# Patient Record
Sex: Female | Born: 1965
Health system: Southern US, Community
[De-identification: ages and names within clinical notes are randomized; demographics above are authoritative.]

## PROBLEM LIST (undated history)

## (undated) DIAGNOSIS — I2510 Atherosclerotic heart disease of native coronary artery without angina pectoris: Secondary | ICD-10-CM

## (undated) DIAGNOSIS — N611 Abscess of the breast and nipple: Secondary | ICD-10-CM

## (undated) DIAGNOSIS — K219 Gastro-esophageal reflux disease without esophagitis: Secondary | ICD-10-CM

## (undated) DIAGNOSIS — G809 Cerebral palsy, unspecified: Secondary | ICD-10-CM

## (undated) DIAGNOSIS — Z87898 Personal history of other specified conditions: Secondary | ICD-10-CM

## (undated) DIAGNOSIS — R001 Bradycardia, unspecified: Secondary | ICD-10-CM

## (undated) DIAGNOSIS — R7303 Prediabetes: Secondary | ICD-10-CM

## (undated) DIAGNOSIS — E785 Hyperlipidemia, unspecified: Secondary | ICD-10-CM

## (undated) DIAGNOSIS — I10 Essential (primary) hypertension: Secondary | ICD-10-CM

## (undated) DIAGNOSIS — R079 Chest pain, unspecified: Secondary | ICD-10-CM

## (undated) DIAGNOSIS — F319 Bipolar disorder, unspecified: Secondary | ICD-10-CM

## (undated) HISTORY — DX: Essential (primary) hypertension: I10

## (undated) HISTORY — PX: OTHER SURGICAL HISTORY: SHX169

## (undated) HISTORY — PX: CORONARY ANGIOPLASTY WITH STENT PLACEMENT: SHX49

## (undated) HISTORY — DX: Cerebral palsy, unspecified: G80.9

## (undated) HISTORY — DX: Hyperlipidemia, unspecified: E78.5

## (undated) HISTORY — PX: TUBAL LIGATION: SHX77

## (undated) HISTORY — DX: Bipolar disorder, unspecified: F31.9

## (undated) HISTORY — DX: Personal history of other specified conditions: Z87.898

## (undated) HISTORY — DX: Gastro-esophageal reflux disease without esophagitis: K21.9

## (undated) HISTORY — DX: Atherosclerotic heart disease of native coronary artery without angina pectoris: I25.10

---
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
## 1998-08-18 ENCOUNTER — Encounter: Payer: Self-pay | Admitting: Emergency Medicine

## 1998-08-18 ENCOUNTER — Emergency Department (HOSPITAL_COMMUNITY): Admission: EM | Admit: 1998-08-18 | Discharge: 1998-08-18 | Payer: Self-pay | Admitting: Emergency Medicine

## 1999-05-17 ENCOUNTER — Encounter: Payer: Self-pay | Admitting: *Deleted

## 1999-05-17 ENCOUNTER — Emergency Department (HOSPITAL_COMMUNITY): Admission: EM | Admit: 1999-05-17 | Discharge: 1999-05-17 | Payer: Self-pay | Admitting: Emergency Medicine

## 2000-07-26 ENCOUNTER — Emergency Department (HOSPITAL_COMMUNITY): Admission: EM | Admit: 2000-07-26 | Discharge: 2000-07-26 | Payer: Self-pay | Admitting: Emergency Medicine

## 2000-07-26 ENCOUNTER — Encounter: Payer: Self-pay | Admitting: Emergency Medicine

## 2001-08-12 ENCOUNTER — Encounter: Payer: Self-pay | Admitting: Internal Medicine

## 2001-08-12 ENCOUNTER — Encounter: Admission: RE | Admit: 2001-08-12 | Discharge: 2001-08-12 | Payer: Self-pay | Admitting: Hematology and Oncology

## 2001-11-11 ENCOUNTER — Encounter: Payer: Self-pay | Admitting: Obstetrics and Gynecology

## 2001-11-11 ENCOUNTER — Ambulatory Visit (HOSPITAL_COMMUNITY): Admission: RE | Admit: 2001-11-11 | Discharge: 2001-11-11 | Payer: Self-pay | Admitting: Obstetrics and Gynecology

## 2001-12-30 ENCOUNTER — Encounter (INDEPENDENT_AMBULATORY_CARE_PROVIDER_SITE_OTHER): Payer: Self-pay | Admitting: Specialist

## 2001-12-30 ENCOUNTER — Ambulatory Visit (HOSPITAL_COMMUNITY): Admission: RE | Admit: 2001-12-30 | Discharge: 2001-12-30 | Payer: Self-pay | Admitting: Obstetrics and Gynecology

## 2003-03-10 HISTORY — PX: TOTAL ABDOMINAL HYSTERECTOMY: SHX209

## 2003-03-31 ENCOUNTER — Other Ambulatory Visit: Admission: RE | Admit: 2003-03-31 | Discharge: 2003-03-31 | Payer: Self-pay | Admitting: Obstetrics and Gynecology

## 2003-04-07 ENCOUNTER — Inpatient Hospital Stay (HOSPITAL_COMMUNITY): Admission: RE | Admit: 2003-04-07 | Discharge: 2003-04-09 | Payer: Self-pay | Admitting: Obstetrics and Gynecology

## 2003-04-07 ENCOUNTER — Encounter (INDEPENDENT_AMBULATORY_CARE_PROVIDER_SITE_OTHER): Payer: Self-pay | Admitting: *Deleted

## 2003-06-13 ENCOUNTER — Emergency Department (HOSPITAL_COMMUNITY): Admission: EM | Admit: 2003-06-13 | Discharge: 2003-06-13 | Payer: Self-pay | Admitting: Emergency Medicine

## 2004-09-30 ENCOUNTER — Emergency Department (HOSPITAL_COMMUNITY): Admission: EM | Admit: 2004-09-30 | Discharge: 2004-09-30 | Payer: Self-pay | Admitting: Emergency Medicine

## 2008-02-07 DIAGNOSIS — I2510 Atherosclerotic heart disease of native coronary artery without angina pectoris: Secondary | ICD-10-CM

## 2008-02-07 HISTORY — DX: Atherosclerotic heart disease of native coronary artery without angina pectoris: I25.10

## 2008-02-17 ENCOUNTER — Ambulatory Visit: Payer: Self-pay | Admitting: Internal Medicine

## 2008-02-17 ENCOUNTER — Inpatient Hospital Stay (HOSPITAL_COMMUNITY): Admission: EM | Admit: 2008-02-17 | Discharge: 2008-02-19 | Payer: Self-pay | Admitting: Emergency Medicine

## 2008-03-12 ENCOUNTER — Ambulatory Visit: Payer: Self-pay | Admitting: Internal Medicine

## 2008-04-02 ENCOUNTER — Inpatient Hospital Stay (HOSPITAL_COMMUNITY): Admission: EM | Admit: 2008-04-02 | Discharge: 2008-04-02 | Payer: Self-pay | Admitting: Emergency Medicine

## 2008-04-02 ENCOUNTER — Ambulatory Visit: Payer: Self-pay | Admitting: Cardiology

## 2008-09-02 ENCOUNTER — Emergency Department (HOSPITAL_COMMUNITY): Admission: EM | Admit: 2008-09-02 | Discharge: 2008-09-02 | Payer: Self-pay | Admitting: Emergency Medicine

## 2008-10-26 ENCOUNTER — Emergency Department (HOSPITAL_COMMUNITY): Admission: EM | Admit: 2008-10-26 | Discharge: 2008-10-26 | Payer: Self-pay | Admitting: Emergency Medicine

## 2009-07-02 ENCOUNTER — Ambulatory Visit: Payer: Self-pay | Admitting: Cardiology

## 2009-07-02 ENCOUNTER — Observation Stay (HOSPITAL_COMMUNITY): Admission: EM | Admit: 2009-07-02 | Discharge: 2009-07-03 | Payer: Self-pay | Admitting: Emergency Medicine

## 2009-08-19 ENCOUNTER — Telehealth (INDEPENDENT_AMBULATORY_CARE_PROVIDER_SITE_OTHER): Payer: Self-pay | Admitting: *Deleted

## 2009-09-09 ENCOUNTER — Telehealth (INDEPENDENT_AMBULATORY_CARE_PROVIDER_SITE_OTHER): Payer: Self-pay

## 2009-09-13 ENCOUNTER — Ambulatory Visit: Payer: Self-pay | Admitting: Cardiology

## 2009-09-13 ENCOUNTER — Ambulatory Visit: Payer: Self-pay

## 2009-09-13 ENCOUNTER — Encounter (HOSPITAL_COMMUNITY): Admission: RE | Admit: 2009-09-13 | Discharge: 2009-10-08 | Payer: Self-pay | Admitting: Internal Medicine

## 2009-09-21 DIAGNOSIS — I10 Essential (primary) hypertension: Secondary | ICD-10-CM

## 2009-10-21 ENCOUNTER — Encounter (INDEPENDENT_AMBULATORY_CARE_PROVIDER_SITE_OTHER): Payer: Self-pay | Admitting: *Deleted

## 2009-12-01 ENCOUNTER — Ambulatory Visit: Payer: Self-pay | Admitting: Internal Medicine

## 2009-12-01 ENCOUNTER — Encounter: Payer: Self-pay | Admitting: Internal Medicine

## 2009-12-01 DIAGNOSIS — G809 Cerebral palsy, unspecified: Secondary | ICD-10-CM

## 2009-12-01 DIAGNOSIS — I2510 Atherosclerotic heart disease of native coronary artery without angina pectoris: Secondary | ICD-10-CM | POA: Insufficient documentation

## 2009-12-01 DIAGNOSIS — K219 Gastro-esophageal reflux disease without esophagitis: Secondary | ICD-10-CM | POA: Insufficient documentation

## 2009-12-01 DIAGNOSIS — F319 Bipolar disorder, unspecified: Secondary | ICD-10-CM | POA: Insufficient documentation

## 2009-12-01 DIAGNOSIS — E785 Hyperlipidemia, unspecified: Secondary | ICD-10-CM | POA: Insufficient documentation

## 2009-12-02 DIAGNOSIS — E559 Vitamin D deficiency, unspecified: Secondary | ICD-10-CM | POA: Insufficient documentation

## 2009-12-02 LAB — CONVERTED CEMR LAB
ALT: 9 U/L
AST: 13 U/L
Albumin: 5 g/dL
Alkaline Phosphatase: 81 U/L
BUN: 7 mg/dL
Basophils Absolute: 0 K/uL
Basophils Relative: 1 %
CO2: 25 meq/L
Calcium: 9.9 mg/dL
Chloride: 104 meq/L
Cholesterol: 200 mg/dL
Creatinine, Ser: 0.79 mg/dL
Eosinophils Absolute: 0.1 K/uL
Eosinophils Relative: 1 %
Glucose, Bld: 81 mg/dL
HCT: 43.4 %
HDL: 51 mg/dL
Hemoglobin: 14.5 g/dL
LDL Cholesterol: 130 mg/dL — ABNORMAL HIGH
Lymphocytes Relative: 55 % — ABNORMAL HIGH
Lymphs Abs: 3.6 K/uL
MCHC: 33.4 g/dL
MCV: 91.8 fL
Monocytes Absolute: 0.6 K/uL
Monocytes Relative: 8 %
Neutro Abs: 2.3 K/uL
Neutrophils Relative %: 35 % — ABNORMAL LOW
Platelets: 280 K/uL
Potassium: 4 meq/L
RBC: 4.73 M/uL
RDW: 14.6 %
Sodium: 140 meq/L
TSH: 0.692 u[IU]/mL
Total Bilirubin: 0.4 mg/dL
Total CHOL/HDL Ratio: 3.9
Total Protein: 8 g/dL
Triglycerides: 96 mg/dL
VLDL: 19 mg/dL
Vit D, 25-Hydroxy: <10 ng/mL — ABNORMAL LOW
WBC: 6.5 10*3/microliter

## 2009-12-22 ENCOUNTER — Ambulatory Visit (HOSPITAL_COMMUNITY): Admission: RE | Admit: 2009-12-22 | Discharge: 2009-12-22 | Payer: Self-pay | Admitting: Internal Medicine

## 2009-12-22 LAB — HM MAMMOGRAPHY

## 2010-03-14 ENCOUNTER — Ambulatory Visit: Payer: Self-pay | Admitting: Internal Medicine

## 2010-03-14 LAB — CONVERTED CEMR LAB
ALT: 13 units/L (ref 0–35)
AST: 13 units/L (ref 0–37)
Albumin: 4.9 g/dL (ref 3.5–5.2)
Alkaline Phosphatase: 84 units/L (ref 39–117)
BUN: 8 mg/dL (ref 6–23)
CO2: 24 meq/L (ref 19–32)
Calcium: 10.3 mg/dL (ref 8.4–10.5)
Chloride: 102 meq/L (ref 96–112)
Cholesterol: 217 mg/dL — ABNORMAL HIGH (ref 0–200)
Creatinine, Ser: 0.66 mg/dL (ref 0.40–1.20)
Glucose, Bld: 87 mg/dL (ref 70–99)
HDL: 47 mg/dL (ref >39)
LDL Cholesterol: 152 mg/dL — ABNORMAL HIGH (ref 0–99)
Potassium: 4.1 meq/L (ref 3.5–5.3)
Sodium: 137 meq/L (ref 135–145)
Total Bilirubin: 0.4 mg/dL (ref 0.3–1.2)
Total CHOL/HDL Ratio: 4.6
Total Protein: 7.9 g/dL (ref 6.0–8.3)
Triglycerides: 92 mg/dL (ref <150)
VLDL: 18 mg/dL (ref 0–40)
Vit D, 25-Hydroxy: 25 ng/mL — ABNORMAL LOW (ref 30–89)

## 2010-03-22 ENCOUNTER — Encounter: Payer: Self-pay | Admitting: Internal Medicine

## 2010-03-22 ENCOUNTER — Telehealth: Payer: Self-pay | Admitting: Licensed Clinical Social Worker

## 2010-04-10 IMAGING — CR DG CHEST 2V
2 series · 2 of 2 positions shown · non-contrast
Comparison: Report of a prior exam dated 05/17/1999

CLINICAL DATA: Left chest pain

CHEST - 2 VIEW

[w chest pa]
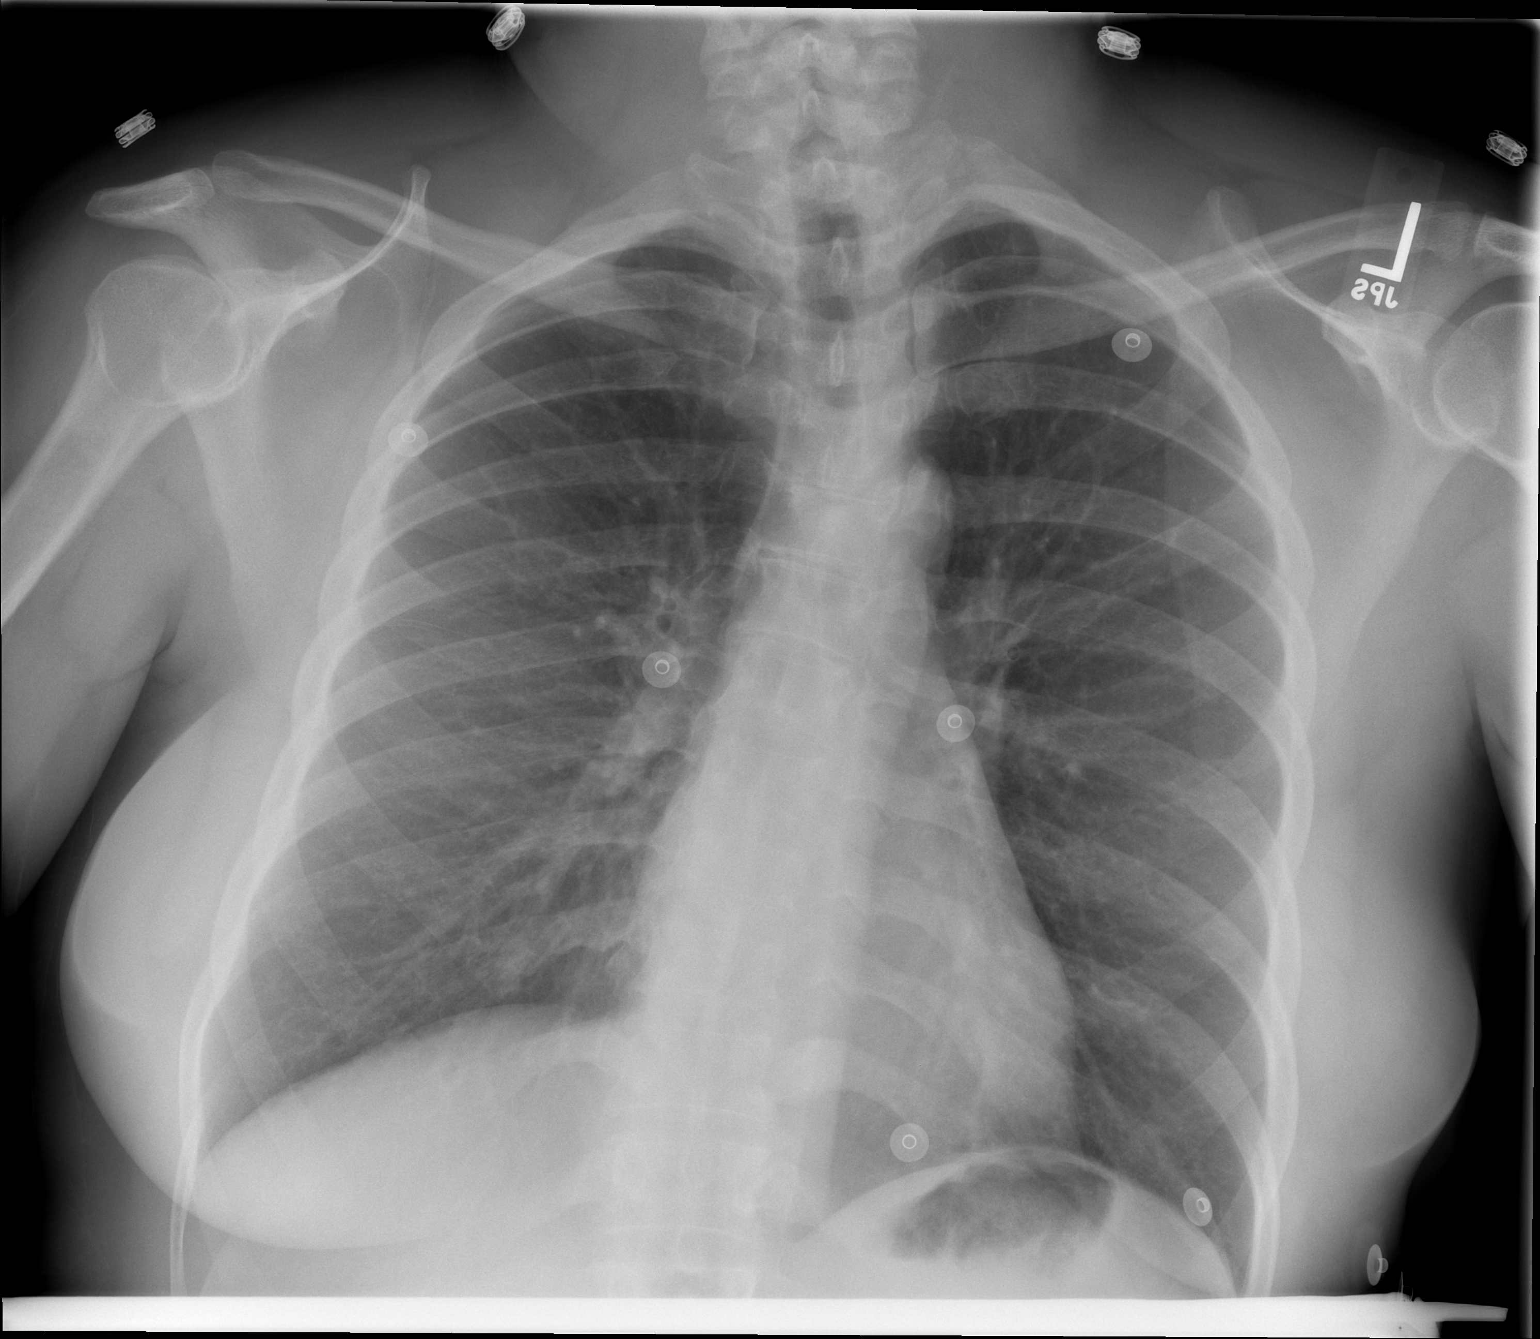

[w chest lat]
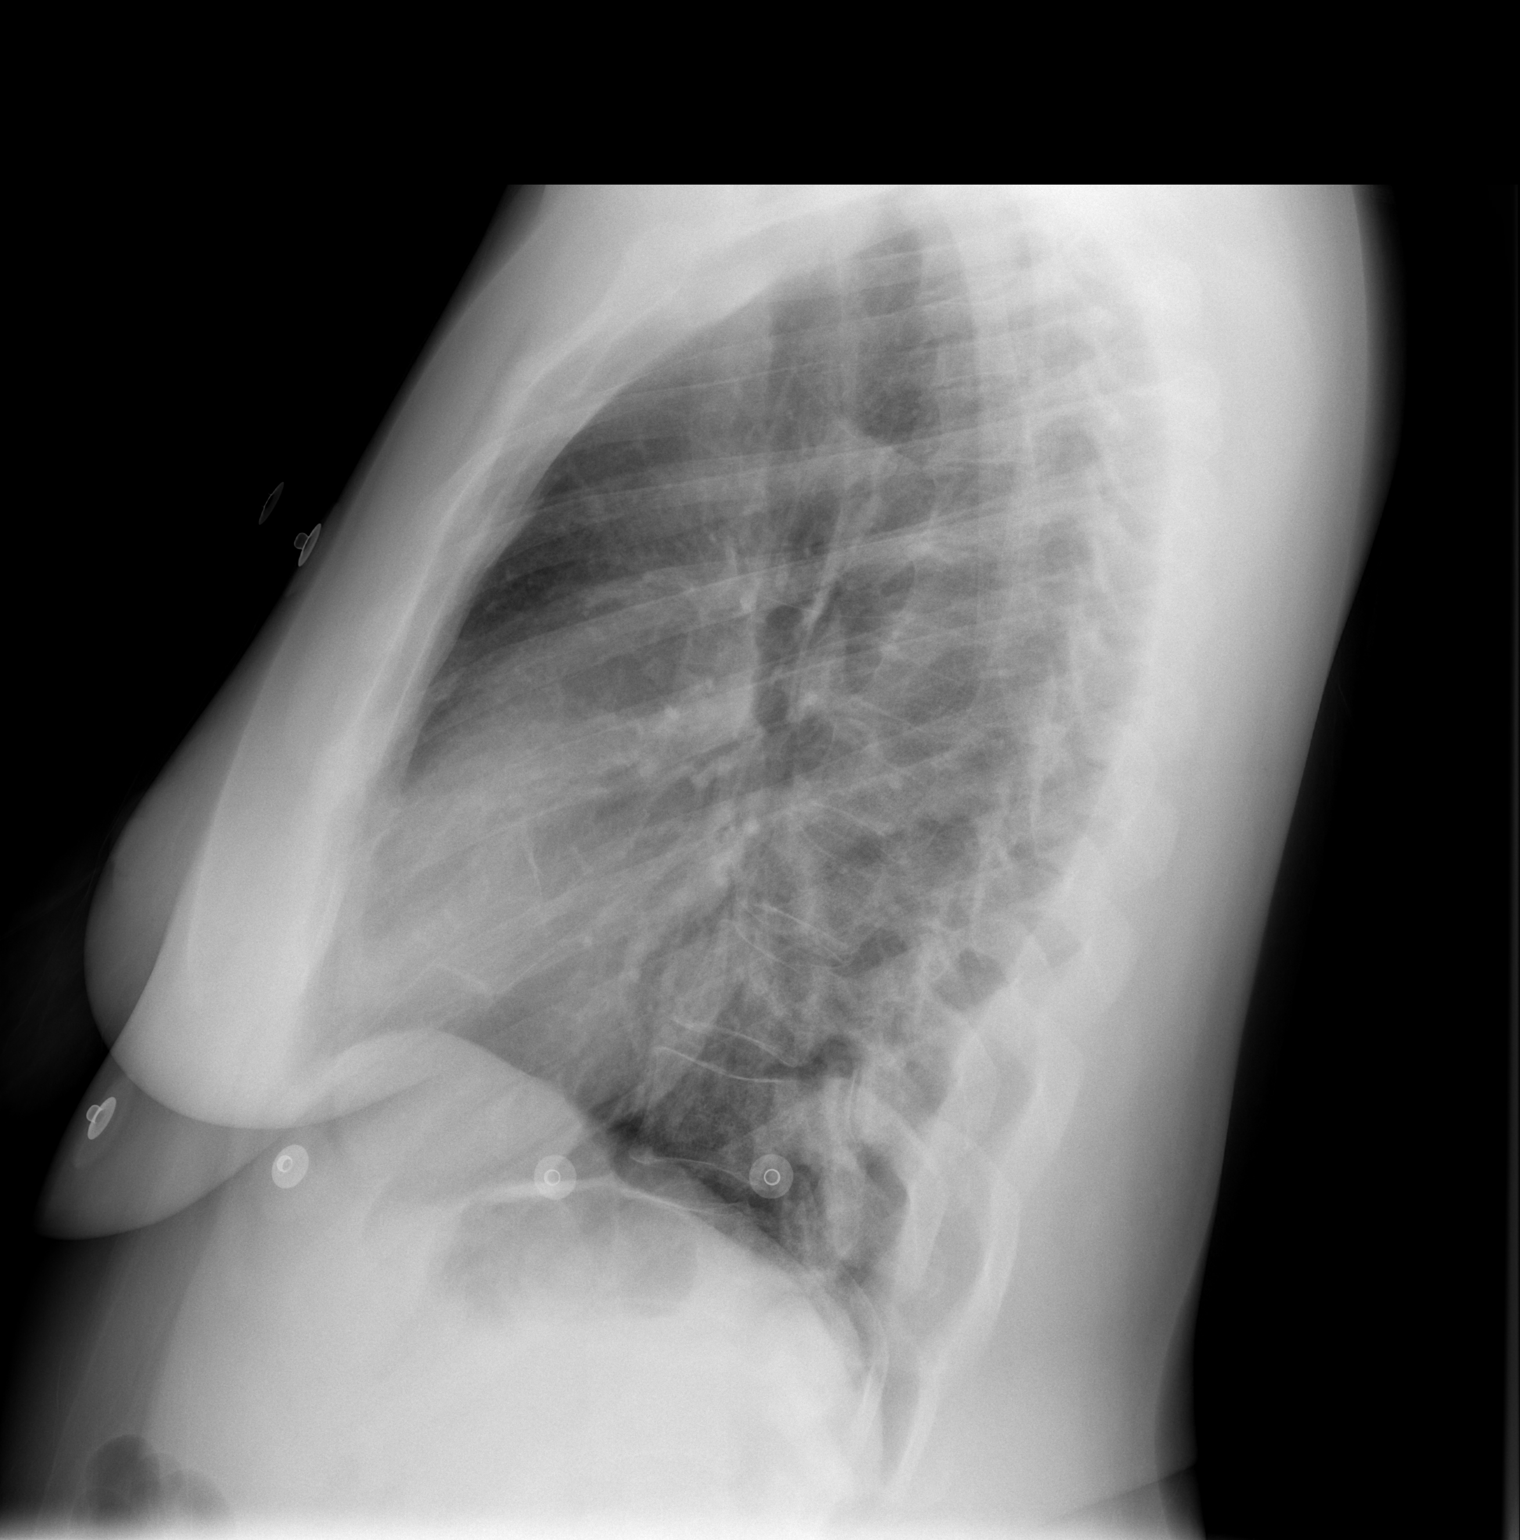

[2 of 2 positions shown; findings below may reference images not displayed]

FINDINGS: Heart and lungs are normal.  There is biconcave scoliosis
of the thoracolumbar spine.  No pleural fluid.
IMPRESSION: Scoliosis - no active disease.

## 2010-05-10 ENCOUNTER — Emergency Department (HOSPITAL_COMMUNITY): Admission: EM | Admit: 2010-05-10 | Discharge: 2010-05-10 | Payer: Self-pay | Admitting: Emergency Medicine

## 2010-06-15 ENCOUNTER — Encounter: Payer: Self-pay | Admitting: Internal Medicine

## 2010-11-08 NOTE — Letter (Signed)
Summary: [employer]   Imported By: Florinda Marker 12/02/2009 13:56:53  _____________________________________________________________________  External Attachment:    Type:   Image     Comment:   External Document

## 2010-11-08 NOTE — Assessment & Plan Note (Signed)
Summary: EST-CK/FU/MEDS/CFB   Vital Signs:  Patient profile:   45 year old female Height:      64 inches (162.56 cm) Weight:      171.2 pounds (77.82 kg) BMI:     29.49 Temp:     97.5 degrees F (36.39 degrees C) oral Pulse rate:   63 / minute BP sitting:   139 / 91  (left arm) Cuff size:   regular  Vitals Entered By: Theotis Barrio NT II (March 14, 2010 2:35 PM) CC: ROUTINE OFFICE VISIT /  MEDICATION REFILL ( 3 [facility]) Is Patient Diabetic? No Pain Assessment Patient in pain? yes     Location: L/SHOULDER - LEGS Intensity:      7 Type:     THROBS Onset of pain  Chronic Nutritional Status BMI of 25 - 29 = overweight  Have you ever been in a relationship where you felt threatened, hurt or afraid?No   Does patient need assistance? Functional Status Self care Ambulation Normal Comments ROUTINE OFFICIE VISIT   /   MEDICATION    Primary Care Provider:  Vassie Loll MD  CC:  ROUTINE OFFICE VISIT /  MEDICATION REFILL ( 3 [facility]).  History of Present Illness: 45 y/o female with pmh as described on the EMR; who comes the clinic forfollowup of her chronic problems. She is currently complaining of pain on her left shoulder and legs (which is chronic and secondary to her cerebral palsy).  She denies chest pain, SOB, palpitations, nausea, vomiting, abdominal pain, HA's, blurred vision or any other complaints.   She reports to be compliant with her medications, but admitted to missed some days to low incomes to get her medications on time and is also following a healthier diet.  Her BP today is 139/91 and will also need medications refill. Patient is still somking.  Preventive Screening-Counseling & Management  Alcohol-Tobacco     Smoking Status: current     Smoking Cessation Counseling: yes     Packs/Day: 1.0  Caffeine-Diet-Exercise     Does Patient Exercise: no  Problems Prior to Update: 1)  Vitamin D Deficiency  (ICD-268.9) 2)  Cerebral Palsy  (ICD-343.9) 3)  Gerd   (ICD-530.81) 4)  Bipolar Disorder Unspecified  (ICD-296.80) 5)  Hyperlipidemia  (ICD-272.4) 6)  C A D  (ICD-414.00) 7)  Hypertension, Unspecified  (ICD-401.9) 8)  Cad, Native Vessel  (ICD-414.01)  Current Problems (verified): 1)  Vitamin D Deficiency  (ICD-268.9) 2)  Cerebral Palsy  (ICD-343.9) 3)  Gerd  (ICD-530.81) 4)  Bipolar Disorder Unspecified  (ICD-296.80) 5)  Hyperlipidemia  (ICD-272.4) 6)  C A D  (ICD-414.00) 7)  Hypertension, Unspecified  (ICD-401.9) 8)  Cad, Native Vessel  (ICD-414.01)  Medications Prior to Update: 1)  Aspirin Ec 325 Mg Tbec (Aspirin) .... Take One Tablet By Mouth Daily 2)  Plavix 75 Mg Tabs (Clopidogrel Bisulfate) .... Take One Tablet By Mouth Daily 3)  Metoprolol Succinate 25 Mg Xr24h-Tab (Metoprolol Succinate) .... Take One Tablet By Mouth Daily 4)  Pravachol 40 Mg Tabs (Pravastatin Sodium) .... Take 1 Tablet By Mouth Once A Day 5)  Nitrostat 0.4 Mg Subl (Nitroglycerin) .Marland Kitchen.. 1 Tablet Under Tongue At Onset of Chest Pain; You May Repeat Every 5 Minutes For Up To 3 Doses. 6)  Citalopram Hydrobromide 20 Mg Tabs (Citalopram Hydrobromide) .... Once Daily 7)  Ambien 10 Mg Tabs (Zolpidem Tartrate) .... As Needed 8)  Pepcid 40 Mg Tabs (Famotidine) .... Take 1 Tablet By Mouth Two Times  A Day 9)  Trazodone Hcl 100 Mg Tabs (Trazodone Hcl) .... Take 1 Tab By Mouth At Bedtime 10)  Tramadol Hcl 50 Mg Tabs (Tramadol Hcl) .... Take 1 Tablet By Mouth Every 8 Hours As Needed For Pain. 11)  Gabapentin 300 Mg Caps (Gabapentin) .... Take 1 Capsule By Mouth Two Times A Day 12)  Drisdol 04540 Unit Caps (Ergocalciferol) .... Take 1 Capsule By Mouth Once A Week.  Current Medications (verified): 1)  Aspirin Ec 325 Mg Tbec (Aspirin) .... Take One Tablet By Mouth Daily 2)  Plavix 75 Mg Tabs (Clopidogrel Bisulfate) .... Take One Tablet By Mouth Daily 3)  Metoprolol Succinate 25 Mg Xr24h-Tab (Metoprolol Succinate) .... Take One Tablet By Mouth Daily 4)  Pravachol 40 Mg Tabs  (Pravastatin Sodium) .... Take 1 Tablet By Mouth Once A Day 5)  Nitrostat 0.4 Mg Subl (Nitroglycerin) .Marland Kitchen.. 1 Tablet Under Tongue At Onset of Chest Pain; You May Repeat Every 5 Minutes For Up To 3 Doses. 6)  Citalopram Hydrobromide 20 Mg Tabs (Citalopram Hydrobromide) .... Once Daily 7)  Ambien 10 Mg Tabs (Zolpidem Tartrate) .... As Needed 8)  Pepcid 40 Mg Tabs (Famotidine) .... Take 1 Tablet By Mouth Two Times A Day 9)  Trazodone Hcl 100 Mg Tabs (Trazodone Hcl) .... Take 1 Tab By Mouth At Bedtime 10)  Tramadol Hcl 50 Mg Tabs (Tramadol Hcl) .... Take 1 Tablet By Mouth Every 8 Hours As Needed For Pain. 11)  Gabapentin 300 Mg Caps (Gabapentin) .... Take 1 Capsule By Mouth Two Times A Day 12)  Drisdol 98119 Unit Caps (Ergocalciferol) .... Take 1 Capsule By Mouth Once A Week.  Allergies (verified): 1)  ! Penicillin  Review of Systems       As per HPI.  Physical Exam  General:  alert, well-developed, and well-hydrated.  Patient sitting on a wheelchair. Lungs:  normal respiratory effort, no intercostal retractions, no accessory muscle use, no crackles, and no wheezes.   Heart:  normal rate, regular rhythm, no gallop, and no rub.   Abdomen:  soft, non-tender, normal bowel sounds, no guarding, and no rigidity.   Msk:  mild athrophy on her leg muscle, normal range of motion, no joint swelling, no redness and no warmth. Extremities:  No edema, no cyanosis. Neurologic:  alert & oriented X3 and cranial nerves II-XII intact.  MS 4/5 bilaterally (upper and lower extremeties), normal sensation. umbalance gait (chronically). Today using a wheelchair.   Impression & Recommendations:  Problem # 1:  VITAMIN D DEFICIENCY (ICD-268.9)  She finished acute repletion period w/o problemsor side effects. will check vit D level and determine if she will need a longer period of high dose vit D or if would be ok for daily vit D and calcium daily supplementation.  Orders: T-Vitamin D (25-Hydroxy)  [phone])  Problem # 2:  CEREBRAL PALSY (ICD-343.9) Will start baclofen for pain and spasticy on her muscles. She will also need a scooter when her place is ready for it in order to help with mobility. Will continue with as needed tramadol and will arrange visit with Normagene T.in order to provide further needs.  Orders: Social Work Referral (Social )  Problem # 3:  C A D (ICD-414.00) No further CP, palpitations or dyspnea on exertion. Will continue plavix, metoprolol, aspirin and as needed nitrostat. Patient advised to follow a low fat diet and to be compliant with cholesterol medication.  Her updated medication list for this problem includes:    Aspirin Ec 325 Mg  Tbec (Aspirin) .Marland Kitchen... Take one tablet by mouth daily    Plavix 75 Mg Tabs (Clopidogrel bisulfate) .Marland Kitchen... Take one tablet by mouth daily    Metoprolol Succinate 25 Mg Xr24h-tab (Metoprolol succinate) .Marland Kitchen... Take one tablet by mouth daily    Nitrostat 0.4 Mg Subl (Nitroglycerin) .Marland Kitchen... 1 tablet under tongue at onset of chest pain; you may repeat every 5 minutes for up to 3 doses.  Problem # 4:  HYPERTENSION, UNSPECIFIED (ICD-401.9)  Excellent control with current regimen. Will continue same medication; will check renal function and electrolytes and will recommend to her to follow a low sodium diet.  Her updated medication list for this problem includes:    Metoprolol Succinate 25 Mg Xr24h-tab (Metoprolol succinate) .Marland Kitchen... Take one tablet by mouth daily  Orders: T-Comprehensive Metabolic Panel ([id number])  Problem # 5:  GERD (ICD-530.81) Well controlled and stable. Will continue using pepcid 40mg  daily and will advised to follow lifestyle modification instructions.  Her updated medication list for this problem includes:    Pepcid 40 Mg Tabs (Famotidine) .Marland Kitchen... Take 1 tablet by mouth two times a day  Problem # 6:  HYPERLIPIDEMIA (ICD-272.4)  Will check lipid profile today and will adjust her statins as needed. Patient reports  not to be 100% compliant with her statins due to low income; but she has been following a healthier diet. Will check results and determine best therapeutic approach.  Her updated medication list for this problem includes:    Pravachol 40 Mg Tabs (Pravastatin sodium) .Marland Kitchen... Take 1 tablet by mouth once a day  Orders: T-Lipid Profile ([id number])  Complete Medication List: 1)  Aspirin Ec 325 Mg Tbec (Aspirin) .... Take one tablet by mouth daily 2)  Plavix 75 Mg Tabs (Clopidogrel bisulfate) .... Take one tablet by mouth daily 3)  Metoprolol Succinate 25 Mg Xr24h-tab (Metoprolol succinate) .... Take one tablet by mouth daily 4)  Pravachol 40 Mg Tabs (Pravastatin sodium) .... Take 1 tablet by mouth once a day 5)  Nitrostat 0.4 Mg Subl (Nitroglycerin) .Marland Kitchen.. 1 tablet under tongue at onset of chest pain; you may repeat every 5 minutes for up to 3 doses. 6)  Citalopram Hydrobromide 20 Mg Tabs (Citalopram hydrobromide) .... Once daily 7)  Ambien 10 Mg Tabs (Zolpidem tartrate) .... As needed 8)  Pepcid 40 Mg Tabs (Famotidine) .... Take 1 tablet by mouth two times a day 9)  Trazodone Hcl 100 Mg Tabs (Trazodone hcl) .... Take 1 tab by mouth at bedtime 10)  Tramadol Hcl 50 Mg Tabs (Tramadol hcl) .... Take 1 tablet by mouth every 8 hours as needed for pain. 11)  Gabapentin 300 Mg Caps (Gabapentin) .... Take 1 capsule by mouth two times a day 12)  Drisdol 24401 Unit Caps (Ergocalciferol) .... Take 1 capsule by mouth once a week. 13)  Baclofen 10 Mg Tabs (Baclofen) .... Take 1/2 tablet by mouth every 8 hours for spasticity.  Patient Instructions: 1)  Please schedule a follow-up appointment in 3-4 months, sooner if abnormalities seen on your results today.Marland Kitchen 2)  Take your medications as prescribed. 3)  You will be called with any abnormalities in the tests scheduled or performed today.  If you don't hear from [place] within a week from when the test was performed, you can assume that your test was normal. 4)  Avoid  foods high in acid (tomatoes, citrus juices, spicy foods). Avoid eating within two hours of lying down or before exercising. Do not over eat; try smaller more frequent meals. Elevate  head of bed twelve inches when sleeping. 5)  Follow a low sodium diet (less than 2500 mg daily) 6)  Tobacco is very bad for your health and your loved ones! You Should stop smoking!. 7)  Stop Smoking Tips: Choose a Quit date. Cut down before the Quit date. decide what you will do as a substitute when you feel the urge to smoke(gum, toothpick, exercise). 8)  Follow a low fat diet. 9)  Start taking a multivitamin daily. (aprat from your weekly dose of vit D). Prescriptions: GABAPENTIN 300 MG CAPS (GABAPENTIN) Take 1 capsule by mouth two times a day  #62 x 3   Entered and Authorized by:   Vassie Loll MD   Signed by:   Vassie Loll MD on 03/16/2010   Method used:   Print then Give to Patient   RxID:   [id number] DRISDOL 14782 UNIT CAPS (ERGOCALCIFEROL) Take 1 capsule by mouth once a week.  #15 x 0   Entered and Authorized by:   Vassie Loll MD   Signed by:   Vassie Loll MD on 03/16/2010   Method used:   Print then Give to Patient   RxID:   [id number] TRAZODONE HCL 100 MG TABS (TRAZODONE HCL) Take 1 tab by mouth at bedtime  #31 x 3   Entered and Authorized by:   Vassie Loll MD   Signed by:   Vassie Loll MD on 03/16/2010   Method used:   Print then Give to Patient   RxID:   [id number] TRAMADOL HCL 50 MG TABS (TRAMADOL HCL) Take 1 tablet by mouth every 8 hours as needed for pain.  #45 x 1   Entered and Authorized by:   Vassie Loll MD   Signed by:   Vassie Loll MD on 03/16/2010   Method used:   Print then Give to Patient   RxID:   [id number] PEPCID 40 MG TABS (FAMOTIDINE) Take 1 tablet by mouth two times a day  #62 x 5   Entered and Authorized by:   Vassie Loll MD   Signed by:   Vassie Loll MD on 03/16/2010   Method used:   Print then Give to Patient   RxID:    [id number] CITALOPRAM HYDROBROMIDE 20 MG TABS (CITALOPRAM HYDROBROMIDE) once daily  #31 x 3   Entered and Authorized by:   Vassie Loll MD   Signed by:   Vassie Loll MD on 03/16/2010   Method used:   Print then Give to Patient   RxID:   [id number] PRAVACHOL 40 MG TABS (PRAVASTATIN SODIUM) Take 1 tablet by mouth once a day  #31 x 3   Entered and Authorized by:   Vassie Loll MD   Signed by:   Vassie Loll MD on 03/16/2010   Method used:   Print then Give to Patient   RxID:   [id number] METOPROLOL SUCCINATE 25 MG XR24H-TAB (METOPROLOL SUCCINATE) Take one tablet by mouth daily  #31 x 5   Entered and Authorized by:   Vassie Loll MD   Signed by:   Vassie Loll MD on 03/16/2010   Method used:   Print then Give to Patient   RxID:   [id number] PLAVIX 75 MG TABS (CLOPIDOGREL BISULFATE) Take one tablet by mouth daily  #31 x 5   Entered and Authorized by:   Vassie Loll MD   Signed by:   Vassie Loll MD on 03/16/2010   Method used:   Print then Give to Patient   RxID:  [id number] BACLOFEN 10 MG TABS (BACLOFEN) Take 1/2 tablet by mouth every 8 hours for spasticity.  #60 x 1   Entered and Authorized by:   Vassie Loll MD   Signed by:   Vassie Loll MD on 03/14/2010   Method used:   Print then Give to Patient   RxID:   [id number]  Process Orders Check Orders Results:     [facility]: Check successful Tests Sent for requisitioning (March 16, 2010 2:05 PM):     03/14/2010: [facility] -- T-Vitamin D (25-Hydroxy) [phone] (signed)     03/14/2010: [facility] -- T-Lipid Profile [phone] (signed)     03/14/2010: [facility] -- T-Comprehensive Metabolic Panel [phone] (signed)    Prevention & Chronic Care Immunizations   Influenza vaccine: Not documented   Influenza vaccine deferral: Refused  (12/01/2009)    Tetanus booster: Not documented   Td booster deferral:  Deferred  (12/01/2009)    Pneumococcal vaccine: Not documented  Other Screening   Pap smear: Not documented    Mammogram: No specific mammographic evidence of malignancy.         Negative - BI-RADS 1       (12/22/2009)   Mammogram action/deferral: Next screening mammogram is recommended in one year.  (12/22/2009)   Mammogram due: 01/2011   Smoking status: current  (03/14/2010)   Smoking cessation counseling: yes  (03/14/2010)  Lipids   Total Cholesterol: 200  (12/01/2009)   Lipid panel action/deferral: Lipid Panel ordered   LDL: 130  (12/01/2009)   LDL Direct: Not documented   HDL: 51  (12/01/2009)   Triglycerides: 96  (12/01/2009)    SGOT (AST): 13  (12/01/2009)   BMP action: Ordered   SGPT (ALT): 9  (12/01/2009) CMP ordered    Alkaline phosphatase: 81  (12/01/2009)   Total bilirubin: 0.4  (12/01/2009)  Hypertension   Last Blood Pressure: 139 / 91  (03/14/2010)   Serum creatinine: 0.79  (12/01/2009)   BMP action: Ordered   Serum potassium 4.0  (12/01/2009) CMP ordered   Self-Management Support :   Personal Goals (by the next clinic visit) :      Personal blood pressure goal: 140/90  (12/01/2009)     Personal LDL goal: 100  (12/01/2009)    Patient will work on the following items until the next clinic visit to reach self-care goals:     Medications and monitoring: take my medicines every day, bring all of my medications to every visit  (03/14/2010)     Eating: eat more vegetables, use fresh or frozen vegetables, eat foods that are low in salt, eat baked foods instead of fried foods, limit or avoid alcohol  (03/14/2010)    Hypertension self-management support: Resources for patients handout  (03/14/2010)    Lipid self-management support: Resources for patients handout  (03/14/2010)     Self-management comments: UNABLE TO WALK DISTANCE DUE TO MEDICAL REASON      Resource handout printed.

## 2010-11-08 NOTE — Progress Notes (Signed)
Summary: Soc. 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 Work Emergency planning/management officer of Call: I have contacted the patient who tells me that she needs to first get a ramp for front 8 steps and also 1 step in back.  She has a Engineer, agricultural.  The landlord will not make accommodations and so she is working with [employer] who says that the landlord by law must make some accommodations.  I also gave her the phone number for [facility] who has access to ramps.  I advised the patient that she likely would not qualify for PWC or scooter if she can get around via walker.  She will call me once she has step situation resolved and we can speak further about equipment needs.  Patient tells me she is not in any rush but concerned with mobility as she gets older and living with cerebral palsy.

## 2010-11-08 NOTE — Consult Note (Signed)
Summary: [facility]  [facility]   Imported By: Louretta Parma 07/14/2010 10:31:38  _____________________________________________________________________  External Attachment:    Type:   Image     Comment:   External Document

## 2010-11-08 NOTE — Miscellaneous (Signed)
[facility]   Imported By: Margie Billet 04/08/2010 10:15:16  _____________________________________________________________________  External Attachment:    Type:   Image     Comment:   External Document

## 2010-11-08 NOTE — Assessment & Plan Note (Signed)
Summary: NEW TO [facility]/BRINGING REC...   Vital Signs:  Patient profile:   45 year old female Height:      64 inches (162.56 cm) Weight:      167.5 pounds (76.14 kg) BMI:     28.86 Temp:     96.9 degrees F (36.06 degrees C) oral Pulse rate:   74 / minute BP sitting:   138 / 90  (right arm)  Vitals Entered By: Stanton Kidney Ditzler RN (December 01, 2009 11:07 AM) Is Patient Diabetic? No Pain Assessment Patient in pain? yes     Location: all over Intensity: 8 Type: severe Onset of pain  Hx CP Nutritional Status BMI of 25 - 29 = overweight Nutritional Status Detail appetite good  Have you ever been in a relationship where you felt threatened, hurt or afraid?denies   Does patient need assistance? Functional Status Self care Ambulation Wheelchair Comments Old - new pt. Refills on meds. Needs handicapped sticker and note for scat transportation.   History of Present Illness: 45 y/o female who is new to the clinic and is here today in order to established relation with PCP and to get refill on her medications. Patient is doing well in general and is just complaining of chronic generalized pain 2/2 CP.  Patient denies any recent CP, SOB, Abdominal pain, dirrhea or constipation, fever, chills, cough, or any other complaints.  Patient 's pmh is significant for: 1. HYPERTENSION. 2. CAD, NATIVE VESSEL.  3. Bipolar disorder. 4. History of small-bowel obstruction. 5. Cerebral palsy.  6. Ongoing tobacco abuse.  7. History of hypokalemia. 8. GERD. 9. HLD.    Depression History:      The patient denies a depressed mood most of the day and a diminished interest in her usual daily activities.        The patient denies that she feels like life is not worth living, denies that she wishes that she were dead, and denies that she has thought about ending her life.         Preventive Screening-Counseling & Management  Alcohol-Tobacco     Smoking Status:  current     Smoking Cessation Counseling: yes     Packs/Day: 1.0  Caffeine-Diet-Exercise     Does Patient Exercise: no  Problems Prior to Update: 1)  Gerd  (ICD-530.81) 2)  Bipolar Disorder Unspecified  (ICD-296.80) 3)  Hyperlipidemia  (ICD-272.4) 4)  C A D  (ICD-414.00) 5)  Hypertension, Unspecified  (ICD-401.9) 6)  Cad, Native Vessel  (ICD-414.01)  Medications Prior to Update: 1)  Aspirin Ec 325 Mg Tbec (Aspirin) .... Take One Tablet By Mouth Daily 2)  Plavix 75 Mg Tabs (Clopidogrel Bisulfate) .... Take One Tablet By Mouth Daily 3)  Metoprolol Succinate 25 Mg Xr24h-Tab (Metoprolol Succinate) .... Take One Tablet By Mouth Daily 4)  Lipitor 80 Mg Tabs (Atorvastatin Calcium) .... Take One Tablet By Mouth Daily. 5)  Nitrostat 0.4 Mg Subl (Nitroglycerin) .Marland Kitchen.. 1 Tablet Under Tongue At Onset of Chest Pain; You May Repeat Every 5 Minutes For Up To 3 Doses. 6)  Citalopram Hydrobromide 20 Mg Tabs (Citalopram Hydrobromide) .... Once Daily 7)  Lithium Carbonate 300 Mg Caps (Lithium Carbonate) .... 2 Caps Two Times A Day 8)  Ambien 10 Mg Tabs (Zolpidem Tartrate) .... As Needed  Current Medications (verified): 1)  Aspirin Ec 325 Mg Tbec (Aspirin) .... Take One Tablet By Mouth Daily 2)  Plavix 75 Mg Tabs (Clopidogrel Bisulfate) .... Take One Tablet By Mouth Daily  3)  Metoprolol Succinate 25 Mg Xr24h-Tab (Metoprolol Succinate) .... Take One Tablet By Mouth Daily 4)  Crestor 40 Mg Tabs (Rosuvastatin Calcium) .... Take 1 Tablet By Mouth Once A Day 5)  Nitrostat 0.4 Mg Subl (Nitroglycerin) .Marland Kitchen.. 1 Tablet Under Tongue At Onset of Chest Pain; You May Repeat Every 5 Minutes For Up To 3 Doses. 6)  Citalopram Hydrobromide 20 Mg Tabs (Citalopram Hydrobromide) .... Once Daily 7)  Ambien 10 Mg Tabs (Zolpidem Tartrate) .... As Needed 8)  Protonix 40 Mg Tbec (Pantoprazole Sodium) .... Take 1 Tablet By Mouth Once A Day 9)  Trazodone Hcl 100 Mg Tabs (Trazodone Hcl) .... Take 1 Tab By Mouth At  Bedtime  Allergies: 1)  ! Penicillin  Past History:  Past Medical History: Last updated: 09/21/2009 1. HYPERTENSION, UNSPECIFIED  2. CAD, NATIVE VESSEL  3. Bipolar disorder 4. History of small-bowel obstruction 5. Cerebral palsy.  6. Ongoing tobacco abuse.  7. History of hypokalemia 8. GERD  Past Surgical History: Last updated: 09/21/2009 orthopedic  Family History: Last updated: 09/21/2009 Family History of Coronary Artery Disease:   Social History: Last updated: 09/21/2009 Disabled  Divorced  Tobacco Use - Yes.  Alcohol Use - yes Regular Exercise - no Drug Use - no  Risk Factors: Exercise: no (12/01/2009)  Risk Factors: Smoking Status: current (12/01/2009) Packs/Day: 1.0 (12/01/2009)  Social History: Packs/Day:  1.0  Review of Systems  The patient denies anorexia, chest pain, peripheral edema, prolonged cough, headaches, hemoptysis, abdominal pain, melena, hematochezia, and severe indigestion/heartburn.    Physical Exam  General:  alert, well-developed, and well-hydrated.  Patient sitting on a wheelchair. Lungs:  normal respiratory effort, no intercostal retractions, no accessory muscle use, no crackles, and no wheezes.   Heart:  normal rate, regular rhythm, no gallop, and no rub.   Abdomen:  soft, non-tender, normal bowel sounds, no guarding, and no rigidity.   Msk:  mild athrophy on her leg muscle, normal range of motion, no joint swelling, no redness and no warmth. Extremities:  No edema Neurologic:  alert & oriented X3 and cranial nerves II-XII intact.  MS 4/5 bilaterally (upper and lower extremeties), normal sensation. umbalance gait (chronically)   Impression & Recommendations:  Problem # 1:  HYPERLIPIDEMIA (ICD-272.4) Patient supposed to be using crestor; but she ran out almost 3 months ago and will like to see ifwe can use something generic instead (since copay is to high). Will check lipid profile and will also check LFT's depending her labs  results will decide which one would be the best medication for her. She understand importance of medication, due to her CAD and is willing to pay copay if she needed. Patient advised to follow a low fat diet.  Her updated medication list for this problem includes:    Crestor 40 Mg Tabs (Rosuvastatin calcium) .Marland Kitchen... Take 1 tablet by mouth once a day  Orders: T-Lipid Profile ([id number])  Problem # 2:  HYPERTENSION, UNSPECIFIED (ICD-401.9) Patient using metoprolol 25mg  daily. BP is essentially at goal. Will refill her metoprolol and will advised her to follow a low sodium diet. Patient also instructed to stop smoking. Will check renal function, electrolytes status, CBC and TSH.  Her updated medication list for this problem includes:    Metoprolol Succinate 25 Mg Xr24h-tab (Metoprolol succinate) .Marland Kitchen... Take one tablet by mouth daily  Orders: T-Vitamin D (25-Hydroxy) [phone]) T-TSH [phone]) T-CBC w/Diff [phone])  Problem # 3:  BIPOLAR DISORDER UNSPECIFIED (ICD-296.80) Patient is following at the mental health and havingher  medications prescribed and adjusted over there. Will encourage her to continue good medication compliance and to keep her appointments. Patient mood is stable and she is deniying any outburst of depression or maniac symptoms.  Problem # 4:  C A D (ICD-414.00) stable. No CP or SOB reported. will continue modifying her risk factors and will also continue plavix, metoprolol and Aspirin. Patient will continue following with cardiology as well ([facility]). Patient advised tostop smoking.  Her updated medication list for this problem includes:    Aspirin Ec 325 Mg Tbec (Aspirin) .Marland Kitchen... Take one tablet by mouth daily    Plavix 75 Mg Tabs (Clopidogrel bisulfate) .Marland Kitchen... Take one tablet by mouth daily    Metoprolol Succinate 25 Mg Xr24h-tab (Metoprolol succinate) .Marland Kitchen... Take one tablet by mouth daily    Nitrostat 0.4 Mg Subl (Nitroglycerin) .Marland Kitchen... 1 tablet under tongue  at onset of chest pain; you may repeat every 5 minutes for up to 3 doses.  Problem # 5:  GERD (ICD-530.81) Due to cost and also interaction of PPI's and plavix; will change her refluxmedication to pepcid 40mg  two times a day and will provide instructions for lifestyle modification changes.   Her updated medication list for this problem includes:    Pepcid 40 Mg Tabs (Famotidine) .Marland Kitchen... Take 1 tablet by mouth two times a day  Problem # 6:  CEREBRAL PALSY (ICD-343.9) Patient with cerebral palsy and hx of chronic neuropathic pain. Will start treatment with neurontin and also tramadol as needed for pain control.  Complete Medication List: 1)  Aspirin Ec 325 Mg Tbec (Aspirin) .... Take one tablet by mouth daily 2)  Plavix 75 Mg Tabs (Clopidogrel bisulfate) .... Take one tablet by mouth daily 3)  Metoprolol Succinate 25 Mg Xr24h-tab (Metoprolol succinate) .... Take one tablet by mouth daily 4)  Crestor 40 Mg Tabs (Rosuvastatin calcium) .... Take 1 tablet by mouth once a day 5)  Nitrostat 0.4 Mg Subl (Nitroglycerin) .Marland Kitchen.. 1 tablet under tongue at onset of chest pain; you may repeat every 5 minutes for up to 3 doses. 6)  Citalopram Hydrobromide 20 Mg Tabs (Citalopram hydrobromide) .... Once daily 7)  Ambien 10 Mg Tabs (Zolpidem tartrate) .... As needed 8)  Pepcid 40 Mg Tabs (Famotidine) .... Take 1 tablet by mouth two times a day 9)  Trazodone Hcl 100 Mg Tabs (Trazodone hcl) .... Take 1 tab by mouth at bedtime 10)  Tramadol Hcl 50 Mg Tabs (Tramadol hcl) .... Take 1 tablet by mouth every 8 hours as needed for pain. 11)  Gabapentin 300 Mg Caps (Gabapentin) .... Take 1 capsule by mouth two times a day  Other Orders: T-Comprehensive Metabolic Panel ([id number]) Mammogram (Screening) (Mammo)  Patient Instructions: 1)  Please schedule a follow-up appointment in 3 months. 2)  Takeyour medications as prescribed. 3)  You will be called with any abnormalities in the tests scheduled or performed today.   If you don't hear from [place] within a week from when the test was performed, you can assume that your test was normal. 4)  Avoid foods high in acid (tomatoes, citrus juices, spicy foods). Avoid eating within two hours of lying down or before exercising. Do not over eat; try smaller more frequent meals. Elevate head of bed twelve inches when sleeping. 5)  Follow a low sodium diet (less than 2 Grams daily) 6)  Tobacco is very bad for your health and your loved ones! You Should stop smoking!. 7)  Stop Smoking Tips: Choose a Quit date. Cut  down before the Quit date. decide what you will do as a substitute when you feel the urge to smoke(gum,toothpick,exercise). 8)  Follow a low fat diet. Prescriptions: METOPROLOL SUCCINATE 25 MG XR24H-TAB (METOPROLOL SUCCINATE) Take one tablet by mouth daily  #31 x 3   Entered and Authorized by:   Vassie Loll MD   Signed by:   Vassie Loll MD on 12/01/2009   Method used:   Print then Give to Patient   RxID:   [id number] PLAVIX 75 MG TABS (CLOPIDOGREL BISULFATE) Take one tablet by mouth daily  #31 x 3   Entered and Authorized by:   Vassie Loll MD   Signed by:   Vassie Loll MD on 12/01/2009   Method used:   Print then Give to Patient   RxID:   [id number] GABAPENTIN 300 MG CAPS (GABAPENTIN) Take 1 capsule by mouth two times a day  #62 x 2   Entered and Authorized by:   Vassie Loll MD   Signed by:   Vassie Loll MD on 12/01/2009   Method used:   Print then Give to Patient   RxID:   [id number] TRAMADOL HCL 50 MG TABS (TRAMADOL HCL) Take 1 tablet by mouth every 8 hours as needed for pain.  #45 x 1   Entered and Authorized by:   Vassie Loll MD   Signed by:   Vassie Loll MD on 12/01/2009   Method used:   Print then Give to Patient   RxID:   [id number] PEPCID 40 MG TABS (FAMOTIDINE) Take 1 tablet by mouth two times a day  #62 x 3   Entered and Authorized by:   Vassie Loll MD   Signed by:   Vassie Loll MD on 12/01/2009   Method  used:   Print then Give to Patient   RxID:   [id number]  Process Orders Check Orders Results:     [facility]: Check successful Tests Sent for requisitioning (December 01, 2009 2:10 PM):     12/01/2009: [facility] -- T-Lipid Profile [phone] (signed)     12/01/2009: [facility] -- T-Comprehensive Metabolic Panel [[id number]] (signed)     12/01/2009: [facility] -- T-Vitamin D (25-Hydroxy) [phone] (signed)     12/01/2009: [facility] -- T-TSH [phone] (signed)     12/01/2009: [facility] -- T-CBC w/Diff [[id number]] (signed)    Prevention & Chronic Care Immunizations   Influenza vaccine: Not documented   Influenza vaccine deferral: Refused  (12/01/2009)    Tetanus booster: Not documented   Td booster deferral: Deferred  (12/01/2009)    Pneumococcal vaccine: Not documented  Other Screening   Pap smear: Not documented    Mammogram: Not documented   Mammogram action/deferral: Ordered  (12/01/2009)   Smoking status: current  (12/01/2009)   Smoking cessation counseling: yes  (12/01/2009)  Lipids   Total Cholesterol: Not documented   Lipid panel action/deferral: Lipid Panel ordered   LDL: Not documented   LDL Direct: Not documented   HDL: Not documented   Triglycerides: Not documented    SGOT (AST): Not documented   BMP action: Ordered   SGPT (ALT): Not documented CMP ordered    Alkaline phosphatase: Not documented   Total bilirubin: Not documented  Hypertension   Last Blood Pressure: 138 / 90  (12/01/2009)   Serum creatinine: Not documented   BMP action: Ordered   Serum potassium Not documented CMP ordered     Hypertension flowsheet reviewed?: Yes   Progress toward  BP goal: Improved  Self-Management Support :   Personal Goals (by the next clinic visit) :      Personal blood pressure goal: 140/90  (12/01/2009)     Personal LDL goal: 100   (12/01/2009)    Patient will work on the following items until the next clinic visit to reach self-care goals:     Medications and monitoring: take my medicines every day, bring all of my medications to every visit, weigh myself weekly  (12/01/2009)     Eating: eat more vegetables, use fresh or frozen vegetables, eat foods that are low in salt, eat baked foods instead of fried foods  (12/01/2009)    Hypertension self-management support: Written self-care plan, Education handout, Resources for patients handout  (12/01/2009)   Hypertension self-care plan printed.   Hypertension education handout printed    Lipid self-management support: Written self-care plan, Resources for patients handout  (12/01/2009)   Lipid self-care plan printed.      Resource handout printed.   Nursing Instructions: Schedule screening mammogram (see order)

## 2010-11-08 NOTE — Letter (Signed)
Summary: Appointment - Missed  [facility]     [place], [place]    Phone:   Fax:      October 21, 2009 MRN: [id number]   [facility] [address] [place], [place]  [address]   Dear Ms. Riebe,  Our records indicate you missed your appointment on  09-22-2009 with  Dr. Jens Som  It is very important that we reach you to reschedule this appointment. We look forward to participating in your health care needs. Please contact us at the number listed above at your earliest convenience to reschedule this appointment.     Sincerely,   Lorne Skeens  [facility] Scheduling Team

## 2010-11-11 NOTE — Miscellaneous (Signed)
Summary: HIPAA Restrictions  HIPAA Restrictions   Imported By: Florinda Marker 12/01/2009 14:24:55  _____________________________________________________________________  External Attachment:    Type:   Image     Comment:   External Document

## 2010-12-11 ENCOUNTER — Encounter: Payer: Self-pay | Admitting: Internal Medicine

## 2010-12-11 DIAGNOSIS — R1900 Intra-abdominal and pelvic swelling, mass and lump, unspecified site: Secondary | ICD-10-CM

## 2010-12-11 DIAGNOSIS — K219 Gastro-esophageal reflux disease without esophagitis: Secondary | ICD-10-CM

## 2010-12-11 DIAGNOSIS — E785 Hyperlipidemia, unspecified: Secondary | ICD-10-CM

## 2010-12-11 DIAGNOSIS — F319 Bipolar disorder, unspecified: Secondary | ICD-10-CM

## 2010-12-11 DIAGNOSIS — Z87898 Personal history of other specified conditions: Secondary | ICD-10-CM | POA: Insufficient documentation

## 2010-12-11 DIAGNOSIS — G809 Cerebral palsy, unspecified: Secondary | ICD-10-CM

## 2010-12-11 DIAGNOSIS — I2510 Atherosclerotic heart disease of native coronary artery without angina pectoris: Secondary | ICD-10-CM

## 2010-12-11 DIAGNOSIS — Z0000 Encounter for general adult medical examination without abnormal findings: Secondary | ICD-10-CM | POA: Insufficient documentation

## 2010-12-11 DIAGNOSIS — I10 Essential (primary) hypertension: Secondary | ICD-10-CM

## 2010-12-11 DIAGNOSIS — E559 Vitamin D deficiency, unspecified: Secondary | ICD-10-CM

## 2010-12-12 ENCOUNTER — Ambulatory Visit: Payer: Self-pay | Admitting: Internal Medicine

## 2010-12-22 ENCOUNTER — Encounter: Payer: Self-pay | Admitting: Internal Medicine

## 2010-12-22 ENCOUNTER — Ambulatory Visit (INDEPENDENT_AMBULATORY_CARE_PROVIDER_SITE_OTHER): Payer: MEDICARE | Admitting: Internal Medicine

## 2010-12-22 DIAGNOSIS — E785 Hyperlipidemia, unspecified: Secondary | ICD-10-CM

## 2010-12-22 DIAGNOSIS — E559 Vitamin D deficiency, unspecified: Secondary | ICD-10-CM

## 2010-12-22 DIAGNOSIS — K219 Gastro-esophageal reflux disease without esophagitis: Secondary | ICD-10-CM

## 2010-12-22 DIAGNOSIS — Z0000 Encounter for general adult medical examination without abnormal findings: Secondary | ICD-10-CM

## 2010-12-22 DIAGNOSIS — I10 Essential (primary) hypertension: Secondary | ICD-10-CM

## 2010-12-22 DIAGNOSIS — I2510 Atherosclerotic heart disease of native coronary artery without angina pectoris: Secondary | ICD-10-CM

## 2010-12-22 MED ORDER — METOPROLOL SUCCINATE ER 25 MG PO TB24: 25.0000 mg | ORAL_TABLET | Freq: Every day | ORAL | Status: DC

## 2010-12-22 MED ORDER — TRAMADOL HCL 50 MG PO TABS: 50.0000 mg | ORAL_TABLET | Freq: Three times a day (TID) | ORAL | Status: DC | PRN

## 2010-12-22 MED ORDER — FAMOTIDINE 40 MG PO TABS: 40.0000 mg | ORAL_TABLET | Freq: Two times a day (BID) | ORAL | Status: DC

## 2010-12-22 MED ORDER — PRAVASTATIN SODIUM 40 MG PO TABS: 40.0000 mg | ORAL_TABLET | Freq: Every day | ORAL | Status: DC

## 2010-12-22 MED ORDER — BACLOFEN 10 MG PO TABS: 5.0000 mg | ORAL_TABLET | Freq: Three times a day (TID) | ORAL | Status: DC | PRN

## 2010-12-22 NOTE — Assessment & Plan Note (Signed)
Uncontrolled 2/2 nonadherence. Will have patient restart medication and follow up.

## 2010-12-22 NOTE — Patient Instructions (Addendum)
Follow up with PCP. Take all medication as directed.

## 2010-12-22 NOTE — Assessment & Plan Note (Signed)
Restart statin, check FLP and cmet on follow up.

## 2010-12-22 NOTE — Assessment & Plan Note (Signed)
Stable  Continue current regimen  

## 2010-12-22 NOTE — Assessment & Plan Note (Signed)
Stable. Patient refuses Plavix due to financially insufficient funds. Will have her continue taking aspirin as directed.

## 2010-12-22 NOTE — Progress Notes (Signed)
  Subjective:    Patient ID: [name], female    DOB: April 08, 1966, 45 y.o.   MRN: [id number]  HPI  45 yr old woman with  Past Medical History  Diagnosis Date  . Coronary artery disease 02/2008    Followed by Dr. Milas Kocher. H/O inferior STEMI (02/2008) - s/p PICA and stenting of mid-circumflex with BMS // Heart cath showed  acute diaphragmatic wall infarction with total occlusion of mid circumflex artery, no major obstruction in LAD or small nondominant right coronary. Inferior basal wall hypokinesis.  // Eugenie Birks stress  test (09/2009) -  low risk , and no evidence of ischemia.  . Bipolar disorder      followed by mental health, who prescribe her bipolar meds  . Tobacco abuse   . Cerebral palsy      with history of neuropathic pain  and spasticity,  requiring long-term pain medications  . Hypertension   . GERD (gastroesophageal reflux disease)   . Vitamin D deficiency 11/2009     patient treated with high-dose vitamin D weekly x3 months,  levels were rechecked and it was determined that the patient continued to require high dose repletion of vitamin D.  . History of pelvic mass     Found on CT abd/ pelvis (04/2008) - 13.3 cm cystic pelvis mass, possibly left ovarian origin. To be followed at [facility].    comes to the clinic for refill of meds. Patient reports that she has ran out of all medications 2 months ago. He is not taking neurontin or plavix. Reports that plavix is not covered by insurance so she is not going to take it. Denies chest pain, palpitations, diaphoresis, shortness of breath, abdominal pain, hematochezia, melena, or fever/chills.  Review of Systems  All other systems reviewed and are negative.       Objective:   Physical Exam  Constitutional: She is oriented to person, place, and time. She appears well-developed and well-nourished. No distress.       On wheelchair.  HENT:  Mouth/Throat: Oropharynx is clear and moist.  Eyes: Conjunctivae and EOM are normal. Pupils  are equal, round, and reactive to light.  Neck: Normal range of motion. Neck supple.  Cardiovascular: Normal rate, regular rhythm and normal heart sounds.   Pulmonary/Chest: Effort normal and breath sounds normal.  Abdominal: Soft. Bowel sounds are normal.  Neurological: She is alert and oriented to person, place, and time.       MS 4/5 bilaterally (upper and lower extremeties), normal sensation. umbalance gait (chronically)  Skin: She is not diaphoretic.  Psychiatric: She has a normal mood and affect.          Assessment & Plan:

## 2010-12-22 NOTE — Assessment & Plan Note (Signed)
Instructed to schedule Mammogram. Would prefer to have Pap smear done by PCP.

## 2010-12-22 NOTE — Assessment & Plan Note (Signed)
Check 25 hydroxyvitamin D on follow up.

## 2011-01-13 LAB — TSH: TSH: 2.189 u[IU]/mL (ref 0.350–4.500)

## 2011-01-13 LAB — CBC
HCT: 43 % (ref 36.0–46.0)
Hemoglobin: 14.3 g/dL (ref 12.0–15.0)
MCHC: 33.2 g/dL (ref 30.0–36.0)
MCV: 94.1 fL (ref 78.0–100.0)
Platelets: 244 10*3/uL (ref 150–400)
RBC: 4.57 MIL/uL (ref 3.87–5.11)
RDW: 14.9 % (ref 11.5–15.5)
WBC: 9.9 10*3/uL (ref 4.0–10.5)

## 2011-01-13 LAB — HEPATIC FUNCTION PANEL
ALT: 15 U/L (ref 0–35)
AST: 22 U/L (ref 0–37)
Albumin: 4.3 g/dL (ref 3.5–5.2)
Alkaline Phosphatase: 90 U/L (ref 39–117)
Bilirubin, Direct: <0.1 mg/dL (ref 0.0–0.3)
Total Bilirubin: 0.6 mg/dL (ref 0.3–1.2)
Total Protein: 7.7 g/dL (ref 6.0–8.3)

## 2011-01-13 LAB — POCT I-STAT, CHEM 8
BUN: 4 mg/dL — ABNORMAL LOW (ref 6–23)
Calcium, Ion: 1.11 mmol/L — ABNORMAL LOW (ref 1.12–1.32)
Chloride: 108 mEq/L (ref 96–112)
Creatinine, Ser: 0.4 mg/dL (ref 0.4–1.2)
Glucose, Bld: 139 mg/dL — ABNORMAL HIGH (ref 70–99)
HCT: 46 % (ref 36.0–46.0)
Hemoglobin: 15.6 g/dL — ABNORMAL HIGH (ref 12.0–15.0)
Potassium: 3.7 mEq/L (ref 3.5–5.1)
Sodium: 140 mEq/L (ref 135–145)
TCO2: 21 mmol/L (ref 0–100)

## 2011-01-13 LAB — MAGNESIUM: Magnesium: 2.2 mg/dL (ref 1.5–2.5)

## 2011-01-13 LAB — POCT CARDIAC MARKERS
CKMB, poc: 2.6 ng/mL (ref 1.0–8.0)
Myoglobin, poc: 106 ng/mL (ref 12–200)
Troponin i, poc: <0.05 ng/mL (ref 0.00–0.09)

## 2011-01-13 LAB — DIFFERENTIAL
Basophils Absolute: 0.1 10*3/uL (ref 0.0–0.1)
Basophils Relative: 1 % (ref 0–1)
Eosinophils Absolute: 0.1 10*3/uL (ref 0.0–0.7)
Eosinophils Relative: 1 % (ref 0–5)
Lymphocytes Relative: 47 % — ABNORMAL HIGH (ref 12–46)
Lymphs Abs: 4.6 10*3/uL — ABNORMAL HIGH (ref 0.7–4.0)
Monocytes Absolute: 0.3 10*3/uL (ref 0.1–1.0)
Monocytes Relative: 3 % (ref 3–12)
Neutro Abs: 4.8 10*3/uL (ref 1.7–7.7)
Neutrophils Relative %: 48 % (ref 43–77)

## 2011-01-13 LAB — CK TOTAL AND CKMB (NOT AT ARMC)
Relative Index: 1.1 (ref 0.0–2.5)
Total CK: 190 U/L — ABNORMAL HIGH (ref 7–177)

## 2011-01-13 LAB — LIPASE, BLOOD: Lipase: 18 U/L (ref 11–59)

## 2011-01-13 LAB — BASIC METABOLIC PANEL: GFR calc Af Amer: >60 mL/min (ref >60)

## 2011-01-13 LAB — LIPID PANEL
HDL: 39 mg/dL — ABNORMAL LOW (ref >39)
Total CHOL/HDL Ratio: 5.3 RATIO

## 2011-01-18 ENCOUNTER — Other Ambulatory Visit: Payer: Self-pay | Admitting: Family Medicine

## 2011-01-18 DIAGNOSIS — Z1231 Encounter for screening mammogram for malignant neoplasm of breast: Secondary | ICD-10-CM

## 2011-01-23 LAB — BASIC METABOLIC PANEL
Calcium: 10.2 mg/dL (ref 8.4–10.5)
Creatinine, Ser: 0.65 mg/dL (ref 0.4–1.2)
GFR calc Af Amer: >60 mL/min (ref >60)
GFR calc non Af Amer: >60 mL/min (ref >60)
Glucose, Bld: 102 mg/dL — ABNORMAL HIGH (ref 70–99)
Sodium: 140 mEq/L (ref 135–145)

## 2011-01-23 LAB — CBC
Hemoglobin: 13.8 g/dL (ref 12.0–15.0)
RBC: 4.42 MIL/uL (ref 3.87–5.11)
RDW: 14.2 % (ref 11.5–15.5)

## 2011-01-23 LAB — POCT CARDIAC MARKERS
CKMB, poc: 1.3 ng/mL (ref 1.0–8.0)
Myoglobin, poc: 85.7 ng/mL (ref 12–200)
Troponin i, poc: <0.05 ng/mL (ref 0.00–0.09)

## 2011-01-23 LAB — DIFFERENTIAL
Basophils Absolute: 0 10*3/uL (ref 0.0–0.1)
Lymphocytes Relative: 53 % — ABNORMAL HIGH (ref 12–46)
Monocytes Absolute: 0.7 10*3/uL (ref 0.1–1.0)
Neutro Abs: 2.4 10*3/uL (ref 1.7–7.7)
Neutrophils Relative %: 35 % — ABNORMAL LOW (ref 43–77)

## 2011-01-26 ENCOUNTER — Ambulatory Visit (INDEPENDENT_AMBULATORY_CARE_PROVIDER_SITE_OTHER): Payer: MEDICARE | Admitting: Internal Medicine

## 2011-01-26 ENCOUNTER — Encounter: Payer: Self-pay | Admitting: Internal Medicine

## 2011-01-26 VITALS — BP 132/84 | HR 65 | Temp 97.1°F | Ht 65.0 in | Wt 174.5 lb

## 2011-01-26 DIAGNOSIS — J309 Allergic rhinitis, unspecified: Secondary | ICD-10-CM

## 2011-01-26 DIAGNOSIS — Z0000 Encounter for general adult medical examination without abnormal findings: Secondary | ICD-10-CM

## 2011-01-26 DIAGNOSIS — Z720 Tobacco use: Secondary | ICD-10-CM

## 2011-01-26 DIAGNOSIS — I10 Essential (primary) hypertension: Secondary | ICD-10-CM

## 2011-01-26 DIAGNOSIS — F17200 Nicotine dependence, unspecified, uncomplicated: Secondary | ICD-10-CM

## 2011-01-26 DIAGNOSIS — G809 Cerebral palsy, unspecified: Secondary | ICD-10-CM

## 2011-01-26 DIAGNOSIS — J302 Other seasonal allergic rhinitis: Secondary | ICD-10-CM

## 2011-01-26 MED ORDER — FLUTICASONE PROPIONATE 50 MCG/ACT NA SUSP: 1.0000 | Freq: Every day | NASAL | Status: DC

## 2011-01-26 MED ORDER — CETIRIZINE HCL 10 MG PO CAPS: 10.0000 mg | ORAL_CAPSULE | Freq: Every day | ORAL | Status: DC

## 2011-01-26 NOTE — Patient Instructions (Signed)
   Please follow-up at the clinic in 2 months, at which time we will reevaluate your headaches, blood pressure, cholesterol - we will also check labs at that visit, and plan to do tetanus shot.  If you have been started on new medication(s), and you develop throat closing, tongue swelling, rash, please stop the medication and call the clinic at [phone] and go to the ER.  If you are diabetic, please bring your meter to your next visit.  If symptoms worsen, or new symptoms arise, please call the clinic or go to the ER.  Please bring all of your medications in a bag to your next visit.

## 2011-01-26 NOTE — Progress Notes (Signed)
Subjective:    Patient ID: [name], female    DOB: January 03, 1966, 45 y.o.   MRN: [id number]  HPI Pt is a 45yo female with PMHX of Cerebral palsy, HLD, HTN, CAD who presents to clinic today for the  following:  1) Cerebral palsy - patient has had cerebral palsy since birth, and has had lower extremity weakness, which has been getting progressively worse over the past several years, and she feels worsening of her muscle strength, causing balance issue. Cerebral palsy has mostly affected her legs, and she does not have significant upper extremity strength, therefore, making it difficult to use a cane or walker. Pt has been trying to get a motorized wheelchair, however, states that per her insurance, her she must pay for 1/5 of cost of wheelchair - priced out, the motorized wheelchair is $5000, and the pt is unable to afford the $1000 deductible. She asks at this time for a prescription for standard wheelchair. No recent falls, trauma.   2) Headache - Pt indicates pressure-like frontal headache and behind her eyes, which has been intermittent x 2 days. No specific aggravating or alleviating factors, not improved with quiet dark room. No recent change in diet, exercise, or stress levels. Confirms nasal congestion and itchy watery eyes, but no associated ear pain, ear discharge, sore throat, vision changes, dizziness, lightheadedness, nausea, vomiting. No recent sick contacts. Has not checked her blood pressure during acute episodes.   3) HTN - Patient does not check blood pressure regularly at home. Currently taking Toprol XL 25mg  daily. Denies dizziness, lightheadedness, chest pain, shortness of breath.    Review of Systems Per HPI.  Current Outpatient Medications Medication Sig  . aspirin 325 MG EC tablet Take 325 mg by mouth daily.    . baclofen (LIORESAL) 10 MG tablet Take 1 tablet (10 mg total) by mouth every 8 (eight) hours as needed. For spasticity  . citalopram (CELEXA) 20 MG tablet  Take 20 mg by mouth daily.    . clopidogrel (PLAVIX) 75 MG tablet Take 75 mg by mouth daily.    . ergocalciferol (DRISDOL) 50000 UNITS capsule Take 50,000 Units by mouth once a week.    . famotidine (PEPCID) 40 MG tablet Take 1 tablet (40 mg total) by mouth 2 (two) times daily.  Marland Kitchen gabapentin (NEURONTIN) 300 MG capsule Take 300 mg by mouth 2 (two) times daily.    . metoprolol succinate (TOPROL-XL) 25 MG 24 hr tablet Take 1 tablet (25 mg total) by mouth daily.  . nitroGLYCERIN (NITROSTAT) 0.4 MG SL tablet Place 0.4 mg under the tongue every 5 (five) minutes x 3 doses as needed.    . pravastatin (PRAVACHOL) 40 MG tablet Take 1 tablet (40 mg total) by mouth daily.  . traMADol (ULTRAM) 50 MG tablet Take 1 tablet (50 mg total) by mouth every 8 (eight) hours as needed. For pain  . traZODone (DESYREL) 100 MG tablet Take 100 mg by mouth at bedtime.    Marland Kitchen zolpidem (AMBIEN) 10 MG tablet Take 10 mg by mouth at bedtime as needed.      Allergies Penicillins  Past Medical History  Diagnosis Date  . Coronary artery disease 02/2008    Followed by Dr. Milas Kocher. H/O inferior STEMI (02/2008) - s/p PICA and stenting of mid-circumflex with BMS // Heart cath showed  acute diaphragmatic wall infarction with total occlusion of mid circumflex artery, no major obstruction in LAD or small nondominant right coronary. Inferior basal wall hypokinesis.  // Eugenie Birks stress  test (09/2009) -  low risk , and no evidence of ischemia.  . Bipolar disorder      Followed by mental health, who prescribe her bipolar meds  . Tobacco abuse   . Cerebral palsy      with history of neuropathic pain  and spasticity,  requiring long-term pain medications  . Hypertension   . GERD (gastroesophageal reflux disease)   . Vitamin D deficiency 11/2009     patient treated with high-dose vitamin D weekly x3 months,  levels were rechecked and it was determined that the patient continued to require high dose repletion of vitamin D.  . History of  pelvic mass     Found on CT abd/ pelvis (04/2008) - 13.3 cm cystic pelvis mass, possibly left ovarian origin. To be followed at [facility].     Past Surgical History  Procedure Date  . Total abdominal hysterectomy 03/2003     secondary to symptomatic fibroids.  . Cesarean section     x2  . Tubal ligation   .  elective abortion     x2  . Other surgical history      heel surgery, bilateral       Objective:   Physical Exam General: Vital signs reviewed and noted. Well-developed, well-nourished, in no acute distress; alert, appropriate and cooperative throughout examination.  Head: Normocephalic, atraumatic.  Ears: TM nonerythematous, not bulging, good light reflex bilaterally.  Nose: Mucous membranes moist, inflammed, clear discharge, nonerythematous.  Throat: Oropharynx nonerythematous, no exudate appreciated.   Neck: No deformities, masses, or tenderness noted.  Lungs:  Normal respiratory effort. Clear to auscultation BL without crackles or wheezes.  Heart: RRR. S1 and S2 normal without gallop, murmur, or rubs.  Abdomen:  BS normoactive. Soft, Nondistended, non-tender.  No masses or organomegaly.  Extremities: No pretibial edema.      Assessment & Plan:  Case and plan of care discussed with Dr. Anderson Malta.

## 2011-01-29 ENCOUNTER — Encounter: Payer: Self-pay | Admitting: Internal Medicine

## 2011-02-09 DIAGNOSIS — J302 Other seasonal allergic rhinitis: Secondary | ICD-10-CM | POA: Insufficient documentation

## 2011-02-09 DIAGNOSIS — Z720 Tobacco use: Secondary | ICD-10-CM | POA: Insufficient documentation

## 2011-02-09 NOTE — Assessment & Plan Note (Signed)
Pt's presenting symptoms and physical exam findings highly suggestive of seasonal allergies. Headache description is not characteristic of migraine, tension, or cluster headaches.  - Start Zyrtec, Flonase, OTC med for headaches.

## 2011-02-09 NOTE — Assessment & Plan Note (Signed)
Per patient's description, has significant difficulty with ambulation and has applied for power wheelchair, which will be be too expensive.  - RX given for manual wheelchair, for [facility] to arrange.

## 2011-02-09 NOTE — Assessment & Plan Note (Signed)
-   Scheduled for mammogram - Refuses most preventative care matters today, including PAP, blood work (CMP, FLP), and tetanus. - Asked to come fasting to next visit so we are able to obtain blood work, and complete tetanus shot, and possibly PAP.

## 2011-02-09 NOTE — Assessment & Plan Note (Signed)
Well-controlled.  Continue current regimen. 

## 2011-02-09 NOTE — Assessment & Plan Note (Signed)
Encouraged smoking cessation. Pt not ready at this time, will consider and get back with me when ready to quit.

## 2011-02-14 ENCOUNTER — Ambulatory Visit (HOSPITAL_COMMUNITY)
Admission: RE | Admit: 2011-02-14 | Discharge: 2011-02-14 | Disposition: A | Discharge status: Home / Self Care | Payer: MEDICARE | Source: Ambulatory Visit | Attending: Family Medicine | Admitting: Family Medicine

## 2011-02-14 DIAGNOSIS — Z1231 Encounter for screening mammogram for malignant neoplasm of breast: Secondary | ICD-10-CM | POA: Insufficient documentation

## 2011-02-16 ENCOUNTER — Encounter: Payer: Self-pay | Admitting: Internal Medicine

## 2011-02-21 NOTE — H&P (Signed)
NAMECECILLE, [name]             ACCOUNT NO.:  [id number]   MEDICAL RECORD NO.:  [id number]          PATIENT TYPE:  INP   LOCATION:  1425                         FACILITY[facility]   PHYSICIAN:  Eduard Clos, MDDATE OF BIRTH:  [birth year]   DATE OF ADMISSION:  04/01/2008  DATE OF DISCHARGE:                              HISTORY & PHYSICAL   CHIEF COMPLAINT:  Nausea, vomiting and abdominal pain.   HISTORY OF PRESENT ILLNESS:  A 45 year old female who had recent ST  elevation MI, inferior wall, status post stenting with bare-metal,  hypertension, ongoing tobacco abuse, history of cerebral palsy and  bipolar disorder presented to the ER complaining of increasing abdominal  pain, nausea and vomiting.  The patient stated the symptoms started 4  days ago when she started developing some periumbilical pain, which  became more diffuse and colicky, and anything she took orally she  started throwing up.  She had thrown up 15-16 times over the last 3-4  days.  She also has been having loose stools, has also had a loose bowel  movement today.  Denies any blood in the vomitus or in the stools.  The  patient was not able to take her medications for that 4 days due to  constant vomiting in an attempt to take anything in orally.  The patient  denies any chest pain, shortness of breath, weakness of limbs,  dizziness, or any fever or chills.  Denies any dysuria.   PAST MEDICAL HISTORY:  1. Recent stenting for inferior wall MI.  2. Hypertension.  3. Hyperlipidemia.  4. Ongoing tobacco abuse.  5. Bipolar disorder.  6. Cerebral palsy.   PAST SURGICAL HISTORY:  1. Hysterectomy.  2. Cesarean section.  3. Bilateral heel surgery.  4. Stenting for inferior wall MI last month with bare-metal stent.   MEDICATIONS PRIOR TO ADMISSION:  1. Toprol XL 25 mg p.o. daily.  2. Lithium carbonate 600 mg p.o. daily.  3. Lipitor 80 mg at bedtime.  4. Plavix 75 mg daily.  5. Citalopram 20 mg daily.  6.  Ambien 5 mg p.o. at bedtime.   ALLERGIES:  PENICILLIN.   FAMILY HISTORY:  The patient's father had MI at age 65.   SOCIAL HISTORY:  The patient smokes cigarettes and has been advised to  quit smoking.  Denies any alcohol or drug abuse.   REVIEW OF SYSTEMS:  As in history of present illness, nothing else  significant.   PHYSICAL EXAMINATION:  GENERAL:  Patient examined at bedside, not in  acute distress.  VITAL SIGNS:  Blood pressure 157/90, pulse 69 per minute, temperature  97.5, respirations 18 per minute.  O2 sat 95%.  HEENT:  Anicteric.  No pallor.  CHEST:  Bilateral air entry present.  No rhonchi, no crepitation.  HEART:  S1-S2 heard.  ABDOMEN:  Soft.  Distention with tinkling sounds all over.  No guarding,  no rigidity, no discoloration.  A previous scar is seen at the  infraumbilical region.  CNS:  The patient is alert, awake and oriented to time, place and  person.  MOTOR:  Upper and lower extremities  5/5.  EXTREMITIES:  Peripheral pulses felt.  No edema.   LABORATORY DATA:  Acute abdominal series shows no acute cardiopulmonary  findings, small bowel obstruction, possible ascites.  CBC - WBC is 10.1,  hemoglobin 17.3, hematocrit 51, platelets 272.  Complete metabolic  panel; sodium 136, potassium 3.7, chloride 105, glucose 99, BUN 10,  creatinine 0.8, total bilirubin 1, alkaline phosphatase 65, AST 18, ALT  15, total protein 7.6, albumin 4.3, lipase 13.  Pregnancy negative.  Lithium level less than 0.25.  Urine negative for blood, negative for  leukocytes, nitrites.  WBC 0-2, bacteria rare, a small amount of ketones  and bilirubin 1400.   ASSESSMENT:  1. Small bowel obstruction.  2. Dehydration.  3. Recent myocardial infarction, inferior wall, status post bare-metal      stenting.  4. Hypertension.  5. Hyperlipidemia.  6. Bipolar disorder.  7. History of cerebral palsy.   PLAN:  Admit patient to telemetry.  Will place the patient on IV fluids,  n.p.o.  Will be  started on NG tube with low intermittent suction.  Get a  CAT scan of the abdomen and pelvis with contrast.  Will place the  patient on aspirin and Plavix per rectal, metoprolol IV p.r.n.  Once CAT  scan of the abdomen and pelvis is done, further recommendations  according to the results.  I have advised the patient to quit smoking.      Eduard Clos, MD  Electronically Signed     ANK/MEDQ  D:  04/02/2008  T:  04/02/2008  Job:  [id number]

## 2011-02-21 NOTE — Discharge Summary (Signed)
[name], [name]             ACCOUNT NO.:  [id number]   MEDICAL RECORD NO.:  [id number]          PATIENT TYPE:  INP   LOCATION:  2020                         FACILITY:  [facility]   PHYSICIAN:  Everardo Beals. Juanda Chance, MD, FACCDATE OF BIRTH:  01-01-1966   DATE OF ADMISSION:  02/17/2008  DATE OF DISCHARGE:                         DISCHARGE SUMMARY - REFERRING   ADDENDUM:  Discharge time 35 minutes.      Joellyn Rued, PA-C      Bruce R. Juanda Chance, MD, [facility]  Electronically Signed    EW/MEDQ  D:  02/19/2008  T:  02/19/2008  Job:  [id number]

## 2011-02-21 NOTE — Discharge Summary (Signed)
NAMEAAMNA, MALLOZZI             ACCOUNT NO.:  [id number]   MEDICAL RECORD NO.:  [id number]          PATIENT TYPE:  INP   LOCATION:  2020                         FACILITY:  [facility]   PHYSICIAN:  Bevelyn Buckles. Bensimhon, MDDATE OF BIRTH:  May 28, 1966   DATE OF ADMISSION:  02/17/2008  DATE OF DISCHARGE:  02/19/2008                         DISCHARGE SUMMARY - REFERRING   DISCHARGE DIAGNOSES:  1. Inferior ST elevated myocardial infarction.  2. Coronary artery disease.  3. Status post bare metal stenting to the circumflex.  4. Tobacco use.  5. Hypokalemia.  6. History of bipolar disorder.  7. Hypertension.  8. Hyperlipidemia.  9. History as previously.   SUMMARY OF HISTORY:  Ms. Boulos is a 45 year old, African-American  female who has had prior episodes of chest discomfort.  However, on the  day of admission, her symptoms reached a 10 on a scale of 0 to 10  associated with shortness of breath and nausea.  Because it did not  resolve, she presented to the Emergency Room, and her EKGs were  consistent with myocardial infarction.   PAST MEDICAL HISTORY:  Notable for untreated hypertension, history of  cerebral palsy, depression, bipolar disorder, tobacco use.   LABORATORY DATA:  Admission hemoglobin and hematocrit was 14.8 and 44.7,  normal indices, platelets 282,000, WBCs 11.3.  At the time of discharge,  hemoglobin and hematocrit was 13.3 and 38.8, normal indices, platelets  237,000, WBCs 7.9.  PTT greater than 200.  PT 48.5 with an INR 5.  At  the time of discharge, sodium was 140, potassium 36, BUN 2, creatinine  0.66, glucose 97.  Hemoglobin A1c was 5.8.  Initial CK-MB was 438 and  58.4 with a relative index 13.3 and a troponin I of 3.54.  Second CK-MB  was 1774 and 270.4, relative index of 15.4.  Cholesterol showed a total  cholesterol 157, triglycerides 41, HDL 29, LDL 120, TSH 0.501, lithium  less than 0.25.  Chest x-ray on the 11th showed scoliosis, no active  disease.   HOSPITAL COURSE:  The patient was admitted to [facility] and  taken directly to the catheterization laboratory.  Dr. Juanda Chance performed  bare metal stenting to the mid circumflex.  EF was essentially normal  with some basilar inferior hypokinesis.  She has residual 80% OM  disease.  Telemetry revealed some wide complex tachycardia that was felt  to be nonsustained ventricular tachycardia.  Cardiac rehab assisted with  ambulation and education as well as progression nursing.  She received  counseling by staff in regards to tobacco cessation.  By Feb 19, 2008,  she was ambulating the hall without difficulty.  Dr. Gala Romney felt that  she could be discharged home.   PROCEDURES PERFORMED:  Cardiac catheterization with bare metal stenting  to the circumflex by Dr. Juanda Chance on Feb 17, 2008.   DISPOSITION:  The patient is discharged home.  Asked to maintain low-  sodium, heart-healthy diet.  Wound care and activities are per  supplemental sheet.   NEW PRESCRIPTIONS:  1. Aspirin 325 mg daily.  2. Plavix 75 daily.  3. Nitroglycerin 0.4 as needed.  4. Toprol  XL 25 mg daily.  5. Lipitor 80 mg at bedtime.  6. She was asked to continue her Celexa, lithium, and Ambien as      previously.   She will need blood work in 6-8 weeks in regards to FLP and LFTs.  Obtain a primary care physician.  Counseled on no smoking or tobacco  products.  She was asked to bring all medications to all appointments,  and she will follow up with Dr. Gala Romney in the [facility] office on  March 12, 2008, at 2 p.m.      Joellyn Rued, PA-C      Bevelyn Buckles. Bensimhon, MD  Electronically Signed    EW/MEDQ  D:  02/19/2008  T:  02/19/2008  Job:  [id number]

## 2011-02-21 NOTE — Assessment & Plan Note (Signed)
[facility]                            CARDIOLOGY OFFICE NOTE   NAME:Graver, TARIN JOHNDROW                    MRN:          [id number]  DATE:03/12/2008                            DOB:          Oct 17, 1965    PRIMARY CARE PHYSICIAN:  None.   INTERVAL HISTORY:  Ms. Holian is a very pleasant 45 year old woman  with a history of hypertension, hyperlipidemia and bipolar disease,  cerebral palsy and ongoing tobacco use.  She was admitted in mid-May  with an inferior ST elevation myocardial infarction and was taken to the  catheterization lab by Dr. Charlies Constable, underwent PTCA and bare metal  stenting for her circumflex.  She had some residual disease in the OM,  but this was treated medically.  Ejection fraction was essentially  normal with some basilar inferior hypokinesis.   She returns today for routine follow-up.  She is doing well.  She gets  occasional chest twinges but no angina, no shortness of breath.  She has  not had any heart failure.  She has been compliant with all her  medications.  There is no problems with her groin site.  She has  continued to smoke a half pack of cigarettes a day.   CURRENT MEDICATIONS:  1. Aspirin 325.  2. Plavix 75.  3. Toprol XL 25.  4. Lipitor 80.  5. Citalopram 20 a day.  6. Lithium 600 b.i.d.   PHYSICAL EXAM:  She is well-appearing, no acute distress, ambulates  around the clinic without respiratory difficulty.  Blood pressure  120/80, heart rate is 58, weight is 184.  HEENT:  Normal.  NECK:  Supple, No JVD, carotids are 2+ bilateral without any bruits.  There is  no lymphadenopathy or thyromegaly.  CARDIAC:  PMI is nondisplaced, she  has distant heart sounds, regular rate and rhythm; no murmurs, rubs or  gallops.  LUNGS:  Clear.  ABDOMEN:  Obese, nontender, nondistended, no  hepatosplenomegaly, no bruits, no masses, good bowel sounds.  EXTREMITIES:  Warm with no cyanosis, clubbing or edema; groin site  looks  good, no rash.  NEURO:  Alert and oriented x3, cranial nerves II-XII are  intact, moves all four extremities without difficulty, affect is  pleasant.   ASSESSMENT/PLAN:  1. Coronary artery disease, status post recent inferior myocardial      infarction.  She is doing well, continue current therapy.  We will      have our research nurses see her about TRA2P trial.  2. Hypertension, well-controlled.  3. Hyperlipidemia, continue Lipitor.  We will check lipids again in 3      months.  4. Tobacco use, I counseled her on the need to stop smoking.  We will      try a Chantix starter kit.   DISPOSITION:  She will return to clinic in 6 months.  We will arrange to  have her set up with a primary care physician here at [facility].     Bevelyn Buckles. Bensimhon, MD  Electronically Signed    DRB/MedQ  DD: 03/12/2008  DT: 03/12/2008  Job #: [id number]

## 2011-02-21 NOTE — Consult Note (Signed)
[name], [name]             ACCOUNT NO.:  [id number]   MEDICAL RECORD NO.:  [id number]          PATIENT TYPE:  INP   LOCATION:  1425                         FACILITY:  [facility]   PHYSICIAN:  Madolyn Frieze. Jens Som, MD, FACCDATE OF BIRTH:  Jun 25, 1966   DATE OF CONSULTATION:  04/02/2008  DATE OF DISCHARGE:                                 CONSULTATION   REFERRING PHYSICIAN:  [facility].   [name] is a 45 -year-old female with past medical history of  cerebral palsy, bipolar disorder, coronary disease, hypertension,  hyperlipidemia whom I am asked to evaluate preoperatively prior to  abdominal surgery for an ovarian mass/small-bowel obstruction.  Her  cardiac history dates back to Feb 17, 2008 when she developed chest  pain.  She was admitted to [facility] and underwent urgent  catheterization secondary to ST- elevation.  She did rule in for  myocardial infarction with a peak CK of 1543 and an MB of 245.  Her  catheterization revealed inferobasal akinesis and overall preserved LV  function.  She had PCI of her left circumflex with a bare metal stent at  that time.  There is no other significant disease noted.  Note, I do not  have a full catheterization report available.  She has not had recurrent  chest pain or dyspnea.  She was admitted to this hospital on April 01, 2008 with complaints of nausea, vomiting, diarrhea and abdominal pain.  A CAT scan revealed a 13.3-cm abdominal mass most likely originating  from the left ovary.  There is also evidence of a small-bowel  obstruction.  She is scheduled to be transferred [facility] for surgery and  Cardiology is asked to evaluate preoperatively.   PAST MEDICAL HISTORY:  Significant for:  1. Hypertension.  2. Hyperlipidemia.  3. There is no diabetes mellitus.  4. She has a history of coronary artery disease as outlined in the      HPI.  5. She also has a history of cerebral palsy and bipolar disorder.  6. She has had a prior  hysterectomy as well as a cesarean section.  7. She had bilateral foot surgery.   SOCIAL HISTORY:  She does smoke.  She occasionally consumes alcohol.   FAMILY HISTORY:  Positive for coronary disease as her father had a  myocardial infarction in his 47s.   She has an allergy to PENICILLIN.   PRESENT MEDICATIONS:  1. Aspirin 300 mg by rectum daily.  2. Plavix has been discontinued.  3. She is also on IV ciprofloxacin 400 mg IV q. 12.  4. She is on Lovenox 40 mg subcu daily.  5. She is also on Flagyl 500 mg IV q. 8 hours.  6. Protonix 40 mg IV q.h.s.  7. Metoprolol 5 mg IV q.4 hours p.r.n.  8. She is also on pain medications.   REVIEW OF SYSTEMS:  She denies any headaches or fevers or chills.  There  is no productive cough or hemoptysis.  There is no dysphagia,  odynophagia, melena or hematochezia.  She has had nausea, vomiting and  diarrhea secondary to her SBO.  There has also been abdominal pain.  There is no dysuria or hematuria.  There is no rash or seizure activity.  There is orthopnea, PND or pedal edema.  The remainder of systems are  negative.   PHYSICAL EXAMINATION:  Today shows a blood pressure of 120/77 and her  pulse is 63.  She is afebrile with a temperature of 98.  She is 98% on  room air.  She is well-developed and well-nourished, in no distress.  SKIN:  Warm and dry.  She does not appear depressed.  There is no  peripheral clubbing.  BACK:  Normal.  HEENT:  Normal with normal eyelids.  NECK:  Supple with normal upstrokes bilaterally, no lymphadenopathy or  bruits.  There is no jugular distention and I cannot feel thyromegaly.  CHEST:  Clear to auscultation on expansion.  CARDIOVASCULAR:  Regular rate and rhythm.  Normal S1 and S2.  There are  no murmurs, rubs or gallops noted.  ABDOMINAL EXAM:  Shows diffuse tenderness to palpation.  However, there  is no rebound.  Her bowel sounds are hypoactive.  I cannot palpate  masses.  There is no abdominal bruit noted.   There is distention.  She  has 2+ femoral pulses bilaterally.  No bruits.  EXTREMITIES:  Show no edema and I palpate no cords.  She has 2+  posterior tibial pulses bilaterally.  NEUROLOGICAL EXAM:  Consistent with a prior diagnosis of cerebral palsy.  Note there has been some atrophy of her lower extremities.  However, she  is alert and oriented x3.  Her cranial nerves II-XII are grossly intact.   LABORATORIES:  Show a sodium of 137, potassium 3.2.  BUN and creatinine  are 7 and 0.60 respectively.  Her glucose is 114.  She did have a  lithium level drawn on June 24 that was less than 0.25.  Her liver  functions are normal.  Her lipase is also normal.  Her hemoglobin and  hematocrit from June 24 were 16 and 48 respectfully.  Her white blood  cell count was 10.1 with a platelet count of 272.  Her pregnancy test  was negative.  The CT scan as described above with the cystic mass in  her abdomen as well as the SBO.  Her chest x-ray from Feb 17, 2008  showed scoliosis and no active disease.  Her electrocardiogram is  pending at time of this dictation.   DIAGNOSES:  1. Preoperative evaluation prior to resection of ovarian mass and      small bowel obstruction:  [name] had her bare metal stent      placed on Feb 17, 2008 in the setting of an acute myocardial      infarction.  She has not had chest pain or increased dyspnea since      that time.  Given that she had a bare metal stent, her Plavix can      be safely discontinued.  However, we would recommend continuing her      aspirin.  We would also recommend continuing her beta-blockade.  If      she cannot take p.o., then we can begin Lopressor 5 mg IV q. 6 and      transition back to p.o. postoperatively.  We recommend resuming her      statin postoperatively.  Given that she has had no symptoms, she      will not require further evaluation prior to surgery.  2. Coronary disease:  As outlined in #  1, she will continue on aspirin,       beta-blocker and statin.  3. Hypertension:  We will plan to treat with IV Lopressor until she      can take p.o..  4. Hyperlipidemia:  We will resume her statin postoperatively.  5. Ovarian cyst/small-bowel obstruction:  This will be managed per the      surgeons at [facility].  6. Bipolar disorder.  7. History of cerebral palsy.      Madolyn Frieze Jens Som, MD, [facility]  Electronically Signed     BSC/MEDQ  D:  04/02/2008  T:  04/02/2008  Job:  [id number]

## 2011-02-21 NOTE — H&P (Signed)
NAMEILEANNA, GEMMILL             ACCOUNT NO.:  [id number]   MEDICAL RECORD NO.:  [id number]          PATIENT TYPE:  INP   LOCATION:  0104                         FACILITY:  [facility]   PHYSICIAN:  Eduard Clos, MDDATE OF BIRTH:  1965-12-27   DATE OF ADMISSION:  04/01/2008  DATE OF DISCHARGE:                              HISTORY & PHYSICAL   ADDENDUM:  I did discuss with the on-call cardiologist from [facility]  Cardiology about the patient's nausea, vomiting and small bowel  obstruction, and the need for Plavix as the patient is four weeks out  from her bare metal stent placement.  The cardiologist advised that it  is okay for her to be off Plavix, but continue aspirin, at least,  correctly for now.      Eduard Clos, MD  Electronically Signed     ANK/MEDQ  D:  04/02/2008  T:  04/02/2008  Job:  [id number]

## 2011-02-21 NOTE — Discharge Summary (Signed)
[name], [name]             ACCOUNT NO.:  [id number]   MEDICAL RECORD NO.:  [id number]          PATIENT TYPE:  INP   LOCATION:  1425                         FACILITY:  [facility]   PHYSICIAN:  Mobolaji B. Bakare, M.D.DATE OF BIRTH:  October 24, 1965   DATE OF ADMISSION:  04/02/2008  DATE OF DISCHARGE:  04/02/2008                               DISCHARGE SUMMARY   PRIMARY CARE PHYSICIAN:  Unassigned.   CARDIOLOGIST:  Dr. Gala Romney with [facility].   FINAL DIAGNOSIS:  1. Small bowel obstruction, likely secondary to a 13.3 cm pelvic mass.  2. A 13.3 cm pelvic mass, likely arising from left ovary.  3. Nausea, vomiting and diarrhea, resolved.  4. Dehydration.  5. Coronary artery disease, status post stent placement on Feb 17, 2008.  6. Hypokalemia.   SECONDARY DIAGNOSES:  1. Bipolar disorder.  2. Cerebral palsy.  3. Hypertension   PROCEDURES:  1. CT abdomen and pelvis done on April 02, 2008, showed distal small      bowel obstruction secondary to cystic pelvic mass, small amount of      perihepatic ascites, a 13.3 cm cystic pelvic mass, possibly left      ovarian origin, could not exclude neoplasm.  This appears to be the      etiology of small-bowel obstruction, small amount of pelvic      ascites.  2. Abdominal x-ray done on April 01, 2008, showed no acute      cardiopulmonary findings.  Small-bowel obstruction, possible      ascites.   HOSPITAL COURSE:  [name] is a 44 year old African American  female with history of cerebral palsy and bipolar disorder.  She was  admitted earlier this morning with abdominal distention, nausea,  vomiting and diarrhea.  Please see the H&P for full details.  The nausea  and vomiting have resolved since admission, last episode was yesterday.  The patient has not moved her bowels since yesterday as well.  Abdominal  x-ray was suggestive of small-bowel obstruction.  This was followed by a  CT scan of the abdomen and pelvis, results  as noted above.  It noted a  pelvic mass of 13.3 cm, likely arising from the left ovary and likely  causing distal obstruction of the small bowel.   The patient has been kept n.p.o.  She is currently on IV fluid normal  saline and she is on p.r.n. Phenergan, Zofran and Dilaudid for  symptomatic relief.  Given the pelvic origin of the mass, she will need  a gyne/oncologist evaluation.  I have spoken to Dr. Duard Brady at [facility], who will be accepting [name] onto her service.  The  patient is currently being rehydrated with IV fluids.  She is noted to  have hypokalemia.  This is being repleted in IV fluids and she is also  completing 3-year rounds on the of potassium chloride 10 mEq per run.   1. The patient has been evaluated by cardiology for preop evaluation.      Recommendation is to discontinue Plavix, but to continue aspirin.  Aspirin is being given per rectum.  2. Continue beta-blocker, and the patient can be started on Lopressor      5 mg IV q.6 h., and then transitioned to p.o. postoperatively.  3. Resume starting postoperatively.  4. The patient can proceed to surgery.  Please see full consult note.   Several attempts have been made to passing an NG tube.  The patient does  not tolerate it, and she has consistently refused to have NG tube  placed, except under sedation.  I have explained to her the risk of  aspiration and death.  The patient is willing to take these risks.  I  will defer NG tube placement to perioperative period by receiving  physician at [facility].  The patient was empirically started on  antibiotics in view of the nausea, vomiting and diarrhea.  She does not  have any leukocytosis and no fever.  Antibiotics have been discontinued.   DISCHARGE MEDICATIONS:  1. Dilaudid 5 mg IV q.4 h., p.r.n. for pain.  2. Lopressor 5 mg IV q.6 h., hold for systolic blood pressure less      than 100 and diastolic less than 55.  3. Aspirin 300 mg rectal (PR)  daily.  4. Lovenox 40 mg subcutaneous daily.  5. IV fluid D5 normal saline at 125 mL/hour.  6. Protonix 40 mg IV daily.  7. Zofran 4 mg IV q.8 h., p.r.n.  8. Phenergan 12.5 mg IV q.4 h., p.r.n. nausea.   DISCHARGE LABORATORY DATA:  Sodium 137, potassium 3.2 (this is being  repleted).  Chloride 104, CO2 of 23, glucose 114, BUN 7, creatinine  0.60, calcium 8.9.  Lithium level less than 0.25.  Lipase 13, total  bilirubin 1.0, direct 0.2 and direct 0.8.  Alkaline phosphatase 65, AST  18, ALT 15, total protein 7.8, albumin 4.3.  Urine microscopy shows many  squamous cells.  Urinalysis showed specific gravity of 1.036, negative  for nitrites and leukocyte esterase.  White cells 10.1, hemoglobin 16.0,  hematocrit 48.1, platelets 272.  Urine pregnancy test was negative.      Mobolaji B. Corky Downs, M.D.  Electronically Signed     MBB/MEDQ  D:  04/02/2008  T:  04/02/2008  Job:  [id number]   cc:   Bevelyn Buckles. Bensimhon, MD  [address] [place] [address]

## 2011-02-21 NOTE — H&P (Signed)
[name], [name]             ACCOUNT NO.:  [id number]   MEDICAL RECORD NO.:  [id number]          PATIENT TYPE:  INP   LOCATION:  2914                         FACILITY:  [facility]   PHYSICIAN:  Bevelyn Buckles. Bensimhon, MDDATE OF BIRTH:  [birth year]   DATE OF ADMISSION:  02/17/2008  DATE OF DISCHARGE:                              HISTORY & PHYSICAL   CHIEF COMPLAINT:  Chest pain.   HISTORY OF PRESENT ILLNESS:  [name] is a 45 year old female with  no previous history of coronary artery disease.  She has had some recent  episodes of chest pain, but they were not that severe and she did not  seek medical help.  Today, she had sudden onset of substernal chest pain  at approximately 10:30 this morning.  Symptoms reached a 10/10.  There  were associated with shortness of breath and nausea.  When her symptoms  did not resolve.  She came to the [facility] Emergency Room.  There her  EKG was consistent with an ST-elevation MI, and her symptoms were  ongoing at 07/10.  She is being taken urgently to the cath lab.   [name] states that she has had these symptoms before.  She  describes the pain as both somebody sitting on her chest and an aching  pain in her chest.  This is associated with shortness of breath.  It was  initially 10/10, but after receiving 1 sublingual nitroglycerin, it is a  07/10.  The pain has been continuous since approximately 10 a.m. and it  is currently 1:46 p.m.   PAST MEDICAL HISTORY:  1. Hypertension.  2. History of cerebral palsy.  3. Depression.  4. Bipolar disorder.  5. Ongoing tobacco use.  6. Family history of premature coronary artery disease.   SURGICAL HISTORY:  She is status post total abdominal hysterectomy as  well as knee surgery.  Other lower extremity surgeries bilaterally, C-  section x2, tubal ligation, and elective abortion x2.   SOCIAL HISTORY:  [place].  She smokes up to a pack a day  with approximately 25-pack-year  history.  She denies alcohol or drug abuse.  She is disabled.   FAMILY HISTORY:  Her mother died at age 96 of respiratory failure and  her father died at age 16 of an MI, but she has no siblings with  coronary artery disease   MEDICATIONS:  Per Med list is currently pending.   ALLERGIES:  PENICILLIN.   REVIEW OF SYSTEMS:  She has chronic arthralgias and pain.  She has  problems with depression and anxiety.  She has occasional reflux  symptoms.  She has had no fevers, chills or sweats.  She has been short  of breath with the pain and has some chronic dyspnea on exertion, but no  orthopnea, PND, edema or palpitations.  Full 14-point review of systems  is otherwise negative.   PHYSICAL EXAM:  GENERAL:  She is a well-developed [place]  female in acute distress.  HEENT:  Normal.  NECK:  There is no lymphadenopathy, thyromegaly, bruit, or JVD  noted.  CVA:  Heart is regular, rate, and rhythm with an S1and S2 and no  significant murmur or gallop is noted.  CHEST:  She has a few rales in the bases, but generally clear.  ABDOMEN:  Soft and nontender with active bowel sounds.  EXTREMITIES:  There is no cyanosis, clubbing, or edema noted.  Distal  pulses are 2+ in all 4 extremities.  There are questionable femoral  bruits bilateral.  MUSCULOSKELETAL:  There is no joint deformity or effusion and no spine  or CVA tenderness.  NEUROLOGIC:  She is alert and oriented.  Cranial nerves II-XII are  grossly intact.   Chest x-ray is pending.   EKG is sinus rhythm with approximately 1-2 mm of inferior ST-elevation  and reciprocal T-wave inversions in V1-V3.   Laboratory values are pending.   IMPRESSION:  1. Acute ST-elevation inferior MI:  She will be taken urgently to cath      lab with further evaluation and treatment depending on the results.      We will contact her pharmacy to determine her home medications and      continue these.  We will check  her lipid profile and thyroid-      stimulating hormone, and hemoglobin A1c.      Victoria Demark, PA-C      Bevelyn Buckles. Bensimhon, MD  Electronically Signed    RB/MEDQ  D:  02/17/2008  T:  02/18/2008  Job:  [id number]

## 2011-02-21 NOTE — Cardiovascular Report (Signed)
NAMEEMILYNN, [name]             ACCOUNT NO.:  [id number]   MEDICAL RECORD NO.:  [id number]          PATIENT TYPE:  INP   LOCATION:  2020                         FACILITY:  [facility]   PHYSICIAN:  Everardo Beals. Juanda Chance, MD, FACCDATE OF BIRTH:  June 08, 1966   DATE OF PROCEDURE:  02/17/2008  DATE OF DISCHARGE:  02/19/2008                            CARDIAC CATHETERIZATION   CLINICAL HISTORY:  Ms. Bodnar is 45 year old and was admitted with  chest pain and EKG changes consistent with an inferior infarction.  She  was brought promptly to the catheterization laboratory for intervention.   PROCEDURE:  The procedure was performed by right femoral artery,  arterial sheath, and a 6-French pyriform coronary catheter.  At the  completion of the diagnostic study, we made decision to proceed with  intervention of the totally occluded circumflex artery.   The patient was given antiemetics, bolus infusion, and was given 600 mg  Plavix and four chewable aspirins.  We used a CLS 3.5 guiding catheter  with side holes.  We crossed the lesion with a Prowater wire without  difficulty.  We initially predilated the lesion with a 2.25 x 20 mm apex  balloon performing one inflation up to 8 atmospheres for 30 seconds.  We  then deployed a 2.75 x 20 mm Liberte, a bare-metal stent with one  inflation up to 10 atmospheres for 30 seconds.  We then postdilated with  a 2.75 x 50 mm Quantum Maverick performing 2 inflations of up to 16  atmospheres for 30 seconds.  Final diagnostics were then performed  through the guiding catheter.  The patient tolerated the procedure well  and left the laboratory in satisfactory condition.  We performed  intravascular ultrasound after deployment of the stent.   RESULTS:  Left main coronary artery:  The left main coronary artery was  free of disease.   Left anterior descending artery:  The left anterior descending artery  gave rise to 2 diagonal branches and 2 septal perforators.  The  LAD was  free of significant disease.   Circumflex artery:  The circumflex artery was a dominant vessel that  gave rise to a ramus branch, and marginal branch and then was completely  occluded.   Right coronary artery:  The right coronary was a nondominant vessel  supplied to right ventricle branches.   Left ventriculogram:  The left ventriculogram was performed on the RAO  projection showed hypokinesis of the inferobasal segment.  The overall  wall motion was good with an estimated ejection fraction of 55%.   Left ventriculogram:  The left ventriculogram performed on LAO  projection showed good wall motion with no areas of hypokinesis.   Following the PTCA and stenting of the lesion in mid circumflex artery,  the stenosis grew from 100%-0%.  The flow improved from TIMI 2 to TIMI 3  flow.  Distal vessel consisted of 2 moderately-sized posterolateral  branches.  A subbranch of one of these posterolateral branches had an  80% stenosis.   IVUS performed after stent implantation showed a distal reference  diameter of 2.75 and minimal stent diameter of 2.6 and a  proximal  reference diameter of 2.6.   The patient had the onset of chest pain at 10 a.m. and arrived at [facility] Emergency Room at 12:36.  She arrived at Cath Lab at 1342 and the  first presentation was at 1414.  This gave a door-balloon time of 98  minutes and refusion time of 4 hours and 14 minutes.   CONCLUSION:  1. Acute diaphragmatic wall infarction with total occlusion of the mid      circumflex artery, no major obstruction in the left anterior      descending artery, and no major obstruction to small nondominant      right coronary with inferobasal wall hypokinesis.  2. Successful PCI of the lesion in the mid circumflex artery with      improvement in center narrowing from 100%-0% and improvement of      flow from TIMI 2 to TIMI 3 flow using a Liberte bare-metal stent.   DISPOSITION:  The patient returned to  emergency room for further  observation.      Bruce Elvera Lennox Juanda Chance, MD, [facility]  Electronically Signed     BRB/MEDQ  D:  05/01/2008  T:  05/02/2008  Job:  [id number]   cc:   Bevelyn Buckles. Bensimhon, MD  Cardiopulmonary Lab

## 2011-02-24 NOTE — Op Note (Signed)
[facility]  Patient:    [name], [name] Visit Number: [id number] MRN: [id number]          Service Type: DSU Location: [facility] Attending Physician:  Jaymes Graff A Dictated by:   Pierre Bali Normand Sloop, M.D. Admit Date:  12/30/2001                             Operative Report  PREOPERATIVE DIAGNOSIS:       Endometrial polyp and submucosal component of                               anterior fibroid.  POSTOPERATIVE DIAGNOSIS:      Endometrial polyp.  OPERATION:  SURGEON:                      [name], M.D.  ANESTHESIA:                   General laryngeal mask airway.  IV FLUIDS:                    1500 cc of crystalloid.  ESTIMATED BLOOD LOSS:         Minimal.  URINE OUTPUT:                 50 cc.  COMPLICATIONS:                None.  FINDINGS:                     About eight week size anteverted uterus.  Normal adnexa bilateral.  Vulvovaginal was within normal limits.  There was two polyps visualized, one broad-based polyp on the posterior uterine wall and another on the right lateral uterine wall.  There were none seen anteriorly, just fluffy endometrium that was normal-appearing and no submucosal fibroids were seen.  Both ostia were visualized.  DESCRIPTION OF PROCEDURE:     The patient was taken to the operating room where she was given general anesthesia laryngeal mask airway, and put in the dorsolithotomy position, and prepped and draped in a normal sterile fashion. The bladder was drained of about 50 cc of urine.  The patient was then examined and found to have a notings outlined above.  A bivalve speculum was placed into the vagina.  The anterior lip of the cervix was grasped with a single tooth tenaculum.  The uterus was sound to 9 cm and removed.  The cervix was then dilated with Shawnie Pons dilators up to a dilation of 21.  The findings noted above were seen.  There were no submucosal fibroids seen.  Polyp forceps were then  placed into the uterine cavity and the polyps were removed without difficulty.  A sharp curettage was then done and moderate endometrial curettings were obtained.  A second hook was then done to make sure that the polyps were removed and each polyp was successfully removed.  Again, both ostia were visualized, and again, no submucosal fibroids were seen anteriorly, posteriorly, or on either lateral wall, or in the cervix.  All sides of the uterus were seen.  All instruments were removed from the vagina.  The patient had a little bit of bleeding from the tenaculum site on the patients left which was made hemostatic using pressure and silver nitrate sticks.  Sponge, lap, and needle  counts were correct x 2.  The patient went to the recovery room in stable condition. Dictated by:   Pierre Bali. Normand Sloop, M.D. Attending Physician:  Michael Litter DD:  12/30/01 TD:  12/31/01 Job: [id number]

## 2011-02-24 NOTE — Discharge Summary (Signed)
   NAME:  [name], [name]                       ACCOUNT NO.:  [id number]   MEDICAL RECORD NO.:  [id number]                   PATIENT TYPE:  INP   LOCATION:  9327                                 FACILITY:  [facility]   PHYSICIAN:  Naima A. Dillard, M.D.              DATE OF BIRTH:  November 11, 1965   DATE OF ADMISSION:  04/07/2003  DATE OF DISCHARGE:  04/09/2003                                 DISCHARGE SUMMARY   DISCHARGE MEDICATIONS:  1. Motrin.  2. Percocet.  3. Phenergan.   DISCHARGE INSTRUCTIONS:  The patient was told to remain on pelvic rest.  Told to keep her incision clean and dry.  She has specific instructions for  postoperative care and when to call, and have given her written  instructions.  She is to return on April 14, 2003, at 2:15 p.m. for staple  removal.   HOSPITAL COURSE:  The patient underwent a total abdominal hysterectomy on  April 07, 2003, for menorrhagia, dysmenorrhea, and pelvic pain.  The patient  remained afebrile with stable vital signs.  Bowel function returned to  normal on postoperative day #1 and was tolerating a regular diet.  Her  examination remained benign.   LABORATORY DATA:  Her preoperative hemoglobin was 13.3, postoperative  hemoglobin 10.5, platelets 219,000.  Postoperative BUN 5, postoperative  creatinine 0.7.   The patient states that she is ready to go home.  She is ambulating well.  Her vital signs are stable.  Abdomen is soft with good bowel sounds.  The  incision is clean, dry, and intact.  She is passing gas and has no problems.  She will be discharged home to follow up in the office on April 14, 2003, for  staple removal.  Instructions and prescriptions were given.                                               [name], M.D.    NAD/MEDQ  D:  04/09/2003  T:  04/09/2003  Job:  [id number]

## 2011-02-24 NOTE — Op Note (Signed)
NAME:  [name], [name]                       ACCOUNT NO.:  [id number]   MEDICAL RECORD NO.:  [id number]                   PATIENT TYPE:  INP   LOCATION:  9327                                 FACILITY:  [facility]   PHYSICIAN:  Naima A. Dillard, M.D.              DATE OF BIRTH:  Oct 18, 1965   DATE OF PROCEDURE:  DATE OF DISCHARGE:                                 OPERATIVE REPORT   PREOPERATIVE DIAGNOSIS:  Symptomatic fibroid.   POSTOPERATIVE DIAGNOSIS:  Symptomatic fibroid.   PROCEDURE:  Total abdominal hysterectomy.   ANESTHESIA:  General endotracheal tube anesthesia.   SURGEON:  [name], M.D.   ASSISTANT:  Dois Davenport A. Rivard, M.D.   ESTIMATED BLOOD LOSS:  200 mL.   FLUIDS REPLACED:  IV fluids were 2000 mL.   URINE OUTPUT:  100 mL clear urine.   COMPLICATIONS:  None.   FINDINGS:  An 8-week size uterus with several small fibroids.  Normal tubes  and ovaries bilaterally.  Adhered bladder cervical adhesions.   DESCRIPTION OF PROCEDURE:  The patient was taken to the operating room where  she was given general anesthesia.  We decided to do an abdominal  hysterectomy secondary to the patient having a contracted pelvis.  The  patient was offered attempt at vaginal hysterectomy but she declined.  The  patient was prepped and draped in normal sterile fashion.  Foley catheter  was placed.  A Pfannenstiel skin incision was then made along her other  incisions and carried down to the fascia. The fascia was incised and  extended midline and extended bilaterally using Bovie cautery.  Kochers x2  were placed in the superior __________ fascia which was dissected off of the  rectus muscle both sharply and bluntly.  Attention was turned to the  inferior aspect of the rectus muscle which was dissected off sharply with  Bovie cautery and bluntly.  Hemostasis was noted.  Rectus muscles were  separated in the midline.  The peritoneum was incised superiorly and  inferiorly and good  visualization of the bowel and bladder.  Lenox Ahr retractors placed.  The bowel was packed with moist laparotomy  sponges.  Two __________ were placed on the cornua to elevate the uterus.  The round ligaments were clamped, suture ligated with 0 Vicryl and  transected.  Hemostasis was assured.  The bladder flap was then entered  sharply with Metzenbaum scissors and extended using Metzenbaum scissors.  Sharp dissection of the bladder from the cervix was done with Metzenbaum  scissors, DeBakey and Bovie cautery.  There was some oozing from the bladder  wall which was made hemostatic using Bovie cautery.  The utero-ovarian  ligaments were clamped, transected and suture ligated, first with free tie  and then with suture.  Hemostasis was assured.  The uterine arteries were  skeletonized bilaterally, clamped with Heaney clamps, cut and suture  ligated.  Hemostasis was assured.  Bladder was further  dissected away from  the cervix.  The cardinal and uterosacral ligaments were clamped, transected  and suture ligated.  The uterus was removed with Satinsky  scissors.  The  vaginal cuff was closed using 0 Vicryl.  Copious irrigation was then done.  Any bleeding areas along the bladder during removal of dense adhesions were  made hemostatic with Bovie cautery.  A Gelfoam was placed in between the  bladder and the vaginal cuff.  There was also a small hematoma and some  bleeding on the serosa of the patient's right fallopian tube.  A Gelfoam was  placed there too.  Again, before the Gelfoam was placed, irrigation was done  and hemostasis was noted.  All instruments were removed from the abdomen.  Sponge, lap and needle counts correct x 2.  The fascia was closed with 0  Vicryl in a running fashion to the midline from both sides.  Skin was closed  with staples.  The patient went to the recovery room in stable condition.                                               [name],  M.D.    NAD/MEDQ  D:  04/07/2003  T:  04/07/2003  Job:  [id number]

## 2011-02-24 NOTE — H&P (Signed)
NAME:  [name], [name]                       ACCOUNT NO.:  [id number]   MEDICAL RECORD NO.:  [id number]                   PATIENT TYPE:  INP   LOCATION:  NA                                   FACILITY:  [facility]   PHYSICIAN:  Naima A. Dillard, M.D.              DATE OF BIRTH:  [birth year]   DATE OF ADMISSION:  04/06/2003  DATE OF DISCHARGE:                                HISTORY & PHYSICAL   HISTORY OF PRESENT ILLNESS:  The patient is a 45 year old African-American  female, gravida 4, para 2-0-0-2, with last menstrual period of March 20, 2003.  The patient has decided upon hysterectomy for longterm treatment of  menometrorrhagia and dysmenorrhea.  In March of 2003, the patient underwent  a D&C, hysteroscopy, and polypectomy for menorrhagia. She was then placed on  Depo-Provera. She did not like the side effects of Depo-Provera. She was  then placed on an Ovcon taper for irregular bleeding and the patient has  decided on definitive treatment. She was given other options such as Lupron,  birth control pills, cryoablation, uterine artery embolization.  The patient  has chosen definitive treatment.  On ultrasound, she has a uterus measuring  9.5 x 4.9 x 5.0 cm. She has three fibroids present, the largest being 3 cm.  Normal right ovary.  The patient also has dysmenorrhea with her periods and  uses Vicodin for pain. Motrin did not help her.   PAST MEDICAL HISTORY:  Significant for cerebral palsy, depression, and  bipolar disorder.   PAST SURGICAL HISTORY:  Bilateral leg surgeries, LEEP surgery, and cesarean  section x2, and tubal ligation. Also elective abortion x2 in the first  trimester without complications.   PAST GYN HISTORY:  Significant for a history of Trichomonas, Chlamydia, and  HPV, genital warts, all treated. The patient denies any history of abnormal  Pap.  She currently does not use any condoms. Her periods occur every 20  days and lasting for 5 to 7 days for 15 years. She  does have dysmenorrhea  for which she takes Vioxx or Vicodin for relief.   FAMILY HISTORY:  Unremarkable for diabetes, high blood pressure, or breast  cancer.   MEDICATIONS:  1. Norvasc.  2. Flowoxitene.  3. Combivir.  4. Prozac.  5. Albuterol inhaler.   SOCIAL HISTORY:  The patient smokes 1/2 pack of cigarettes per day. Denies  any alcohol or drug use.   REVIEW OF SYSTEMS:  MUSCULOSKELETAL: The patient has cerebral palsy.  GENITOURINARY: As above. CARDIOVASCULAR:  Unremarkable. RESPIRATORY:  Significant for asthma.  PSYCHIATRIC: Significant for bipolar disorder.   PHYSICAL EXAMINATION:  VITAL SIGNS: Blood pressure 120/70, weight 181  pounds.  HEENT:  Pupils are equal, hearing is normal, and throat is clear.  Thyroid  is not enlarged.  HEART:  Regular rate and rhythm.  LUNGS:  Clear to auscultation bilaterally.  HEART:  No masses, nipple discharge, skin changes, or nipple  retraction  bilaterally.  BACK:  No CVA tenderness bilaterally.  ABDOMEN:  Nontender, soft, without organomegaly.  EXTREMITIES:  Somewhat contracted, but mobile.  NEUROLOGY:  Within normal limits.  There are some decreased tone in the  lower extremities hyperreflexia.  PELVIC: Vaginal examination was within normal limits. Cervix was nontender  without lesions. Uterus is about nine weeks size, slightly irregular,  mobile, nontender. Adnexa has no masses.  ABDOMEN:  Dysmenorrhea with symptomatic fibroids.   PLAN:  Total abdominal hysterectomy.  The patient was offered to try vaginal  hysterectomy laparoscopically-assisted due to her cesarean sections and  contracted pelvis.  The patient desires an abdominal hysterectomy, stating  she did not want to attempt a vaginal hysterectomy. She understands that all  medical and surgical treatments are possible.  She understands that this  will render her sterile. She understands the risks are, but not limited to,  bleeding, infection, damage to internal organs such  as bowel, bladder, and  major blood vessels.                                               [name], M.D.    NAD/MEDQ  D:  04/06/2003  T:  04/06/2003  Job:  [id number]

## 2011-04-13 ENCOUNTER — Encounter: Payer: Medicare Other | Admitting: Internal Medicine

## 2011-05-12 ENCOUNTER — Encounter: Payer: Medicare Other | Admitting: Internal Medicine

## 2011-06-02 ENCOUNTER — Emergency Department (HOSPITAL_COMMUNITY): Payer: Medicare Other

## 2011-06-02 ENCOUNTER — Inpatient Hospital Stay (HOSPITAL_COMMUNITY)
Admission: EM | Admit: 2011-06-02 | Discharge: 2011-06-05 | DRG: 247 | Disposition: A | Discharge status: Home / Self Care | Payer: Medicare Other | Attending: Internal Medicine | Admitting: Internal Medicine

## 2011-06-02 DIAGNOSIS — E876 Hypokalemia: Secondary | ICD-10-CM | POA: Diagnosis present

## 2011-06-02 DIAGNOSIS — Z7982 Long term (current) use of aspirin: Secondary | ICD-10-CM

## 2011-06-02 DIAGNOSIS — Z880 Allergy status to penicillin: Secondary | ICD-10-CM

## 2011-06-02 DIAGNOSIS — Z79899 Other long term (current) drug therapy: Secondary | ICD-10-CM

## 2011-06-02 DIAGNOSIS — G809 Cerebral palsy, unspecified: Secondary | ICD-10-CM | POA: Diagnosis present

## 2011-06-02 DIAGNOSIS — F319 Bipolar disorder, unspecified: Secondary | ICD-10-CM | POA: Diagnosis present

## 2011-06-02 DIAGNOSIS — I214 Non-ST elevation (NSTEMI) myocardial infarction: Principal | ICD-10-CM | POA: Diagnosis present

## 2011-06-02 DIAGNOSIS — I10 Essential (primary) hypertension: Secondary | ICD-10-CM | POA: Diagnosis present

## 2011-06-02 DIAGNOSIS — R079 Chest pain, unspecified: Secondary | ICD-10-CM

## 2011-06-02 DIAGNOSIS — I2582 Chronic total occlusion of coronary artery: Secondary | ICD-10-CM | POA: Diagnosis present

## 2011-06-02 DIAGNOSIS — I2510 Atherosclerotic heart disease of native coronary artery without angina pectoris: Secondary | ICD-10-CM | POA: Diagnosis present

## 2011-06-02 DIAGNOSIS — Z8249 Family history of ischemic heart disease and other diseases of the circulatory system: Secondary | ICD-10-CM

## 2011-06-02 DIAGNOSIS — K219 Gastro-esophageal reflux disease without esophagitis: Secondary | ICD-10-CM | POA: Diagnosis present

## 2011-06-02 DIAGNOSIS — E785 Hyperlipidemia, unspecified: Secondary | ICD-10-CM | POA: Diagnosis present

## 2011-06-02 DIAGNOSIS — Z9861 Coronary angioplasty status: Secondary | ICD-10-CM

## 2011-06-02 DIAGNOSIS — I252 Old myocardial infarction: Secondary | ICD-10-CM

## 2011-06-02 DIAGNOSIS — F17200 Nicotine dependence, unspecified, uncomplicated: Secondary | ICD-10-CM | POA: Diagnosis present

## 2011-06-02 DIAGNOSIS — Z7902 Long term (current) use of antithrombotics/antiplatelets: Secondary | ICD-10-CM

## 2011-06-02 LAB — COMPREHENSIVE METABOLIC PANEL
Albumin: 4.4 g/dL (ref 3.5–5.2)
BUN: 6 mg/dL (ref 6–23)
Calcium: 10.1 mg/dL (ref 8.4–10.5)
Creatinine, Ser: 0.63 mg/dL (ref 0.50–1.10)
GFR calc Af Amer: >60 mL/min (ref >60)
Glucose, Bld: 127 mg/dL — ABNORMAL HIGH (ref 70–99)
Total Protein: 8.1 g/dL (ref 6.0–8.3)

## 2011-06-02 LAB — CBC
Hemoglobin: 14.3 g/dL (ref 12.0–15.0)
MCH: 30.8 pg (ref 26.0–34.0)
MCHC: 34.5 g/dL (ref 30.0–36.0)
Platelets: 231 10*3/uL (ref 150–400)
RDW: 14 % (ref 11.5–15.5)

## 2011-06-02 LAB — POCT I-STAT TROPONIN I: Troponin i, poc: 0.89 ng/mL (ref 0.00–0.08)

## 2011-06-03 DIAGNOSIS — I2510 Atherosclerotic heart disease of native coronary artery without angina pectoris: Secondary | ICD-10-CM

## 2011-06-03 LAB — PROTIME-INR
INR: 0.98 (ref 0.00–1.49)
INR: 0.98 (ref 0.00–1.49)
Prothrombin Time: 13.2 seconds (ref 11.6–15.2)
Prothrombin Time: 13.2 seconds (ref 11.6–15.2)

## 2011-06-03 LAB — CBC
HCT: 35.7 % — ABNORMAL LOW (ref 36.0–46.0)
MCH: 30.3 pg (ref 26.0–34.0)
MCV: 89.5 fL (ref 78.0–100.0)
Platelets: 237 10*3/uL (ref 150–400)
RDW: 14 % (ref 11.5–15.5)
WBC: 9.9 10*3/uL (ref 4.0–10.5)

## 2011-06-03 LAB — CK TOTAL AND CKMB (NOT AT ARMC)
CK, MB: 26.4 ng/mL (ref 0.3–4.0)
CK, MB: 31.5 ng/mL (ref 0.3–4.0)
Relative Index: 8.3 — ABNORMAL HIGH (ref 0.0–2.5)
Total CK: 333 U/L — ABNORMAL HIGH (ref 7–177)
Total CK: 495 U/L — ABNORMAL HIGH (ref 7–177)

## 2011-06-03 LAB — TSH: TSH: 0.963 u[IU]/mL (ref 0.350–4.500)

## 2011-06-03 LAB — BASIC METABOLIC PANEL
Calcium: 9.5 mg/dL (ref 8.4–10.5)
GFR calc non Af Amer: >60 mL/min (ref >60)
Potassium: 3.4 mEq/L — ABNORMAL LOW (ref 3.5–5.1)
Sodium: 143 mEq/L (ref 135–145)

## 2011-06-03 LAB — LIPID PANEL
Cholesterol: 196 mg/dL (ref 0–200)
Triglycerides: 116 mg/dL (ref <150)

## 2011-06-03 LAB — TROPONIN I: Troponin I: 7.55 ng/mL (ref <0.30)

## 2011-06-04 DIAGNOSIS — I214 Non-ST elevation (NSTEMI) myocardial infarction: Secondary | ICD-10-CM

## 2011-06-04 LAB — BASIC METABOLIC PANEL
BUN: 6 mg/dL (ref 6–23)
Chloride: 108 mEq/L (ref 96–112)
Creatinine, Ser: 0.51 mg/dL (ref 0.50–1.10)
GFR calc Af Amer: >60 mL/min (ref >60)
GFR calc non Af Amer: >60 mL/min (ref >60)

## 2011-06-04 LAB — CBC
MCH: 30.6 pg (ref 26.0–34.0)
MCHC: 33.9 g/dL (ref 30.0–36.0)
MCV: 90.2 fL (ref 78.0–100.0)
Platelets: 210 10*3/uL (ref 150–400)
RDW: 14.2 % (ref 11.5–15.5)

## 2011-06-06 NOTE — Cardiovascular Report (Signed)
[name], [name]             ACCOUNT NO.:  [id number]  MEDICAL RECORD NO.:  [id number]  LOCATION:  2924                         FACILITY:  [facility]  PHYSICIAN:  Verne Carrow, MDDATE OF BIRTH:  1965/12/26  DATE OF PROCEDURE:  06/03/2011 DATE OF DISCHARGE:                           CARDIAC CATHETERIZATION   PRIMARY CARDIOLOGIST:  Bevelyn Buckles. Bensimhon, MD  PROCEDURE PERFORMED: 1. Left heart catheterization 2. Selective coronary angiography. 3. Left ventricular angiogram. 4. PTCA with placement of a drug-eluting stent in the mid circumflex     artery.  OPERATOR:  Verne Carrow, MD  INDICATIONS:  This is a 45 year old African American female with a past medical history significant for hypertension, bipolar disorder, and coronary artery disease with placement of a bare metal stent in her totally occluded midcircumflex artery by Dr. Charlies Constable in May 2009. The patient has continued to abuse tobacco and has not taken her cardiac medications as advised.  She was readmitted to the hospital last night with complaints of chest discomfort.  Over the last 12 hours, her cardiac enzymes have risen significantly.  She has had recurrent episodes of chest pain here in the hospital today.  Because of this, an urgent cardiac catheterization was arranged.  PROCEDURE IN DETAIL:  The patient was brought to the main cardiac catheterization laboratory after signing informed consent for the procedure.  The right groin was prepped and draped in sterile fashion. A 1% lidocaine was used for local anesthesia.  A 6-French sheath was inserted into the right femoral artery without difficulty.  Standard diagnostic catheters were used to perform selective coronary angiography.  A pigtail catheter was used to perform a left ventricular angiogram.  The patient was given a bolus of Angiomax and drip was started.  The patient was given 600 mg of Plavix.  A 6-French XB 3.0 guiding catheter was  used to selectively engage the left main artery. When ACT was greater than 200, we passed a cougar intracoronary wire down the length of circumflex artery and into the distal obtuse marginal branch.  There was some reperfusion in the totally occluded vessel with passing the wire.  A 2.5 x 15-mm balloon was inflated 3 times inside the area of total occlusion.  Excellent flow was reestablished into the distal vessel after balloon inflation.  A 2.75 x 32-mm Promus Element drug-eluting stent was positioned in the mid vessel making sure to cover the previously placed bare metal stent.  A long stent was used as there was significant disease on either side of the prior placed stent.  This stent was deployed without difficulty.  A 2.75 x 20-mm noncompliant balloon was inflated twice inside the stented segment.  The stenosis was taken from 100% to 0%.  There was excellent flow into the distal vessel. The patient tolerated the procedure well.  There were no immediate complications.  An angiogram was performed in the femoral artery, however, the artery was too small for closure device.  HEMODYNAMIC FINDINGS:  Central aortic pressure 113/67, left ventricular pressure 104/7/17.  ANGIOGRAPHIC FINDINGS: 1. The left main coronary artery had mild 10% plaque. 2. The left anterior descending was a large vessel that coursed to the     apex.  There were luminal irregularities throughout the proximal     and mid LAD.  There were 2 moderate-sized diagonal branches.  They     had mild luminal irregularities. 3. The circumflex artery gave off an early obtuse marginal branch that     had an ostial 40% stenosis.  The second obtuse marginal branch had     40% mid stenosis.  Just beyond the takeoff of the second obtuse     marginal branch, the mid circumflex artery was totally occluded at     the site of the prior bare metal stent.  There was no flow into the     distal circumflex artery. 4. The right coronary  artery was a small nondominant vessel with 50%     lesions.  However, these do not appear to be flow-limiting. 5. Left ventricular angiogram was performed in the RAO projection and     showed low normal left ventricular systolic function with ejection     fraction of 45-50%.  IMPRESSION: 1. Single vessel coronary artery disease. 2. Low normal left ventricular systolic function. 3. Non-ST-elevation myocardial infarction secondary to occluded     midcircumflex artery at the site of the prior placed bare metal     stent. 4. Successful percutaneous transluminal coronary angioplasty with     placement of a drug-eluting stent in the mid circumflex artery.  RECOMMENDATIONS:  The patient should be continued on aspirin and Plavix for at least 1 year.  We will continue her beta-blocker and statin. Complete tobacco cessation is encouraged.     Verne Carrow, MD     CM/MEDQ  D:  06/03/2011  T:  06/03/2011  Job:  [id number]  cc:   Bevelyn Buckles. Bensimhon, MD  Electronically Signed by Verne Carrow MD on 06/06/2011 03:16:20 PM

## 2011-06-09 ENCOUNTER — Encounter: Payer: Medicare Other | Admitting: Internal Medicine

## 2011-06-13 ENCOUNTER — Other Ambulatory Visit (HOSPITAL_COMMUNITY): Payer: Self-pay | Admitting: Internal Medicine

## 2011-06-15 NOTE — Discharge Summary (Signed)
[name], [name]             ACCOUNT NO.:  [id number]  MEDICAL RECORD NO.:  [id number]  LOCATION:  2025                         FACILITY:  [facility]  PHYSICIAN:  Hillis Range, MD       DATE OF BIRTH:  March 25, 1966  DATE OF ADMISSION:  06/02/2011 DATE OF DISCHARGE:  06/05/2011                              DISCHARGE SUMMARY   PROCEDURES: 1. Cardiac catheterization. 2. Coronary arteriogram. 3. Left ventriculogram. 4. Percutaneous transluminal coronary angioplasty and 2.75 x 32-mm     Promus drug-eluting stent to the mid circumflex. 5. Two-view chest x-ray.  PRIMARY FINAL DISCHARGE DIAGNOSIS:  Non-ST-segment elevation myocardial infarction.  SECONDARY DIAGNOSES: 1. History of depression and bipolar disorder. 2. Cerebral palsy. 3. Hypertension. 4. Gastroesophageal reflux disease. 5. Status post inferior ST-elevation myocardial infarction in May 2009     with bare-metal stent to the circumflex. 6. Hypokalemia. 7. Hyperlipidemia. 8. History of fibroid status post total hysterectomy. 9. Tobacco use. 10.Family history of premature coronary artery disease. 11.Status post knee surgery and other lower extremity surgeries     bilaterally, cesarean section, tubal ligation, and elective     abortion 12.Allergy or intolerance to PENICILLIN.  TIME AT DISCHARGE:  39 minutes.  HOSPITAL COURSE:  [name] is a 45 year old female with a history of coronary artery disease.  She had some back pain that was associated with some vomiting and mild diarrhea.  She came to the emergency room where EKG showed no acute ST elevation but her initial troponin was elevated.  She was admitted for further evaluation and treatment.  Her peak CK-MB was 465/38.7 with a troponin of 8.1.  Total cholesterol 196, triglycerides 116, HDL 39, LDL 134.  TSH within normal limits.  She had ongoing pain and was taken to the cath lab on June 03, 2011.  The cardiac catheterization showed luminal irregularities  in the LAD, OM-1 and OM-2 with 40%, RCA 50% with the total occlusion in the mid circumflex within the area of the previously placed stent.  This was treated with PTCA and a drug-eluting stent described above which was placed to cover the previously placed bare-metal stent.  The stenosis was reduced to 0.  All other areas had TIMI-3 flow.  Her EF was 45-50%.  [name] was counseled on smoking cessation and seen by Cardiac Rehab.  Her potassium was slightly low after the hydration for the cath, and this was supplemented.  She had a statin added to her medication regimen.  Assistance will be pursued for Plavix.  On June 05, 2011, [name] was seen by Dr. Johney Frame.  She was ambulating without chest pain or shortness of breath and considered stable for discharge, to follow up as an outpatient.  DISCHARGE INSTRUCTIONS: 1. Activity level is to be increased gradually. 2. She is encouraged to stick to a low-sodium heart-healthy diet. 3. She is not to use tobacco. 4. She is to call our office for problems with the cath site. 5. She is to follow up with Tereso Newcomer, PA-C, for Dr. Gala Romney in     approximately 2 weeks and get an echo at that time.  She is to     follow up with Patrcia Dolly  [facility] as needed.  DISCHARGE MEDICATIONS: 1. Tylenol 325 mg 1-2 tablets q.4 h. p.r.n. 2. Celexa 20 mg a day. 3. Trazodone 100 mg nightly p.r.n. 4. Ambien nightly p.r.n. 5. Toprol-XL 25 mg a day. 6. Baclofen 10 mg one-half tablet q.8 h. p.r.n. 7. Pravachol 40 mg daily. 8. Famotidine 40 mg b.i.d. 9. Sublingual nitroglycerin p.r.n. 10.Aspirin 81 mg a day. 11.Plavix 75 mg a day. 12.Cetirizine 10 mg daily p.r.n.     Theodore Demark, PA-C   ______________________________ Hillis Range, MD    RB/MEDQ  D:  06/05/2011  T:  06/05/2011  Job:  [id number]  cc:   [facility]  Electronically Signed by Theodore Demark PA-C on 06/13/2011 02:01:51 PM Electronically Signed by  Hillis Range MD on 06/15/2011 08:53:23 AM

## 2011-06-19 ENCOUNTER — Encounter: Payer: Medicare Other | Admitting: Physician Assistant

## 2011-06-20 ENCOUNTER — Ambulatory Visit (INDEPENDENT_AMBULATORY_CARE_PROVIDER_SITE_OTHER): Payer: Medicare Other | Admitting: Physician Assistant

## 2011-06-20 ENCOUNTER — Encounter: Payer: Self-pay | Admitting: Physician Assistant

## 2011-06-20 ENCOUNTER — Ambulatory Visit (HOSPITAL_COMMUNITY): Payer: Medicare Other | Attending: Internal Medicine

## 2011-06-20 ENCOUNTER — Encounter: Payer: Self-pay | Admitting: *Deleted

## 2011-06-20 DIAGNOSIS — I379 Nonrheumatic pulmonary valve disorder, unspecified: Secondary | ICD-10-CM | POA: Insufficient documentation

## 2011-06-20 DIAGNOSIS — R001 Bradycardia, unspecified: Secondary | ICD-10-CM

## 2011-06-20 DIAGNOSIS — I10 Essential (primary) hypertension: Secondary | ICD-10-CM | POA: Insufficient documentation

## 2011-06-20 DIAGNOSIS — I059 Rheumatic mitral valve disease, unspecified: Secondary | ICD-10-CM | POA: Insufficient documentation

## 2011-06-20 DIAGNOSIS — I214 Non-ST elevation (NSTEMI) myocardial infarction: Secondary | ICD-10-CM

## 2011-06-20 DIAGNOSIS — R079 Chest pain, unspecified: Secondary | ICD-10-CM | POA: Insufficient documentation

## 2011-06-20 DIAGNOSIS — I2510 Atherosclerotic heart disease of native coronary artery without angina pectoris: Secondary | ICD-10-CM

## 2011-06-20 DIAGNOSIS — E785 Hyperlipidemia, unspecified: Secondary | ICD-10-CM | POA: Insufficient documentation

## 2011-06-20 DIAGNOSIS — G809 Cerebral palsy, unspecified: Secondary | ICD-10-CM | POA: Insufficient documentation

## 2011-06-20 DIAGNOSIS — R072 Precordial pain: Secondary | ICD-10-CM | POA: Insufficient documentation

## 2011-06-20 DIAGNOSIS — I498 Other specified cardiac arrhythmias: Secondary | ICD-10-CM

## 2011-06-20 DIAGNOSIS — I079 Rheumatic tricuspid valve disease, unspecified: Secondary | ICD-10-CM | POA: Insufficient documentation

## 2011-06-20 HISTORY — DX: Bradycardia, unspecified: R00.1

## 2011-06-20 MED ORDER — PANTOPRAZOLE SODIUM 40 MG PO TBEC: 40.0000 mg | DELAYED_RELEASE_TABLET | Freq: Every day | ORAL | Status: DC

## 2011-06-20 NOTE — Assessment & Plan Note (Signed)
She feels tired.  Decrease Toprol to one half tablet daily.

## 2011-06-20 NOTE — Assessment & Plan Note (Signed)
Proceed with Myoview as noted.  Stop Pepcid.  Start Protonix 40 mg daily.  She may need referral to gastroenterology if her symptoms do not improve and her Myoview is nonischemic.

## 2011-06-20 NOTE — Assessment & Plan Note (Signed)
DC Pepcid and start protonix as noted above.

## 2011-06-20 NOTE — Progress Notes (Signed)
History of Present Illness: Primary Cardiologist:  Dr. Arvilla Meres  [name] is a 45 y.o. female who presents for post hospital follow up.  She has a history of CAD, status post bare-metal stent to the circumflex in 2009 in the setting of inferior STEMI, hypertension and hyperlipidemia as well as cerebral palsy and bipolar disorder.    She was admitted 8/24-A./27.  She presented with back pain.  She ruled in for NSTEMI.  She underwent cardiac catheterization 8/25:  LM 10%, LAD lum. irregs., oOM1 40%, mOM2 40%, mCFX stent occluded, RCA 50%, EF 45-50%.  She underwent PCI with placement of a DES to mCFX 2/2 in-stent restenosis.  She was placed on pravastatin.   Labs: Hemoglobin 12.2, potassium 4.2, creatinine 0.51, ALT 12, TC 196, TE 116, HDL 39, LDL 134, TSH 0.963.  Her troponin peaked at 8.10.  Chest x-ray demonstrated mild COPD without acute findings.   Overall, doing well.  However, she still notes a fullness in her chest.  She actually points to her epigastrium and lower sternum.  She describes it as "pins and needles."  It comes and goes.  It is nonexertional.  No radiation or associated nausea, diaphoresis or dyspnea.  She denies orthopnea, PND or edema.  She does feel tired.  No syncope or near syncope.  She only eats one meal a day.  She drinks a lot of coffee.  She wonders if it is GERD.  Only takes Pepcid once a day.  No dysphagia, melena or hematochezia.  No vomiting or diarrhea.  He symptoms were present with her MI.  But, she had severe back pain and shoulder pain with her MI and this is resolved.  No exertional symptoms.    Past Medical History  Diagnosis Date  . Coronary artery disease 02/2008    a. s/p INF STEMI 5/09 tx with BMS to CFX;  b. Lexiscan Myoview 12/10 -  low risk, no ischemia.;   c. s/p NSTEMI 8/12: cath with LM 10%, LAD lum. irregs., oOM1 40%, mOM2 40%, mCFX stent occluded, RCA 50%, EF 45-50%; PCI - s/p DES to mCFX   . Bipolar disorder      Followed by mental  health, who prescribe her bipolar meds  . Tobacco abuse   . Cerebral palsy      with history of neuropathic pain  and spasticity,  requiring long-term pain medications  . Hypertension   . GERD (gastroesophageal reflux disease)   . Vitamin D deficiency 11/2009     patient treated with high-dose vitamin D weekly x3 months,  levels were rechecked and it was determined that the patient continued to require high dose repletion of vitamin D.  . History of pelvic mass     Found on CT abd/ pelvis (04/2008) - 13.3 cm cystic pelvis mass, possibly left ovarian origin. To be followed at [facility].   Marland Kitchen HLD (hyperlipidemia)     Current Outpatient Prescriptions  Medication Sig Dispense Refill  . aspirin 81 MG tablet Take 81 mg by mouth daily.        . baclofen (LIORESAL) 10 MG tablet Take 1 tablet (10 mg total) by mouth every 8 (eight) hours as needed. For spasticity  60 each  1  . Cetirizine HCl (ZYRTEC ALLERGY) 10 MG CAPS Take 1 capsule (10 mg total) by mouth daily.  30 capsule  1  . citalopram (CELEXA) 20 MG tablet Take 20 mg by mouth daily.        . clopidogrel (  PLAVIX) 75 MG tablet Take 75 mg by mouth daily.        . famotidine (PEPCID) 40 MG tablet Take 1 tablet (40 mg total) by mouth 2 (two) times daily.  60 tablet  6  . fluticasone (FLONASE) 50 MCG/ACT nasal spray 1 spray by Nasal route daily.  16 g  2  . metoprolol succinate (TOPROL-XL) 25 MG 24 hr tablet Take 1 tablet (25 mg total) by mouth daily.  30 tablet  6  . nitroGLYCERIN (NITROSTAT) 0.4 MG SL tablet Place 0.4 mg under the tongue every 5 (five) minutes x 3 doses as needed.        . pravastatin (PRAVACHOL) 40 MG tablet Take 1 tablet (40 mg total) by mouth daily.  30 tablet  6  . traMADol (ULTRAM) 50 MG tablet Take 1 tablet (50 mg total) by mouth every 8 (eight) hours as needed. For pain  45 tablet  1  . traZODone (DESYREL) 100 MG tablet Take 100 mg by mouth at bedtime.        Marland Kitchen zolpidem (AMBIEN) 10 MG tablet Take 10 mg by mouth at bedtime as  needed.          Allergies: Allergies  Allergen Reactions  . Penicillins Hives    Social Hx:  She quit smoking.  ROS:  Please see the history of present illness.  All other systems reviewed and negative.   Vital Signs: BP 112/71  Pulse 47  Wt 175 lb (79.379 kg)  LMP 12/22/2007  PHYSICAL EXAM: Well nourished, well developed, in no acute distress HEENT: normal Neck: no JVD Vascular: no carotid bruits Cardiac:  normal S1, S2; RRR; no murmur Lungs:  clear to auscultation bilaterally, no wheezing, rhonchi or rales Abd: soft, nontender, no hepatomegaly Ext: no edema; RFA site without hematoma or bruit Skin: warm and dry Neuro:  CNs 2-12 intact, no focal abnormalities noted  EKG:  Sinus bradycardia, heart rate 45, normal axis, no ischemic changes  ASSESSMENT AND PLAN:

## 2011-06-20 NOTE — Progress Notes (Signed)
Pt.notified

## 2011-06-20 NOTE — Patient Instructions (Addendum)
Your physician recommends that you schedule a follow-up appointment in: 08/09/11 @ 8:30 to see Tereso Newcomer, PA-C   Your physician has recommended you make the following change in your medication: STOP TAKING PEPCID AND START TAKING PROTONIX 40 MG 1 CAP DAILY; DECREASE TOPROL TAKE 1/2 (HALF) TABLET DAILY  Your physician recommends that you return for lab work in: 08/09/11 for fasting liver/lipid panel 272.4 for hyperlipidemia  DECREASE CAFFEINE USE

## 2011-06-20 NOTE — Assessment & Plan Note (Signed)
Controlled.  

## 2011-06-20 NOTE — Assessment & Plan Note (Signed)
Arrange lipids and LFTs in 6-8 weeks.

## 2011-06-20 NOTE — Assessment & Plan Note (Signed)
Doing well post NSTEMI.  But, she is still having chest pain.  I suspect this is GERD.  But she had these same symptoms prior to her MI.  Her symptoms are much improved.  She does have some moderate disease in the RCA.  I will have her do a [employer].  If no ischemia, I would pursue tx of GERD.  I would like to see her back in 2 weeks to follow up.  But, she cannot afford the copays.  I will have her come back in 4-6 weeks.  We can touch base with her after her stress test to see if she is doing better.

## 2011-06-28 ENCOUNTER — Encounter: Payer: Self-pay | Admitting: *Deleted

## 2011-06-29 ENCOUNTER — Ambulatory Visit (INDEPENDENT_AMBULATORY_CARE_PROVIDER_SITE_OTHER): Payer: Medicare Other | Admitting: Internal Medicine

## 2011-06-29 ENCOUNTER — Encounter: Payer: Self-pay | Admitting: Internal Medicine

## 2011-06-29 VITALS — BP 126/83 | HR 68 | Temp 97.7°F | Ht 65.0 in | Wt 177.4 lb

## 2011-06-29 DIAGNOSIS — Z0000 Encounter for general adult medical examination without abnormal findings: Secondary | ICD-10-CM

## 2011-06-29 DIAGNOSIS — E559 Vitamin D deficiency, unspecified: Secondary | ICD-10-CM

## 2011-06-29 DIAGNOSIS — E785 Hyperlipidemia, unspecified: Secondary | ICD-10-CM

## 2011-06-29 DIAGNOSIS — K219 Gastro-esophageal reflux disease without esophagitis: Secondary | ICD-10-CM

## 2011-06-29 DIAGNOSIS — Z23 Encounter for immunization: Secondary | ICD-10-CM

## 2011-06-29 DIAGNOSIS — I2510 Atherosclerotic heart disease of native coronary artery without angina pectoris: Secondary | ICD-10-CM

## 2011-06-29 DIAGNOSIS — Z720 Tobacco use: Secondary | ICD-10-CM

## 2011-06-29 DIAGNOSIS — I10 Essential (primary) hypertension: Secondary | ICD-10-CM

## 2011-06-29 DIAGNOSIS — F17200 Nicotine dependence, unspecified, uncomplicated: Secondary | ICD-10-CM

## 2011-06-29 MED ORDER — TRAMADOL HCL 50 MG PO TABS: 50.0000 mg | ORAL_TABLET | Freq: Three times a day (TID) | ORAL | Status: DC | PRN

## 2011-06-29 MED ORDER — BACLOFEN 10 MG PO TABS: 10.0000 mg | ORAL_TABLET | Freq: Three times a day (TID) | ORAL | Status: DC | PRN

## 2011-06-29 MED ORDER — PANTOPRAZOLE SODIUM 40 MG PO TBEC: 40.0000 mg | DELAYED_RELEASE_TABLET | Freq: Every day | ORAL | Status: DC

## 2011-06-29 MED ORDER — CALCIUM CARBONATE-VITAMIN D 500-200 MG-UNIT PO TABS: 1.0000 | ORAL_TABLET | Freq: Two times a day (BID) | ORAL | Status: DC

## 2011-06-29 NOTE — Assessment & Plan Note (Signed)
Patient recently started on pravastatin.  Has scheduled cmp to be drawn by cardiology in 1-2 weeks.  No myalgias -continue pravastatin

## 2011-06-29 NOTE — Assessment & Plan Note (Signed)
Patient notes a history of vit D deficiency, last checked 1 year ago (abnormal), no vit D pills x8 months -check vit D level today -may need to restart vit D supplementation

## 2011-06-29 NOTE — Assessment & Plan Note (Signed)
Patient is actively trying to quit smoking after her NSTEMI 1 month ago, and has cut back to 1/4 PPD. -patient was congratulated on making this reduction, and encouraged to quit altogether -patient was offered a prescription for nicotine patch/gum, but refused, citing financial reasons -patient was referred to Social Work for smoking cessation tips

## 2011-06-29 NOTE — Assessment & Plan Note (Addendum)
Patient reports mild epigastric pain and heartburn, likely GERD-related.  She was prescribed Protonix by her cardiologist, but has not filled this prescription -start using Protonix, return to care if symptoms do not improve -discontinue using Famotidine

## 2011-06-29 NOTE — Patient Instructions (Addendum)
You are doing a great job trying to stop smoking!!  Quitting smoking can be a tough process, but the most important thing is to just keep trying! -our social worker will contact you to set up an appointment to discuss smoking cessation tips  For your acid reflux, start taking Protonix.  Take this medication when you wake up in the morning with WATER ONLY, then eat some food 30-60 minutes later.  We are checking a vitamin D level today.  Depending on the results, you may need to re-start taking vit D supplementation.  You expressed concerns about over the counter medications today.  To clarify, it is fine to take occasional over the counter tylenol formulations (like equate) for pain, and occasional formulations like Tylenol Cold and Flu for cold-type symptoms.  However, never take more than 4,000 mg of tylenol TOTAL in 1 day.  Your cardiologist has also recommended that you avoid compounds such as Sudafed and Robitussin.  Please return for a follow-up visit in 6 months.

## 2011-06-29 NOTE — Assessment & Plan Note (Signed)
-   flu shot given today 

## 2011-06-29 NOTE — Progress Notes (Signed)
HPI The patient is a 45 yo woman, history of CAD with an NSTEMI 1 month ago, vit D deficiency, HL, bipolar disorder, HTN, and GERD, presenting for a follow-up visit.  The patient was admitted 8/24 with an NSTEMI of the circumflex artery (the same artery affected by STEMI 2009, s/p bare metal stent at that time), and had a drug-eluting stent placed.  During that hospitalization, she was started on plavix and pravastatin.  She followed-up with Cardiology 1 week ago with resolution of chest pain symptoms, though with a new epigastric pain, thought to be GERD vs atypical chest pain, and has been scheduled for a stress test.  At that visit, she was also noted to have fatigue and bradycardia, and her toprol dose was decreased by half.  She currently notes no fatigue.  Today, the patient notes no chest pain.  She still notes some heartburn and a dull, 2/10, epigastric pain.  She was prescribed pantoprazole by her cardiologist at her last visit, but has not yet filled this prescription, and is currently using famotidine.  The patient notes that she has been trying to quit smoking since her most recent NSTEMI.  She has currently reduced her usage from 1 PPD to 1/4 PPD, but is having trouble making the final step of quitting altogether.  She is not interested in trying the nicotine patch at this time due to cost of this medication.  The patient's LDL was checked in-hospital, and found to be 134, and the patient was started on Pravastatin, which she has been taking.  The patient has a history of vit D deficiency, with a low lab value noted 1 year ago, but has not been taking vit D supplementation for the last 8 months, and is unsure if she needs to continue.  ROS: General: no fevers, chills, changes in weight, changes in appetite Skin: no rash HEENT: no blurry vision, hearing changes, sore throat Pulm: no dyspnea, coughing, wheezing CV: no chest pain, palpitations, shortness of breath Abd: see HPI, no  nausea/vomiting, diarrhea/constipation GU: no dysuria, hematuria, polyuria Ext: no arthralgias, myalgias Neuro: no weakness, numbness, or tingling  Filed Vitals:   06/29/11 1339  BP: 126/83  Pulse: 68  Temp: 97.7 F (36.5 C)    PEX General: alert, cooperative, and in no apparent distress HEENT: pupils equal round and reactive to light, vision grossly intact, oropharynx clear and non-erythematous  Neck: supple, no lymphadenopathy, JVD Lungs: clear to ascultation bilaterally, normal work of respiration, no wheezes, rales, ronchi Heart: regular rate and rhythm, no murmurs, gallops, or rubs Abdomen: soft, non-tender, non-distended, normal bowel sounds Msk: no joint edema, warmth, or erythema Extremities: 2+ DP/PT pulses bilaterally, no cyanosis, clubbing, or edema Neurologic: alert & oriented X3, cranial nerves II-XII intact, strength grossly intact, sensation intact to light touch  Assessment/Plan

## 2011-06-29 NOTE — Assessment & Plan Note (Signed)
NSTEMI 1 month ago, s/p DES placement, followed by Dr. Milas Kocher, recently seen by his office 1 week ago, sent for stress test due to new epigastric pain (?may represent atypical chest pain, but more likely GERD), has close follow-up with their office. -continue plavix -continue management per cardiology

## 2011-06-29 NOTE — Assessment & Plan Note (Signed)
Currently well-controlled.  Toprol XL recently decreased to 12.5 mg due to bradycardia and fatigue at Cardiologist's office.  Patient currently notes no fatigue -continue current regimen

## 2011-06-30 NOTE — Progress Notes (Signed)
i agree with Dr. Theora Gianotti assessment and plan.

## 2011-07-03 ENCOUNTER — Other Ambulatory Visit: Payer: Self-pay | Admitting: Internal Medicine

## 2011-07-03 ENCOUNTER — Other Ambulatory Visit: Payer: Self-pay | Admitting: *Deleted

## 2011-07-03 MED ORDER — METOPROLOL SUCCINATE ER 25 MG PO TB24: 12.5000 mg | ORAL_TABLET | Freq: Every day | ORAL | Status: DC

## 2011-07-03 MED ORDER — CLOPIDOGREL BISULFATE 75 MG PO TABS: 75.0000 mg | ORAL_TABLET | Freq: Every day | ORAL | Status: DC

## 2011-07-03 NOTE — Telephone Encounter (Signed)
Please forward to [facility] on [address]

## 2011-07-03 NOTE — Telephone Encounter (Signed)
Opened for info for PPA

## 2011-07-03 NOTE — Telephone Encounter (Signed)
Pt also wants rx for motroprolol

## 2011-07-06 LAB — URINALYSIS, ROUTINE W REFLEX MICROSCOPIC
Nitrite: NEGATIVE
Specific Gravity, Urine: 1.036 — ABNORMAL HIGH
Urobilinogen, UA: 1
pH: 6

## 2011-07-06 LAB — CBC
HCT: 48.1 — ABNORMAL HIGH
Platelets: 272
RDW: 13.6
WBC: 10.1

## 2011-07-06 LAB — DIFFERENTIAL
Basophils Absolute: 0.1
Eosinophils Relative: 1
Lymphocytes Relative: 32
Lymphs Abs: 3.2
Neutrophils Relative %: 57

## 2011-07-06 LAB — POCT I-STAT, CHEM 8
Calcium, Ion: 1.09 — ABNORMAL LOW
Creatinine, Ser: 0.8
Glucose, Bld: 99
Hemoglobin: 17.3 — ABNORMAL HIGH
TCO2: 23

## 2011-07-06 LAB — POCT PREGNANCY, URINE
Operator id: 22937
Preg Test, Ur: NEGATIVE

## 2011-07-06 LAB — URINE MICROSCOPIC-ADD ON

## 2011-07-06 LAB — LIPASE, BLOOD: Lipase: 13

## 2011-07-06 LAB — BASIC METABOLIC PANEL
BUN: 7
CO2: 23
Calcium: 8.9
Creatinine, Ser: 0.6
Glucose, Bld: 114 — ABNORMAL HIGH

## 2011-07-06 LAB — HEPATIC FUNCTION PANEL
ALT: 15
Albumin: 4.3
Indirect Bilirubin: 0.8
Total Protein: 7.6

## 2011-07-11 LAB — COMPREHENSIVE METABOLIC PANEL
ALT: 24
Calcium: 9.9
GFR calc Af Amer: >60
Glucose, Bld: 128 — ABNORMAL HIGH
Sodium: 141
Total Protein: 7.3

## 2011-07-11 LAB — CBC
Hemoglobin: 14.3
MCHC: 33
Platelets: 273
RDW: 15.5

## 2011-07-11 LAB — POCT CARDIAC MARKERS
Myoglobin, poc: 60.8
Troponin i, poc: <0.05

## 2011-07-11 LAB — DIFFERENTIAL
Eosinophils Absolute: 0.2
Lymphs Abs: 4.8 — ABNORMAL HIGH
Monocytes Relative: 8
Neutrophils Relative %: 36 — ABNORMAL LOW

## 2011-07-17 ENCOUNTER — Ambulatory Visit (HOSPITAL_COMMUNITY): Payer: Medicare Other | Attending: Internal Medicine | Admitting: Radiology

## 2011-07-17 DIAGNOSIS — R079 Chest pain, unspecified: Secondary | ICD-10-CM

## 2011-07-17 DIAGNOSIS — I2510 Atherosclerotic heart disease of native coronary artery without angina pectoris: Secondary | ICD-10-CM

## 2011-07-17 DIAGNOSIS — I214 Non-ST elevation (NSTEMI) myocardial infarction: Secondary | ICD-10-CM | POA: Insufficient documentation

## 2011-07-17 DIAGNOSIS — I200 Unstable angina: Secondary | ICD-10-CM

## 2011-07-17 MED ORDER — TECHNETIUM TC 99M TETROFOSMIN IV KIT
11.0000 | PACK | Freq: Once | INTRAVENOUS | Status: AC | PRN
  Administered 2011-07-17: 11 via INTRAVENOUS

## 2011-07-17 MED ORDER — TECHNETIUM TC 99M TETROFOSMIN IV KIT
33.0000 | PACK | Freq: Once | INTRAVENOUS | Status: AC | PRN
  Administered 2011-07-17: 33 via INTRAVENOUS

## 2011-07-17 MED ORDER — REGADENOSON 0.4 MG/5ML IV SOLN
0.4000 mg | Freq: Once | INTRAVENOUS | Status: AC
  Administered 2011-07-17: 0.4 mg via INTRAVENOUS

## 2011-07-17 NOTE — Progress Notes (Signed)
[facility] SITE 3 NUCLEAR MED [address] [place] [address] [phone]  Cardiology Nuclear Med Study  SANAH KRASKA is a 45 y.o. female [id number] January 19, 1966   Nuclear Med Background Indication for Stress Test:  Evaluation for Ischemia and 06/03/11 Post Hospital: NSTEMI History:  '09 STEMI>Stent-CFX; '10 MPS:No Ischemia, EF=52%; 8/12 NSTEMI>Stent-In Stent CFX; 06/20/11 Echo:EF=55-60%, mild MR Cardiac Risk Factors: Family History - CAD, Hypertension, Lipids and Smoker  Symptoms:  Chest Pain (last episode of chest discomfort was under the camera this morning, none now), DOE, Fatigue and Rapid HR   Nuclear Pre-Procedure Caffeine/Decaff Intake:  None NPO After: 8:30pm   Lungs:  Clear.  O2 SAT 99% on RA IV 0.9% NS with Angio Cath:  20g  IV Site: R Antecubital  IV Started by:  Stanton Kidney, EMT-P  Chest Size (in):  38 Cup Size: D  Height: 5\' 6"  (1.676 m)  Weight:  175 lb (79.379 kg)  BMI:  Body mass index is 28.25 kg/(m^2). Tech Comments:  Toprol not held, takes HS, per patient.    Nuclear Med Study 1 or 2 day study: 1 day  Stress Test Type:  Lexiscan  Reading MD: Charlton Haws, MD  Order Authorizing Provider:  Arvilla Meres, MD; Tereso Newcomer, PA-C  Resting Radionuclide: Technetium 36m Tetrofosmin  Resting Radionuclide Dose: 11.0 mCi   Stress Radionuclide:  Technetium 39m Tetrofosmin  Stress Radionuclide Dose: 33.0 mCi           Stress Protocol Rest HR: 54 Stress HR: 81  Rest BP: 107/80 Stress BP: 128/78  Exercise Time (min): n/a METS: n/a   Predicted Max HR: 174 bpm % Max HR: 46.55 bpm Rate Pressure Product: [id number]   Dose of Adenosine (mg):  n/a Dose of Lexiscan: 0.4 mg  Dose of Atropine (mg): n/a Dose of Dobutamine: n/a mcg/kg/min (at max HR)  Stress Test Technologist: Smiley Houseman, CMA-N  Nuclear Technologist:  Domenic Polite, CNMT     Rest Procedure:  Myocardial perfusion imaging was performed at rest 45 minutes following the  intravenous administration of Technetium 72m Tetrofosmin.  Rest ECG: Sinus bradycardia, otherwise normal.  Stress Procedure:  The patient received IV Lexiscan 0.4 mg over 15-seconds.  Technetium 63m Tetrofosmin injected at 30-seconds.  There were nonspecific T-wave changes and occasional PVC's with Lexiscan.  She did c/o chest pain in recovery.  Quantitative spect images were obtained after a 45 minute delay.  Stress ECG: No significant change from baseline ECG  QPS Raw Data Images:  Motion artifact on stress images Stress Images:  Normal homogeneous uptake in all areas of the myocardium. Rest Images:  Normal homogeneous uptake in all areas of the myocardium. Subtraction (SDS):  SDS 3 Transient Ischemic Dilatation (Normal <1.22):  1.09 Lung/Heart Ratio (Normal <0.45):  0.31  Quantitative Gated Spect Images QGS EDV:  106 ml QGS ESV:  48 ml QGS cine images:  NL LV Function; NL Wall Motion QGS EF: 54%  Impression Exercise Capacity:  Lexiscan with no exercise. BP Response:  Normal blood pressure response. Clinical Symptoms:  No chest pain. ECG Impression:  No significant ST segment change suggestive of ischemia. Comparison with Prior Nuclear Study: No images to compare  Overall Impression:  Low risk stress nuclear study.  Mild attenuation of anterior wall likely from motion or breast artifact    Charlton Haws

## 2011-07-20 NOTE — H&P (Signed)
[name], [name]             ACCOUNT NO.:  [id number]  MEDICAL RECORD NO.:  [id number]  LOCATION:  2924                         FACILITY:  [facility]  PHYSICIAN:  Rollene Rotunda, MD, FACCDATE OF BIRTH:  02-Feb-1966  DATE OF ADMISSION:  06/02/2011 DATE OF DISCHARGE:                             HISTORY & PHYSICAL   PRIMARY:  [name].  CARDIOLOGIST:  Bevelyn Buckles. Bensimhon, MD  REASON FOR PRESENTATION:  Evaluate the patient with back pain.  HISTORY OF PRESENT ILLNESS:  The patient is a pleasant 45 year old [place] American female with a history of coronary artery disease as described below.  She has not seen Dr. Gala Romney in a few years.  She presented with a cough.  This has been going on for 2-3 days.  It has been nonproductive for the most part.  She has also had some left upper back pain.  She is not sure, but thinks this might be similar to her previous angina.  It has been constant over the last couple of days.  It has been 7/10 in intensity.  There is some discomfort with movement or coughing.  It is like a gripping discomfort.  She is not describing associated diaphoresis.  She did have some vomiting earlier today.  She has had no shortness of breath, PND, or orthopnea, though she tried to sleep propped up chest for comfort.  She has had mild diarrhea.  She has had no constipation.  She came to the emergency room with this discomfort where an EKG demonstrated no acute ST-segment changes. However, her first point-of-care marker demonstrated troponin that was elevated at 0.89.  The patient is limited in her activities because she has cerebral palsy. She tries to walk a little bit.  She says she has not been able to bring on symptoms with this activity.  She has noticed no presyncope or syncope.  PAST MEDICAL HISTORY: 1. History of depression. 2. Cerebral palsy. 3. Hypertension. 4. Gastroesophageal reflux disease. 5. Coronary artery disease  (inferior myocardial infarction with bare-     metal stenting to a dominant circumflex occlusion in May 2009.     Stress perfusion study in December 2010 without evidence of     ischemia). 6. History of small bowel obstruction with previous pelvic mass.  PAST SURGICAL HISTORY:  Hysterectomy, C-section, bilateral heel surgeries, tubal ligation, and oophorectomy.  ALLERGIES:  PENICILLIN.  MEDICATIONS: 1. Aspirin 325 mg daily. 2. Baclofen. 3. Zyrtec. 4. Celexa 20 mg daily. 5. Plavix 75 mg daily (she stopped this drug as she could not afford     it). 6. Pepcid 40 mg b.i.d. 7. Flonase. 8. Metoprolol 25 mg daily. 9. Pravachol 40 mg daily. 10.Desyrel 100 mg daily.  SOCIAL HISTORY:  The patient has 2 children.  Her daughter is in the emergency room with her.  She has been smoking at least a pack per day for 25 years and still does.  FAMILY HISTORY:  Contributory for her father dying of myocardial infarction at age 34 and her mother dying of respiratory failure at age 16.  REVIEW OF SYSTEMS:  As stated in the HPI and otherwise negative for all other systems.  PHYSICAL EXAMINATION:  GENERAL:  The patient is very pleasant and in no distress. VITAL SIGNS:  Blood pressure 124/95, heart rate 54 and regular, afebrile, and respiratory rate 18. HEENT:  Eyelids are unremarkable.  Pupils are equal, round, and reactive to light.  Fundi not visualized.  Oral mucosa unremarkable. NECK:  No jugular venous distention at 45 degrees.  Carotid upstroke brisk and symmetrical.  No bruits, no thyromegaly. LYMPHATICS:  No cervical, axillary, or inguinal adenopathy. LUNGS:  Clear to auscultation bilaterally. BACK:  No costovertebral angle tenderness. CHEST:  Unremarkable. HEART:  PMI not displaced or sustained.  S1 and S2 within normal limits. No S3, no S4, no clicks, no rubs, no murmurs. ABDOMEN:  Flat, positive bowel sounds normal in frequency and pitch.  No bruits, no rebound, no guarding, no  midline pulsatile mass, no hepatomegaly, no splenomegaly. SKIN:  No rashes, no nodules. EXTREMITIES:  2+ pulses, bilateral muscle wasting below the knees. NEURO:  Cranial nerves II-XII grossly intact.  Motor grossly intact except for weakness in the lower extremities.  EKG:  Normal sinus rhythm, rate 52, no acute ST-T wave changes.  Sodium 139, potassium 4.4, BUN 6, creatinine 0.63.  Troponin 0.89.  WBC 10.7, hemoglobin 14.3, and platelets 231.  Chest x-ray:  COPD.  ASSESSMENT/PLAN: 1. Chest:  The patient's back pain is somewhat atypical, but may be     reminiscent of her previous angina.  She has ongoing risk factors.     She does have an elevated troponin.  At this point, I am going to     admit her with aspirin, continue her previous beta-blockers.  I     will treat her with heparin and nitrates.  She should have cardiac     catheterization given this presentation.  I will restart Plavix.     She will need extensive risk reduction. 2. Tobacco:  We will get a stop-smoking consult and continue to     educate. 3. Risk reduction:  I will check a lipid profile and TSH. 4. Hypertension.  She will have this treated in the context of     treating her chest pain.     Rollene Rotunda, MD, [facility]     JH/MEDQ  D:  06/03/2011  T:  06/03/2011  Job:  [id number]  cc:   Bevelyn Buckles. Bensimhon, MD  Electronically Signed by Rollene Rotunda MD [facility] on 07/20/2011 01:31:43 PM

## 2011-07-25 ENCOUNTER — Telehealth: Payer: Self-pay | Admitting: Licensed Clinical Social Worker

## 2011-07-25 NOTE — Telephone Encounter (Signed)
Smoking Cessation Counseling.  Extensive phone consult w/ patient.  Patient w/ multiple medical issues including heart disease, cerebral palsy and obesity.   Patient has been smoking for 20 plus years and is down to about 1/4 pack.  She cannot afford the patches at this time.  She and her 47 yo daughter are smoking inside the home.  She has no quit date set at this time.   Tips shared w/ patient to find healthy substitutes for smoking including water, carrot/celery stix, straws, toothpicks, regular gum.  Strongly encouraged patient to make home smoke free and begin smoking outside.   Patient interested in [facility] group program which starts on 10/23 and given phone number to register.  Encouraged to connect w 1-800 QUITNOW line. Sending tip sheet, business card and all info by mail so she can f/u.

## 2011-07-28 ENCOUNTER — Telehealth: Payer: Self-pay | Admitting: Internal Medicine

## 2011-07-28 MED ORDER — METOPROLOL SUCCINATE ER 25 MG PO TB24: 12.5000 mg | ORAL_TABLET | Freq: Every day | ORAL | Status: DC

## 2011-07-28 NOTE — Telephone Encounter (Signed)
Pt needs a refill sent to [facility] on [address] for Metoprolol 25mg  half a pill qd

## 2011-07-30 ENCOUNTER — Telehealth: Payer: Self-pay | Admitting: Physician Assistant

## 2011-07-30 NOTE — Telephone Encounter (Signed)
Patient's upcoming appointment has to be rescheduled.  She asked the scheduler if she could just cancel as her recent stress test was ok.  Please ask patient if she is doing better with the Protonix.  If so she can follow up with her PCP for GERD.  If she is having any problems, please reschedule appt with me.  Otherwise, I would reschedule her appointment for December (that will be 3 months out from her MI).  She was seeing Dr. Gala Romney.  I would schedule her appt with first available cardiologist. Tereso Newcomer, PA-C

## 2011-07-31 NOTE — Telephone Encounter (Signed)
Pt is coming in 08/15/11 to see you with DB in the office @ the same time. [name]

## 2011-07-31 NOTE — Telephone Encounter (Signed)
Pt is coming in 09/29/11 to see Dr. Jens Som and asked if she could have her lab done the same day to save a co-pay and also that she rides the [employer] bus. I told her I thought that would be ok, if not I would let he know.  Danielle Rankin

## 2011-07-31 NOTE — Telephone Encounter (Signed)
She has some problems with finances.  So ask if she is doing ok as above and if so and it is better for her to come in in December, that is ok.  If 11/6 is good with her, than keep that appt. Tereso Newcomer, PA-C

## 2011-07-31 NOTE — Telephone Encounter (Signed)
That is fine. Tereso Newcomer, PA-C

## 2011-08-09 ENCOUNTER — Ambulatory Visit: Payer: Medicare Other | Admitting: Physician Assistant

## 2011-08-09 ENCOUNTER — Other Ambulatory Visit: Payer: Medicare Other | Admitting: *Deleted

## 2011-08-15 ENCOUNTER — Ambulatory Visit: Payer: Medicare Other | Admitting: Physician Assistant

## 2011-08-15 ENCOUNTER — Other Ambulatory Visit: Payer: Medicare Other | Admitting: *Deleted

## 2011-09-29 ENCOUNTER — Ambulatory Visit: Payer: Medicare Other | Admitting: Physician Assistant

## 2011-09-29 ENCOUNTER — Ambulatory Visit: Payer: Medicare Other | Admitting: Cardiology

## 2011-09-29 ENCOUNTER — Other Ambulatory Visit: Payer: Medicare Other | Admitting: *Deleted

## 2011-10-05 ENCOUNTER — Telehealth: Payer: Self-pay | Admitting: Internal Medicine

## 2011-10-05 ENCOUNTER — Emergency Department (HOSPITAL_COMMUNITY): Payer: Medicare Other

## 2011-10-05 ENCOUNTER — Observation Stay (HOSPITAL_COMMUNITY)
Admission: EM | Admit: 2011-10-05 | Discharge: 2011-10-06 | Disposition: A | Discharge status: Home / Self Care | Payer: Medicare Other | Attending: Internal Medicine | Admitting: Internal Medicine

## 2011-10-05 ENCOUNTER — Encounter (HOSPITAL_COMMUNITY): Payer: Self-pay | Admitting: Emergency Medicine

## 2011-10-05 ENCOUNTER — Other Ambulatory Visit: Payer: Self-pay

## 2011-10-05 DIAGNOSIS — R0989 Other specified symptoms and signs involving the circulatory and respiratory systems: Secondary | ICD-10-CM | POA: Insufficient documentation

## 2011-10-05 DIAGNOSIS — R9431 Abnormal electrocardiogram [ECG] [EKG]: Secondary | ICD-10-CM | POA: Insufficient documentation

## 2011-10-05 DIAGNOSIS — R1013 Epigastric pain: Secondary | ICD-10-CM | POA: Insufficient documentation

## 2011-10-05 DIAGNOSIS — E785 Hyperlipidemia, unspecified: Secondary | ICD-10-CM | POA: Insufficient documentation

## 2011-10-05 DIAGNOSIS — K219 Gastro-esophageal reflux disease without esophagitis: Secondary | ICD-10-CM | POA: Insufficient documentation

## 2011-10-05 DIAGNOSIS — I10 Essential (primary) hypertension: Secondary | ICD-10-CM | POA: Insufficient documentation

## 2011-10-05 DIAGNOSIS — F17200 Nicotine dependence, unspecified, uncomplicated: Secondary | ICD-10-CM | POA: Insufficient documentation

## 2011-10-05 DIAGNOSIS — G809 Cerebral palsy, unspecified: Secondary | ICD-10-CM

## 2011-10-05 DIAGNOSIS — R0609 Other forms of dyspnea: Secondary | ICD-10-CM | POA: Insufficient documentation

## 2011-10-05 DIAGNOSIS — Z9861 Coronary angioplasty status: Secondary | ICD-10-CM | POA: Insufficient documentation

## 2011-10-05 DIAGNOSIS — Z720 Tobacco use: Secondary | ICD-10-CM | POA: Insufficient documentation

## 2011-10-05 DIAGNOSIS — I252 Old myocardial infarction: Secondary | ICD-10-CM | POA: Insufficient documentation

## 2011-10-05 DIAGNOSIS — I2510 Atherosclerotic heart disease of native coronary artery without angina pectoris: Secondary | ICD-10-CM | POA: Insufficient documentation

## 2011-10-05 DIAGNOSIS — R079 Chest pain, unspecified: Principal | ICD-10-CM | POA: Insufficient documentation

## 2011-10-05 LAB — CBC
HCT: 42.2 % (ref 36.0–46.0)
Hemoglobin: 14.7 g/dL (ref 12.0–15.0)
RBC: 4.7 MIL/uL (ref 3.87–5.11)
WBC: 8.2 10*3/uL (ref 4.0–10.5)

## 2011-10-05 LAB — BASIC METABOLIC PANEL
BUN: 7 mg/dL (ref 6–23)
CO2: 23 mEq/L (ref 19–32)
Chloride: 102 mEq/L (ref 96–112)
Glucose, Bld: 97 mg/dL (ref 70–99)
Potassium: 4 mEq/L (ref 3.5–5.1)
Sodium: 136 mEq/L (ref 135–145)

## 2011-10-05 LAB — POCT I-STAT TROPONIN I: Troponin i, poc: 0 ng/mL (ref 0.00–0.08)

## 2011-10-05 MED ORDER — NITROGLYCERIN 2 % TD OINT
1.0000 [in_us] | TOPICAL_OINTMENT | Freq: Once | TRANSDERMAL | Status: AC
  Administered 2011-10-05: 1 [in_us] via TOPICAL
  Filled 2011-10-05: qty 1

## 2011-10-05 MED ORDER — ASPIRIN 325 MG PO TABS: 325.0000 mg | ORAL_TABLET | ORAL | Status: DC

## 2011-10-05 NOTE — Telephone Encounter (Signed)
New Problem:    Patient, since yesterday, has bad chest pain that feels like a real bad cramp.  PAtient has been taking her nitro pills but the pains keep coming back. She has had two stents placed. Synetta Fail is speaking with her.

## 2011-10-05 NOTE — ED Provider Notes (Signed)
History     CSN: [id number]  Arrival date & time 10/05/11  1801   First MD Initiated Contact with Patient 10/05/11 2046      Chief Complaint  Patient presents with  . Chest Pain    (Consider location/radiation/quality/duration/timing/severity/associated sxs/prior treatment) The history is provided by the patient.    patient is a 45 yo  female with history of CAD (previous stent and recent in-stent thrombosis) who presents with chest pain. This started yesterday. It is located in her low central chest or epigastric region. It does not migrate but she occasionally has discomfort in her left shoulder with this pain. She does note mild dyspnea with this chest pain but that is short lived. This pain has been present when she was resting. She has not noted any recent exertional chest discomfort. She has improvement with the nitroglycerin. She states this chest discomfort is in similar location is prior ACS chest pain, however it is more of a cramping and burning this time. She has been compliant with her medications including Plavix. No recent lower extremity swelling or calf pain. Overall severity is described as moderate. At time of evaluation in the ED, patient was without chest pain or dyspnea. She has not had any significant abdominal pain. The symptoms do not change when she eats. No fevers.   Past Medical History  Diagnosis Date  . Coronary artery disease 02/2008    a. s/p INF STEMI 5/09 tx with BMS to CFX;  b. Lexiscan Myoview 12/10 -  low risk, no ischemia.;   c. s/p NSTEMI 8/12: cath with LM 10%, LAD lum. irregs., oOM1 40%, mOM2 40%, mCFX stent occluded, RCA 50%, EF 45-50%; PCI - s/p DES to mCFX   . Bipolar disorder      Followed by mental health, who prescribe her bipolar meds  . Tobacco abuse   . Cerebral palsy      with history of neuropathic pain  and spasticity,  requiring long-term pain medications  . Hypertension   . GERD (gastroesophageal reflux disease)   . Vitamin D  deficiency 11/2009     patient treated with high-dose vitamin D weekly x3 months,  levels were rechecked and it was determined that the patient continued to require high dose repletion of vitamin D.  . History of pelvic mass     Found on CT abd/ pelvis (04/2008) - 13.3 cm cystic pelvis mass, possibly left ovarian origin. To be followed at [facility].   Marland Kitchen HLD (hyperlipidemia)     Past Surgical History  Procedure Date  . Total abdominal hysterectomy 03/2003     secondary to symptomatic fibroids.  . Cesarean section     x2  . Tubal ligation   .  elective abortion     x2  . Other surgical history      heel surgery, bilateral    Family History  Problem Relation Age of Onset  . Lung disease Mother 86    Died of respiratory failure  . Heart attack Father 79    History  Substance Use Topics  . Smoking status: Current Everyday Smoker -- 0.3 packs/day for 25 years    Types: Cigarettes  . Smokeless tobacco: Not on file   Comment: Since August 24th  . Alcohol Use: No    OB History    Grav Para Term Preterm Abortions TAB SAB Ect Mult Living                  Review of  Systems  Constitutional: Negative for fever and chills.  HENT: Negative for facial swelling.   Eyes: Negative for visual disturbance.  Respiratory: Negative for cough, chest tightness, shortness of breath and wheezing.   Gastrointestinal: Negative for nausea, vomiting, abdominal pain and diarrhea.  Genitourinary: Negative for difficulty urinating.  Skin: Negative for rash.  Neurological: Negative for weakness and numbness.  Psychiatric/Behavioral: Negative for behavioral problems and confusion.  All other systems reviewed and are negative.    Allergies  Penicillins  Home Medications   Current Outpatient Rx  Name Route Sig Dispense Refill  . ASPIRIN 81 MG PO TABS Oral Take 81 mg by mouth daily.      Marland Kitchen BACLOFEN 10 MG PO TABS Oral Take 1 tablet (10 mg total) by mouth every 8 (eight) hours as needed. For  spasticity 90 each 5  . CETIRIZINE HCL 10 MG PO CAPS Oral Take 1 capsule (10 mg total) by mouth daily. 30 capsule 1  . CITALOPRAM HYDROBROMIDE 20 MG PO TABS Oral Take 20 mg by mouth daily.      Marland Kitchen CLOPIDOGREL BISULFATE 75 MG PO TABS Oral Take 1 tablet (75 mg total) by mouth daily. 30 tablet 6  . METOPROLOL SUCCINATE ER 25 MG PO TB24 Oral Take 0.5 tablets (12.5 mg total) by mouth daily. 30 tablet 6  . NITROGLYCERIN 0.4 MG SL SUBL Sublingual Place 0.4 mg under the tongue every 5 (five) minutes x 3 doses as needed.      Marland Kitchen PANTOPRAZOLE SODIUM 40 MG PO TBEC Oral Take 1 tablet (40 mg total) by mouth daily. 30 tablet 6  . PRAVASTATIN SODIUM 40 MG PO TABS Oral Take 1 tablet (40 mg total) by mouth daily. 30 tablet 6  . TRAMADOL HCL 50 MG PO TABS Oral Take 1 tablet (50 mg total) by mouth every 8 (eight) hours as needed. For pain 60 tablet 2  . TRAZODONE HCL 100 MG PO TABS Oral Take 100 mg by mouth at bedtime.      Marland Kitchen ZOLPIDEM TARTRATE 10 MG PO TABS Oral Take 10 mg by mouth at bedtime as needed.        BP 142/91  Pulse 57  Temp(Src) 97.6 F (36.4 C) (Oral)  Resp 16  Wt 178 lb (80.74 kg)  SpO2 99%  LMP 12/22/2007  Physical Exam  Nursing note and vitals reviewed. Constitutional: She is oriented to person, place, and time. She appears well-developed and well-nourished. No distress.  HENT:  Head: Normocephalic.  Nose: Nose normal.  Eyes: EOM are normal.  Neck: Normal range of motion. Neck supple.  Cardiovascular: Normal rate, regular rhythm and intact distal pulses.   No murmur heard. Pulmonary/Chest: Effort normal and breath sounds normal. No respiratory distress. She has no wheezes. She exhibits no tenderness.  Abdominal: Soft. She exhibits no distension. There is no tenderness.       No abdominal tenderness palpation. No Murphy sign.  Musculoskeletal: Normal range of motion. She exhibits no edema and no tenderness.       No calf TTP  Neurological: She is alert and oriented to person, place,  and time.       Normal strength  Skin: Skin is warm and dry. No rash noted. She is not diaphoretic.  Psychiatric: She has a normal mood and affect. Her behavior is normal. Thought content normal.    ED Course  Procedures (including critical care time)   Date: 10/05/2011  Rate: 59  Rhythm: sinus bradycardia with PAC  QRS Axis:  normal  Intervals: normal  ST/T Wave abnormalities: nonspecific T wave changes  Conduction Disutrbances:none  Narrative Interpretation: no acute ischemia  Old EKG Reviewed: unchanged compared to 06/03/11     Labs Reviewed  CBC  BASIC METABOLIC PANEL  PRO B NATRIURETIC PEPTIDE  POCT I-STAT TROPONIN I  POCT CARDIAC MARKERS  I-STAT TROPONIN I   Dg Chest 2 View  10/05/2011  *RADIOLOGY REPORT*  Clinical Data: Chest pain  CHEST - 2 VIEW  Comparison: 06/02/2011  Findings: Heart size is normal.  No pleural effusion or pulmonary edema.  No airspace consolidation identified.  Visualized osseous structures are unremarkable.  IMPRESSION:  1.  No acute cardiopulmonary abnormalities.  Original Report Authenticated By: Rosealee Albee, M.D.   Dg Shoulder Left  10/05/2011  *RADIOLOGY REPORT*  Clinical Data: Shoulder pain  LEFT SHOULDER - 2+ VIEW  Comparison: None  Findings: There is no evidence of fracture or dislocation.  Mild degenerative changes involve the acromioclavicular joint.  Soft tissues are unremarkable.  IMPRESSION: 1.  No acute findings. 2.  Mild AC joint osteoarthritis.  Original Report Authenticated By: Rosealee Albee, M.D.     1. Chest pain       MDM   Patient with history of CAD who is here with chest pain. Patient has atypical chest pain, however her prior ACS pain was also atypical. She had already received aspirin. EKG was without acute ischemia. Troponins negative. With history of recent in-stent issues, cardiology consulted. The admitted patient for MI rule out. She remained asymptomatic in the ED with nitro paste. She denied any lower  extremity discomfort or other features that might suggest DVT or PE. She also did not have abdominal pain or sore features that would suggest primary abdominal etiology.        Milus Glazier 10/06/11 0245

## 2011-10-05 NOTE — ED Notes (Signed)
Pt has had chest pain for 2 days.  Took 81mg  aspirin at home   ems gave 243mg  aditional.  Saline lock 22 gauge in rt hand.  EMS ekg normal sinus rhythum no st changes.  Pt denies n/v and dyaphoresis.

## 2011-10-06 ENCOUNTER — Other Ambulatory Visit: Payer: Self-pay

## 2011-10-06 ENCOUNTER — Encounter (HOSPITAL_COMMUNITY): Payer: Self-pay | Admitting: Physician Assistant

## 2011-10-06 ENCOUNTER — Encounter (HOSPITAL_COMMUNITY)
Admission: EM | Disposition: A | Discharge status: Home / Self Care | Payer: Self-pay | Source: Home / Self Care | Attending: Emergency Medicine

## 2011-10-06 DIAGNOSIS — I2510 Atherosclerotic heart disease of native coronary artery without angina pectoris: Secondary | ICD-10-CM

## 2011-10-06 HISTORY — PX: LEFT HEART CATHETERIZATION WITH CORONARY ANGIOGRAM: SHX5451

## 2011-10-06 LAB — COMPREHENSIVE METABOLIC PANEL
ALT: 20 U/L (ref 0–35)
AST: 19 U/L (ref 0–37)
Alkaline Phosphatase: 78 U/L (ref 39–117)
CO2: 24 mEq/L (ref 19–32)
Calcium: 9.7 mg/dL (ref 8.4–10.5)
Chloride: 104 mEq/L (ref 96–112)
GFR calc non Af Amer: >90 mL/min (ref >90)
Glucose, Bld: 123 mg/dL — ABNORMAL HIGH (ref 70–99)
Potassium: 3.6 mEq/L (ref 3.5–5.1)
Sodium: 138 mEq/L (ref 135–145)

## 2011-10-06 LAB — LIPID PANEL
HDL: 42 mg/dL (ref >39)
LDL Cholesterol: 145 mg/dL — ABNORMAL HIGH (ref 0–99)
Total CHOL/HDL Ratio: 5 RATIO

## 2011-10-06 LAB — CARDIAC PANEL(CRET KIN+CKTOT+MB+TROPI)
CK, MB: 3.5 ng/mL (ref 0.3–4.0)
Relative Index: 1.6 (ref 0.0–2.5)
Total CK: 222 U/L — ABNORMAL HIGH (ref 7–177)
Total CK: 270 U/L — ABNORMAL HIGH (ref 7–177)

## 2011-10-06 LAB — CBC
HCT: 38.8 % (ref 36.0–46.0)
MCH: 30.9 pg (ref 26.0–34.0)
MCV: 90 fL (ref 78.0–100.0)
RBC: 4.31 MIL/uL (ref 3.87–5.11)
RDW: 13.8 % (ref 11.5–15.5)
WBC: 7.4 10*3/uL (ref 4.0–10.5)

## 2011-10-06 LAB — LIPASE, BLOOD: Lipase: 39 U/L (ref 11–59)

## 2011-10-06 LAB — AMYLASE: Amylase: 90 U/L (ref 0–105)

## 2011-10-06 SURGERY — LEFT HEART CATHETERIZATION WITH CORONARY ANGIOGRAM
Anesthesia: LOCAL

## 2011-10-06 MED ORDER — ASPIRIN 81 MG PO TABS: 81.0000 mg | ORAL_TABLET | Freq: Every day | ORAL | Status: DC

## 2011-10-06 MED ORDER — ACETAMINOPHEN 325 MG PO TABS: 650.0000 mg | ORAL_TABLET | ORAL | Status: DC | PRN

## 2011-10-06 MED ORDER — SODIUM CHLORIDE 0.9 % IV SOLN: INTRAVENOUS | Status: DC

## 2011-10-06 MED ORDER — MIDAZOLAM HCL 2 MG/2ML IJ SOLN
INTRAMUSCULAR | Status: AC
  Filled 2011-10-06: qty 2

## 2011-10-06 MED ORDER — VERAPAMIL HCL 2.5 MG/ML IV SOLN
INTRAVENOUS | Status: AC
  Filled 2011-10-06: qty 2

## 2011-10-06 MED ORDER — PANTOPRAZOLE SODIUM 40 MG PO TBEC
40.0000 mg | DELAYED_RELEASE_TABLET | Freq: Every day | ORAL | Status: DC
  Administered 2011-10-06: 40 mg via ORAL

## 2011-10-06 MED ORDER — NITROGLYCERIN 0.4 MG SL SUBL: 0.4000 mg | SUBLINGUAL_TABLET | SUBLINGUAL | Status: DC | PRN

## 2011-10-06 MED ORDER — SODIUM CHLORIDE 0.9 % IV SOLN: 250.0000 mL | INTRAVENOUS | Status: DC | PRN

## 2011-10-06 MED ORDER — SODIUM CHLORIDE 0.9 % IV SOLN
1.0000 mL/kg/h | INTRAVENOUS | Status: DC
  Administered 2011-10-06: 1 mL/kg/h via INTRAVENOUS

## 2011-10-06 MED ORDER — ONDANSETRON HCL 4 MG/2ML IJ SOLN: 4.0000 mg | Freq: Four times a day (QID) | INTRAMUSCULAR | Status: DC | PRN

## 2011-10-06 MED ORDER — PNEUMOCOCCAL VAC POLYVALENT 25 MCG/0.5ML IJ INJ: 0.5000 mL | INJECTION | INTRAMUSCULAR | Status: DC

## 2011-10-06 MED ORDER — CITALOPRAM HYDROBROMIDE 20 MG PO TABS
20.0000 mg | ORAL_TABLET | Freq: Every day | ORAL | Status: DC
  Administered 2011-10-06: 20 mg via ORAL
  Filled 2011-10-06: qty 1

## 2011-10-06 MED ORDER — ASPIRIN 81 MG PO CHEW: 81.0000 mg | CHEWABLE_TABLET | Freq: Every day | ORAL | Status: DC

## 2011-10-06 MED ORDER — DIAZEPAM 5 MG PO TABS
5.0000 mg | ORAL_TABLET | ORAL | Status: AC
  Administered 2011-10-06: 5 mg via ORAL
  Filled 2011-10-06: qty 1

## 2011-10-06 MED ORDER — HEPARIN SODIUM (PORCINE) 5000 UNIT/ML IJ SOLN: 5000.0000 [IU] | Freq: Three times a day (TID) | INTRAMUSCULAR | Status: DC

## 2011-10-06 MED ORDER — FENTANYL CITRATE 0.05 MG/ML IJ SOLN
INTRAMUSCULAR | Status: AC
  Filled 2011-10-06: qty 2

## 2011-10-06 MED ORDER — SIMVASTATIN 20 MG PO TABS
20.0000 mg | ORAL_TABLET | Freq: Every day | ORAL | Status: DC
  Filled 2011-10-06: qty 1

## 2011-10-06 MED ORDER — SODIUM CHLORIDE 0.9 % IJ SOLN: 3.0000 mL | INTRAMUSCULAR | Status: DC | PRN

## 2011-10-06 MED ORDER — NITROGLYCERIN 0.2 MG/ML ON CALL CATH LAB
INTRAVENOUS | Status: AC
  Filled 2011-10-06: qty 1

## 2011-10-06 MED ORDER — ASPIRIN EC 81 MG PO TBEC
81.0000 mg | DELAYED_RELEASE_TABLET | Freq: Every day | ORAL | Status: DC
  Filled 2011-10-06: qty 1

## 2011-10-06 MED ORDER — LORATADINE 10 MG PO TABS
10.0000 mg | ORAL_TABLET | Freq: Every day | ORAL | Status: DC
  Filled 2011-10-06: qty 1

## 2011-10-06 MED ORDER — ENOXAPARIN SODIUM 40 MG/0.4ML ~~LOC~~ SOLN
40.0000 mg | SUBCUTANEOUS | Status: DC
  Administered 2011-10-06: 40 mg via SUBCUTANEOUS
  Filled 2011-10-06: qty 0.4

## 2011-10-06 MED ORDER — CLOPIDOGREL BISULFATE 75 MG PO TABS
75.0000 mg | ORAL_TABLET | Freq: Every day | ORAL | Status: DC
  Administered 2011-10-06: 75 mg via ORAL
  Filled 2011-10-06: qty 1

## 2011-10-06 MED ORDER — HEPARIN (PORCINE) IN NACL 2-0.9 UNIT/ML-% IJ SOLN
INTRAMUSCULAR | Status: AC
  Filled 2011-10-06: qty 2000

## 2011-10-06 MED ORDER — TRAZODONE HCL 100 MG PO TABS
100.0000 mg | ORAL_TABLET | Freq: Every day | ORAL | Status: DC
  Filled 2011-10-06: qty 1

## 2011-10-06 MED ORDER — LIDOCAINE HCL (PF) 1 % IJ SOLN
INTRAMUSCULAR | Status: AC
  Filled 2011-10-06: qty 30

## 2011-10-06 MED ORDER — BACLOFEN 10 MG PO TABS
10.0000 mg | ORAL_TABLET | Freq: Three times a day (TID) | ORAL | Status: DC | PRN
  Filled 2011-10-06: qty 1

## 2011-10-06 MED ORDER — METOPROLOL SUCCINATE 12.5 MG HALF TABLET
12.5000 mg | ORAL_TABLET | Freq: Every day | ORAL | Status: DC
  Administered 2011-10-06: 12.5 mg via ORAL
  Filled 2011-10-06: qty 1

## 2011-10-06 MED ORDER — ASPIRIN 81 MG PO CHEW
324.0000 mg | CHEWABLE_TABLET | ORAL | Status: AC
  Administered 2011-10-06: 324 mg via ORAL
  Filled 2011-10-06: qty 4

## 2011-10-06 MED ORDER — HEPARIN SODIUM (PORCINE) 1000 UNIT/ML IJ SOLN
INTRAMUSCULAR | Status: AC
  Filled 2011-10-06: qty 1

## 2011-10-06 MED ORDER — ZOLPIDEM TARTRATE 5 MG PO TABS: 10.0000 mg | ORAL_TABLET | Freq: Every evening | ORAL | Status: DC | PRN

## 2011-10-06 MED ORDER — SODIUM CHLORIDE 0.9 % IJ SOLN: 3.0000 mL | Freq: Two times a day (BID) | INTRAMUSCULAR | Status: DC

## 2011-10-06 NOTE — H&P (View-Only) (Signed)
  Patient admitted this am with epigastric pain - seems atypical for angina but ECG with new inferior lateral TWI. CE negative. Has had recent ISR of LCX stent in 8/12 with normal Myoview in 10/12. Although sx atypical, given ECG she needs further evaluation. Discussed stress test vs cath. We have decided on re-look cath. If OK can d/c later today with GI f/u. Will check amylase and lipase as epigastrum mildly tender.  

## 2011-10-06 NOTE — Interval H&P Note (Signed)
History and Physical Interval Note:  10/06/2011 12:57 PM  [name]  has presented today for surgery, with the diagnosis of chest pain  The various methods of treatment have been discussed with the patient and family. After consideration of risks, benefits and other options for treatment, the patient has consented to  Procedure(s): LEFT HEART CATHETERIZATION WITH CORONARY ANGIOGRAM as a surgical intervention .  The patients' history has been reviewed, patient examined, no change in status, stable for surgery.  I have reviewed the patients' chart and labs.  Questions were answered to the patient's satisfaction.     Samir Ishaq [employer]

## 2011-10-06 NOTE — Progress Notes (Signed)
  Patient admitted this am with epigastric pain - seems atypical for angina but ECG with new inferior lateral TWI. CE negative. Has had recent ISR of LCX stent in 8/12 with normal Myoview in 10/12. Although sx atypical, given ECG she needs further evaluation. Discussed stress test vs cath. We have decided on re-look cath. If OK can d/c later today with GI f/u. Will check amylase and lipase as epigastrum mildly tender.

## 2011-10-06 NOTE — Discharge Summary (Signed)
Discharge Summary   Patient ID: [name],  MRN: [id number], DOB/AGE: 1966-01-07 45 y.o.  Admit date: 10/05/2011 Discharge date: 10/06/2011  Discharge Diagnoses Principal Problem:  *Chest pain Active Problems:  HYPERLIPIDEMIA  HYPERTENSION, UNSPECIFIED  C A D  GERD  Tobacco abuse   Allergies Allergies  Allergen Reactions  . Penicillins Hives    Procedures  Left Heart Catheterization with Coronary Angiogram   Procedural Findings:  Hemodynamics:  AO 115/68  LV 123/11  Coronary angiography:  Coronary dominance: left  Left mainstem: No significant disease.  Left anterior descending (LAD): 25% proximal, 25% mid LAD stenosis.  Left circumflex (LCx): Large vessel supplying left-sided PDA. Moderate OM1 with 50% ostial stenosis. Moderate OM2 with 40% ostial stenosis and 30% proximal stenosis. Patent mid LCx stent. Small OM3 with 80% ostial stenosis. Left-sided PDA patent.  Right coronary artery (RCA): Small nondominant vessel with diffuse up to 50% stenosis in the mid to distal vessel.  Left ventriculography: Left ventricular systolic function is normal, LVEF is estimated at 55%, there is basal inferior akinesis. There is no significant mitral regurgitation  Final Conclusions: Patent LCx stent. Diffuse disease up to 50% in small nondominant RCA. 80% ostial stenosis in small OM3.  Recommendations: No major disease, suspect chest pain was noncardiac, ? GI-related. OK for discharge later today.   History of Present Illness  [name] is a 45 yo female with PMHx significant for CAD (s/p INF STEMI 5/09 tx with BMS to CFX; Lexiscan Myoview 12/10 - low risk, no ischemia.; s/p NSTEMI 8/12: cath with LM 10%, LAD lum. irregs., oOM1 40%, mOM2 40%, mCFX stent occluded, RCA 50%, EF 45-50%; PCI - s/p DES to mCFX), HTN, HL, tobacco abuse and GERD who was admitted to [facility] hospital with epigastric pain.  Hospital Course   She reported a 3 day history of intermittent epigastric pain  related to reflux pain. ECG ordered revealed lateral TWIs. Although she had recent normal normal Myoview 10/12, decision was made to undergo repeat catheterization given her CAD history and cardiac risk factors.   She was informed, consented, agreed and underwent left heart catheterization revealing the above details including patent LCx stent, diffuse disease (up to 50%) in RCA, and 80% stenosis in small OM3. There was no major disease found and cardiac source of chest pain was rule out.   Of note, she was found to be mildly tender in epigastric region. Amylase/lipase WNL.   She is currently stable, at baseline and will be discharged home today. Recommendation was made to follow-up with GI as source of chest pain.   Discharge Vitals:  Blood pressure 142/91, pulse 56, temperature 97.8 F (36.6 C), temperature source Oral, resp. rate 18, height 5\' 5"  (1.651 m), weight 80.287 kg (177 lb), last menstrual period 12/22/2007, SpO2 98.00%.   Labs: Recent Labs  Basename 10/06/11 0515 10/05/11 1934   WBC 7.4 8.2   HGB 13.3 14.7   HCT 38.8 42.2   MCV 90.0 89.8   PLT 221 240    Lab 10/06/11 0923 10/06/11 0515 10/05/11 1934  NA -- 138 136  K -- 3.6 4.0  CL -- 104 102  CO2 -- 24 23  BUN -- 10 7  CREATININE -- 0.71 0.66  CALCIUM -- 9.7 10.0  PROT -- 7.1 --  BILITOT -- 0.2* --  ALKPHOS -- 78 --  ALT -- 20 --  AST -- 19 --  AMYLASE 90 -- --  LIPASE 39 -- --  GLUCOSE -- 123*  97    Recent Labs  Basename 10/06/11 1158 10/06/11 0515   CKTOTAL 270* 222*   CKMB 4.4* 3.5   CKMBINDEX -- --   TROPONINI <0.30 <0.30    Recent Labs  Basename 10/06/11 0515   CHOL 211*   HDL 42   LDLCALC 145*   TRIG 119   CHOLHDL 5.0   LDLDIRECT --    Disposition: stable, at baseline  Discharge Orders    Future Appointments: Provider: Department: Dept Phone: Center:   11/01/2011 9:30 AM Lewayne Bunting, MD [facility] [phone] [facility]   11/01/2011 9:45 AM Lorenda Cahill [facility] [phone] [facility]     Follow-up Information    Follow up with [facility] in 4 weeks. (Appointment already made with Dr. Jens Som, to have labs drawn that day as well. )    Contact information:   [address] [place] [address]          Discharge Medications:  Current Discharge Medication List    CONTINUE these medications which have NOT CHANGED   Details  aspirin 81 MG tablet Take 81 mg by mouth daily.      baclofen (LIORESAL) 10 MG tablet Take 1 tablet (10 mg total) by mouth every 8 (eight) hours as needed. For spasticity Qty: 90 each, Refills: 5    Cetirizine HCl (ZYRTEC ALLERGY) 10 MG CAPS Take 1 capsule (10 mg total) by mouth daily. Qty: 30 capsule, Refills: 1    citalopram (CELEXA) 20 MG tablet Take 20 mg by mouth daily.      clopidogrel (PLAVIX) 75 MG tablet Take 1 tablet (75 mg total) by mouth daily. Qty: 30 tablet, Refills: 6    metoprolol succinate (TOPROL-XL) 25 MG 24 hr tablet Take 0.5 tablets (12.5 mg total) by mouth daily. Qty: 30 tablet, Refills: 6    nitroGLYCERIN (NITROSTAT) 0.4 MG SL tablet Place 0.4 mg under the tongue every 5 (five) minutes x 3 doses as needed.      pantoprazole (PROTONIX) 40 MG tablet Take 1 tablet (40 mg total) by mouth daily. Qty: 30 tablet, Refills: 6    pravastatin (PRAVACHOL) 40 MG tablet Take 1 tablet (40 mg total) by mouth daily. Qty: 30 tablet, Refills: 6    traMADol (ULTRAM) 50 MG tablet Take 1 tablet (50 mg total) by mouth every 8 (eight) hours as needed. For pain Qty: 60 tablet, Refills: 2    traZODone (DESYREL) 100 MG tablet Take 100 mg by mouth at bedtime.      zolpidem (AMBIEN) 10 MG tablet Take 10 mg by mouth at bedtime as needed.          Outstanding Labs/Studies None reported  Duration of Discharge Encounter: 40 minutes including physician time.  Signed, R. Hurman Horn, PA-C 10/06/2011, 2:19 PM  Pt seen and examined with Hurman Horn on day of  discharge.. I agree with the assessment and plan as stated above and I was primarily responsible for formulating discharge plan.  Daniel Bensimhon,MD 1:58 AM

## 2011-10-06 NOTE — H&P (Signed)
[name] is an 45 y.o. female.    Chief Complaint: Epigastric pain  HPI:45 y/o female with a PMH of CAD who presents to ER today complaining of 3 day history of epigastric pain.  She has inferior STEMI 02/2008 requiring placement of a BMS to Cx artery.  She was admitted 06/02/2011 for recurrent pain and underwent a cardiac cath 06/03/2011 which showed 10% LM, LAD with luminal irregularities, 40% OM1, 40% OM2, 50% RCA and occluded mCx stent requiring placement of DES for in-stent restenosis.  She now complains of 3 days history of intermittent epigastric pain that feels like reflux pain. She specifically states that this pain is not similar to the pain she had during her previous admission in 06/2011.  She had a recent Nuclear stress test 07/2011 which was a low-risk scan, and her most recent TTE from 06/20/2011 showed LVEF of 55-60% with mild mitral regurgitation.  Currently, she is asymptomatic,her initial cardiac markers are negative, and she has no significant ECG changes.   Past Medical History  Diagnosis Date  . Coronary artery disease 02/2008    a. s/p INF STEMI 5/09 tx with BMS to CFX;  b. Lexiscan Myoview 12/10 -  low risk, no ischemia.;   c. s/p NSTEMI 8/12: cath with LM 10%, LAD lum. irregs., oOM1 40%, mOM2 40%, mCFX stent occluded, RCA 50%, EF 45-50%; PCI - s/p DES to mCFX   . Bipolar disorder      Followed by mental health, who prescribe her bipolar meds  . Tobacco abuse   . Cerebral palsy      with history of neuropathic pain  and spasticity,  requiring long-term pain medications  . Hypertension   . GERD (gastroesophageal reflux disease)   . Vitamin D deficiency 11/2009     patient treated with high-dose vitamin D weekly x3 months,  levels were rechecked and it was determined that the patient continued to require high dose repletion of vitamin D.  . History of pelvic mass     Found on CT abd/ pelvis (04/2008) - 13.3 cm cystic pelvis mass, possibly left ovarian origin. To be  followed at [facility].   Marland Kitchen HLD (hyperlipidemia)     Past Surgical History  Procedure Date  . Total abdominal hysterectomy 03/2003     secondary to symptomatic fibroids.  . Cesarean section     x2  . Tubal ligation   .  elective abortion     x2  . Other surgical history      heel surgery, bilateral    Family History  Problem Relation Age of Onset  . Lung disease Mother 47    Died of respiratory failure  . Heart attack Father 78   Social History:  reports that she has been smoking Cigarettes.  She has a 7.5 pack-year smoking history. She does not have any smokeless tobacco history on file. She reports that she does not drink alcohol or use illicit drugs.  Allergies:  Allergies  Allergen Reactions  . Penicillins Hives    Medications Prior to Admission  Medication Dose Route Frequency Provider Last Rate Last Dose  . nitroGLYCERIN (NITROGLYN) 2 % ointment 1 inch  1 inch Topical Once Mike Schinlever   1 inch at 10/05/11 2208  . DISCONTD: aspirin tablet 325 mg  325 mg Oral STAT Ethelda Chick, MD       Medications Prior to Admission  Medication Sig Dispense Refill  . aspirin 81 MG tablet Take 81 mg by mouth  daily.        . baclofen (LIORESAL) 10 MG tablet Take 1 tablet (10 mg total) by mouth every 8 (eight) hours as needed. For spasticity  90 each  5  . Cetirizine HCl (ZYRTEC ALLERGY) 10 MG CAPS Take 1 capsule (10 mg total) by mouth daily.  30 capsule  1  . citalopram (CELEXA) 20 MG tablet Take 20 mg by mouth daily.        . clopidogrel (PLAVIX) 75 MG tablet Take 1 tablet (75 mg total) by mouth daily.  30 tablet  6  . metoprolol succinate (TOPROL-XL) 25 MG 24 hr tablet Take 0.5 tablets (12.5 mg total) by mouth daily.  30 tablet  6  . nitroGLYCERIN (NITROSTAT) 0.4 MG SL tablet Place 0.4 mg under the tongue every 5 (five) minutes x 3 doses as needed.        . pantoprazole (PROTONIX) 40 MG tablet Take 1 tablet (40 mg total) by mouth daily.  30 tablet  6  . pravastatin (PRAVACHOL) 40 MG  tablet Take 1 tablet (40 mg total) by mouth daily.  30 tablet  6  . traMADol (ULTRAM) 50 MG tablet Take 1 tablet (50 mg total) by mouth every 8 (eight) hours as needed. For pain  60 tablet  2  . traZODone (DESYREL) 100 MG tablet Take 100 mg by mouth at bedtime.        Marland Kitchen zolpidem (AMBIEN) 10 MG tablet Take 10 mg by mouth at bedtime as needed.          Results for orders placed during the hospital encounter of 10/05/11 (from the past 48 hour(s))  CBC     Status: Normal   Collection Time   10/05/11  7:34 PM      Component Value Range Comment   WBC 8.2  4.0 - 10.5 (K/uL)    RBC 4.70  3.87 - 5.11 (MIL/uL)    Hemoglobin 14.7  12.0 - 15.0 (g/dL)    HCT 16.1  09.6 - 04.5 (%)    MCV 89.8  78.0 - 100.0 (fL)    MCH 31.3  26.0 - 34.0 (pg)    MCHC 34.8  30.0 - 36.0 (g/dL)    RDW 40.9  81.1 - 91.4 (%)    Platelets 240  150 - 400 (K/uL)   BASIC METABOLIC PANEL     Status: Normal   Collection Time   10/05/11  7:34 PM      Component Value Range Comment   Sodium 136  135 - 145 (mEq/L)    Potassium 4.0  3.5 - 5.1 (mEq/L)    Chloride 102  96 - 112 (mEq/L)    CO2 23  19 - 32 (mEq/L)    Glucose, Bld 97  70 - 99 (mg/dL)    BUN 7  6 - 23 (mg/dL)    Creatinine, Ser 7.82  0.50 - 1.10 (mg/dL)    Calcium 95.6  8.4 - 10.5 (mg/dL)    GFR calc non Af Amer >90  >90 (mL/min)    GFR calc Af Amer >90  >90 (mL/min)   PRO B NATRIURETIC PEPTIDE     Status: Normal   Collection Time   10/05/11  7:36 PM      Component Value Range Comment   Pro B Natriuretic peptide (BNP) 25.4  0 - 125 (pg/mL)   POCT I-STAT TROPONIN I     Status: Normal   Collection Time   10/05/11 10:46 PM  Component Value Range Comment   Troponin i, poc 0.00  0.00 - 0.08 (ng/mL)    Comment 3             Dg Chest 2 View  10/05/2011  *RADIOLOGY REPORT*  Clinical Data: Chest pain  CHEST - 2 VIEW  Comparison: 06/02/2011  Findings: Heart size is normal.  No pleural effusion or pulmonary edema.  No airspace consolidation identified.   Visualized osseous structures are unremarkable.  IMPRESSION:  1.  No acute cardiopulmonary abnormalities.  Original Report Authenticated By: Rosealee Albee, M.D.   Dg Shoulder Left  10/05/2011  *RADIOLOGY REPORT*  Clinical Data: Shoulder pain  LEFT SHOULDER - 2+ VIEW  Comparison: None  Findings: There is no evidence of fracture or dislocation.  Mild degenerative changes involve the acromioclavicular joint.  Soft tissues are unremarkable.  IMPRESSION: 1.  No acute findings. 2.  Mild AC joint osteoarthritis.  Original Report Authenticated By: Rosealee Albee, M.D.    Review of Systems  Constitutional: Negative.   HENT: Negative.   Eyes: Negative.   Respiratory: Negative.   Cardiovascular: Negative for palpitations, orthopnea, claudication, leg swelling and PND. Chest pain: Epigastric pain.  Gastrointestinal: Positive for heartburn. Negative for nausea, vomiting, diarrhea, constipation, blood in stool and melena. Abdominal pain: Epigastric pain.  Genitourinary: Negative.   Musculoskeletal: Negative.   Skin: Negative.   Neurological: Negative.   Endo/Heme/Allergies: Negative.   Psychiatric/Behavioral: Negative for suicidal ideas, hallucinations, memory loss and substance abuse. The patient is not nervous/anxious and does not have insomnia.     Blood pressure 134/87, pulse 61, temperature 97.6 F (36.4 C), temperature source Oral, resp. rate 16, weight 80.74 kg (178 lb), last menstrual period 12/22/2007, SpO2 99.00%. Physical Exam   Assessment/Plan  1. Atypical chest pain. Her symptoms are atypical in nature. However, since she is a female and she had a recent in-stent occlusion, will admit to telemetry for overnight observation. We will repeat 2 more sets of cardiac markers and start her on long acting nitrates  2.  CAD s/p DES to mCx instent restenosis: Patient is on Aspirin, beta-blockers, Plavix and Statin which will be continued.  3.  HTN: Better controlled.  Marella Vanderpol  E 10/06/2011, 12:33 AM

## 2011-10-06 NOTE — ED Notes (Signed)
[id number] READY

## 2011-10-06 NOTE — Procedures (Signed)
   Cardiac Catheterization Procedure Note  Name: [name] MRN: [id number] DOB: 25-Aug-1966  Procedure: Left Heart Cath, Selective Coronary Angiography, LV angiography  Indication: Chest pain, history of CAD   Procedural Details: The right wrist was prepped, draped, and anesthetized with 1% lidocaine. Using the modified Seldinger technique, a 5 French sheath was introduced into the right radial artery. 3 mg of verapamil and 200 mcg of nitroglycerine was administered through the sheath, weight-based unfractionated heparin was administered intravenously. Standard Judkins catheters were used for selective coronary angiography and left ventriculography. Catheter exchanges were performed over an exchange length guidewire. There were no immediate procedural complications. A TR band was used for radial hemostasis at the completion of the procedure.  The patient was transferred to the post catheterization recovery area for further monitoring.  Procedural Findings: Hemodynamics: AO 115/68 LV  123/11  Coronary angiography: Coronary dominance: left  Left mainstem: No significant disease.   Left anterior descending (LAD): 25% proximal, 25% mid LAD stenosis.   Left circumflex (LCx): Large vessel supplying left-sided PDA.  Moderate OM1 with 50% ostial stenosis.  Moderate OM2 with 40% ostial stenosis and 30% proximal stenosis.  Patent mid LCx stent.  Small OM3 with 80% ostial stenosis.  Left-sided PDA patent.   Right coronary artery (RCA): Small nondominant vessel with diffuse up to 50% stenosis in the mid to distal vessel.   Left ventriculography: Left ventricular systolic function is normal, LVEF is estimated at 55%, there is basal inferior akinesis.  There is no significant mitral regurgitation   Final Conclusions:  Patent LCx stent.  Diffuse disease up to 50% in small nondominant RCA.  80% ostial stenosis in small OM3.  Recommendations: No major disease, suspect chest pain was noncardiac,  ? GI-related.  OK for discharge later today.   Marca Ancona 10/06/2011, 1:30 PM

## 2011-10-07 NOTE — ED Provider Notes (Signed)
I saw and evaluated the patient, reviewed the resident's note and I agree with the findings and plan.  Ethelda Chick, MD 10/07/11 [phone]

## 2011-11-01 ENCOUNTER — Other Ambulatory Visit (INDEPENDENT_AMBULATORY_CARE_PROVIDER_SITE_OTHER): Payer: Medicare Other | Admitting: *Deleted

## 2011-11-01 ENCOUNTER — Ambulatory Visit (INDEPENDENT_AMBULATORY_CARE_PROVIDER_SITE_OTHER): Payer: Medicare Other | Admitting: Cardiology

## 2011-11-01 ENCOUNTER — Encounter: Payer: Self-pay | Admitting: Cardiology

## 2011-11-01 DIAGNOSIS — I10 Essential (primary) hypertension: Secondary | ICD-10-CM

## 2011-11-01 DIAGNOSIS — E785 Hyperlipidemia, unspecified: Secondary | ICD-10-CM

## 2011-11-01 DIAGNOSIS — F17200 Nicotine dependence, unspecified, uncomplicated: Secondary | ICD-10-CM

## 2011-11-01 DIAGNOSIS — I2510 Atherosclerotic heart disease of native coronary artery without angina pectoris: Secondary | ICD-10-CM

## 2011-11-01 DIAGNOSIS — Z720 Tobacco use: Secondary | ICD-10-CM

## 2011-11-01 LAB — LIPID PANEL
Cholesterol: 149 mg/dL (ref 0–200)
HDL: 44 mg/dL (ref >39.00)
Total CHOL/HDL Ratio: 3
Triglycerides: 69 mg/dL (ref 0.0–149.0)

## 2011-11-01 LAB — HEPATIC FUNCTION PANEL
Albumin: 3.8 g/dL (ref 3.5–5.2)
Bilirubin, Direct: 0 mg/dL (ref 0.0–0.3)
Total Protein: 7.1 g/dL (ref 6.0–8.3)

## 2011-11-01 NOTE — Assessment & Plan Note (Signed)
Blood pressure controlled. Continue present medications. 

## 2011-11-01 NOTE — Assessment & Plan Note (Signed)
Continue statin. Check lipids and liver. 

## 2011-11-01 NOTE — Assessment & Plan Note (Signed)
Patient counseled on discontinuing. 

## 2011-11-01 NOTE — Progress Notes (Signed)
HPI: Pleasant female previously followed by Dr Gala Romney for FU of CAD. She has a history of CAD, status post bare-metal stent to the circumflex in 2009 in the setting of inferior STEMI, hypertension and hyperlipidemia as well as cerebral palsy and bipolar disorder.  She was admitted in August of 2012 and ruled in for NSTEMI. She underwent cardiac catheterization 06/03/11: LM 10%, LAD lum. irregs., oOM1 40%, mOM2 40%, mCFX stent occluded, RCA 50%, EF 45-50%. She underwent PCI with placement of a DES to mCFX 2/2 in-stent restenosis. Echo 9/12 showed EF 55-60, mild MR. Myoview in Oct 2012 for continuing chest pain showed EF 54 and breast attenuation. Repeat cath Dec 2012 for chest pain showed Left mainstem: No significant disease. Left anterior descending (LAD): 25% proximal, 25% mid LAD stenosis. Left circumflex (LCx): Large vessel supplying left-sided PDA. Moderate OM1 with 50% ostial stenosis. Moderate OM2 with 40% ostial stenosis and 30% proximal stenosis. Patent mid LCx stent. Small OM3 with 80% ostial stenosis. Left-sided PDA patent. Right coronary artery (RCA): Small nondominant vessel with diffuse up to 50% stenosis in the mid to distal vessel. EF 55 with basal inferior akinesis. Since then, she denies dyspnea on exertion pedal edema chest pain or syncope.   Current Outpatient Prescriptions  Medication Sig Dispense Refill  . aspirin 81 MG tablet Take 81 mg by mouth daily.        . baclofen (LIORESAL) 10 MG tablet Take 1 tablet (10 mg total) by mouth every 8 (eight) hours as needed. For spasticity  90 each  5  . Cetirizine HCl (ZYRTEC ALLERGY) 10 MG CAPS Take 1 capsule (10 mg total) by mouth daily.  30 capsule  1  . citalopram (CELEXA) 20 MG tablet Take 20 mg by mouth daily.        . clopidogrel (PLAVIX) 75 MG tablet Take 1 tablet (75 mg total) by mouth daily.  30 tablet  6  . metoprolol succinate (TOPROL-XL) 25 MG 24 hr tablet Take 0.5 tablets (12.5 mg total) by mouth daily.  30 tablet  6  .  nitroGLYCERIN (NITROSTAT) 0.4 MG SL tablet Place 0.4 mg under the tongue every 5 (five) minutes x 3 doses as needed.        . pantoprazole (PROTONIX) 40 MG tablet Take 1 tablet (40 mg total) by mouth daily.  30 tablet  6  . pravastatin (PRAVACHOL) 40 MG tablet Take 1 tablet (40 mg total) by mouth daily.  30 tablet  6  . traMADol (ULTRAM) 50 MG tablet Take 1 tablet (50 mg total) by mouth every 8 (eight) hours as needed. For pain  60 tablet  2  . traZODone (DESYREL) 100 MG tablet Take 100 mg by mouth at bedtime.        Marland Kitchen zolpidem (AMBIEN) 10 MG tablet Take 10 mg by mouth at bedtime as needed.           Past Medical History  Diagnosis Date  . Coronary artery disease 02/2008    a. s/p INF STEMI 5/09 tx with BMS to CFX;  b. Lexiscan Myoview 12/10 -  low risk, no ischemia.;   c. s/p NSTEMI 8/12: cath with LM 10%, LAD lum. irregs., oOM1 40%, mOM2 40%, mCFX stent occluded, RCA 50%, EF 45-50%; PCI - s/p DES to mCFX  d. Patent LCx stent, diffuse disease up to 50% in small nondominant RCA, 80% ostial stenosis in small OM3.   . Bipolar disorder      Followed by mental health,  who prescribe her bipolar meds  . Cerebral palsy      with history of neuropathic pain  and spasticity,  requiring long-term pain medications  . Hypertension   . GERD (gastroesophageal reflux disease)   . Vitamin D deficiency 11/2009     patient treated with high-dose vitamin D weekly x3 months,  levels were rechecked and it was determined that the patient continued to require high dose repletion of vitamin D.  . History of pelvic mass     Found on CT abd/ pelvis (04/2008) - 13.3 cm cystic pelvis mass, possibly left ovarian origin. To be followed at [facility].   Marland Kitchen HLD (hyperlipidemia)     Past Surgical History  Procedure Date  . Total abdominal hysterectomy 03/2003     secondary to symptomatic fibroids.  . Cesarean section     x2  . Tubal ligation   .  elective abortion     x2  . Other surgical history      heel surgery,  bilateral    History   Social History  . Marital Status: Divorced    Spouse Name: N/A    Number of Children: N/A  . Years of Education: N/A   Occupational History  . Not on file.   Social History Main Topics  . Smoking status: Current Everyday Smoker -- 0.3 packs/day for 25 years    Types: Cigarettes  . Smokeless tobacco: Not on file   Comment: Since August 24th  . Alcohol Use: No  . Drug Use: No  . Sexually Active: Not Currently   Other Topics Concern  . Not on file   Social History Narrative  . No narrative on file    ROS: Headache but no fevers or chills, productive cough, hemoptysis, dysphasia, odynophagia, melena, hematochezia, dysuria, hematuria, rash, seizure activity, orthopnea, PND, pedal edema, claudication. Remaining systems are negative.  Physical Exam: Well-developed well-nourished in no acute distress.  Skin is warm and dry.  HEENT is normal.  Neck is supple. No thyromegaly.  Chest is clear to auscultation with normal expansion.  Cardiovascular exam is regular rate and rhythm.  Abdominal exam nontender or distended. No masses palpated. Extremities show no edema.

## 2011-11-01 NOTE — Patient Instructions (Signed)
Your physician wants you to follow-up in: 6 MONTHS You will receive a reminder letter in the mail two months in advance. If you don't receive a letter, please call our office to schedule the follow-up appointment.   Your physician recommends that you return for lab work in: TODAY  

## 2011-11-01 NOTE — Assessment & Plan Note (Signed)
Continue aspirin, statin and beta blocker. Continue Plavix until August 2013.

## 2011-11-02 ENCOUNTER — Other Ambulatory Visit: Payer: Self-pay | Admitting: Physician Assistant

## 2011-11-02 ENCOUNTER — Other Ambulatory Visit: Payer: Self-pay | Admitting: *Deleted

## 2011-11-02 DIAGNOSIS — E785 Hyperlipidemia, unspecified: Secondary | ICD-10-CM

## 2011-11-02 MED ORDER — PRAVASTATIN SODIUM 40 MG PO TABS: ORAL_TABLET | ORAL | Status: DC

## 2011-11-02 NOTE — Telephone Encounter (Signed)
pt notified of lab results and increase in pravastatin, pt coming in 01/05/12 for repeat FLP/LFT. Danielle Rankin

## 2011-11-13 ENCOUNTER — Telehealth: Payer: Self-pay | Admitting: Cardiology

## 2011-11-13 DIAGNOSIS — E785 Hyperlipidemia, unspecified: Secondary | ICD-10-CM

## 2011-11-13 MED ORDER — METOPROLOL SUCCINATE ER 25 MG PO TB24: 12.5000 mg | ORAL_TABLET | Freq: Every day | ORAL | Status: DC

## 2011-11-13 MED ORDER — PRAVASTATIN SODIUM 40 MG PO TABS: 40.0000 mg | ORAL_TABLET | Freq: Every day | ORAL | Status: DC

## 2011-11-13 MED ORDER — CLOPIDOGREL BISULFATE 75 MG PO TABS: 75.0000 mg | ORAL_TABLET | Freq: Every day | ORAL | Status: DC

## 2011-11-13 NOTE — Telephone Encounter (Signed)
New msg Pt wants all her meds switched to [facility] on [address] please call her when done

## 2011-11-21 ENCOUNTER — Encounter: Payer: Self-pay | Admitting: Internal Medicine

## 2011-11-21 ENCOUNTER — Ambulatory Visit (INDEPENDENT_AMBULATORY_CARE_PROVIDER_SITE_OTHER): Payer: Medicare Other | Admitting: Internal Medicine

## 2011-11-21 VITALS — BP 134/90 | HR 59 | Temp 97.1°F | Ht 65.0 in | Wt 184.0 lb

## 2011-11-21 DIAGNOSIS — F17200 Nicotine dependence, unspecified, uncomplicated: Secondary | ICD-10-CM

## 2011-11-21 DIAGNOSIS — Z0000 Encounter for general adult medical examination without abnormal findings: Secondary | ICD-10-CM

## 2011-11-21 DIAGNOSIS — R7303 Prediabetes: Secondary | ICD-10-CM

## 2011-11-21 DIAGNOSIS — R51 Headache: Secondary | ICD-10-CM

## 2011-11-21 DIAGNOSIS — K219 Gastro-esophageal reflux disease without esophagitis: Secondary | ICD-10-CM

## 2011-11-21 DIAGNOSIS — R7309 Other abnormal glucose: Secondary | ICD-10-CM

## 2011-11-21 DIAGNOSIS — R519 Headache, unspecified: Secondary | ICD-10-CM

## 2011-11-21 DIAGNOSIS — E559 Vitamin D deficiency, unspecified: Secondary | ICD-10-CM

## 2011-11-21 DIAGNOSIS — I10 Essential (primary) hypertension: Secondary | ICD-10-CM

## 2011-11-21 DIAGNOSIS — E785 Hyperlipidemia, unspecified: Secondary | ICD-10-CM

## 2011-11-21 DIAGNOSIS — I2510 Atherosclerotic heart disease of native coronary artery without angina pectoris: Secondary | ICD-10-CM

## 2011-11-21 DIAGNOSIS — Z720 Tobacco use: Secondary | ICD-10-CM

## 2011-11-21 HISTORY — DX: Headache, unspecified: R51.9

## 2011-11-21 HISTORY — DX: Prediabetes: R73.03

## 2011-11-21 MED ORDER — CETIRIZINE HCL 10 MG PO CAPS: 10.0000 mg | ORAL_CAPSULE | Freq: Every day | ORAL | Status: DC

## 2011-11-21 MED ORDER — METOPROLOL SUCCINATE ER 25 MG PO TB24: 12.5000 mg | ORAL_TABLET | Freq: Every day | ORAL | Status: DC

## 2011-11-21 MED ORDER — BACLOFEN 10 MG PO TABS: 10.0000 mg | ORAL_TABLET | Freq: Three times a day (TID) | ORAL | Status: DC | PRN

## 2011-11-21 MED ORDER — NITROGLYCERIN 0.4 MG SL SUBL: 0.4000 mg | SUBLINGUAL_TABLET | SUBLINGUAL | Status: DC | PRN

## 2011-11-21 MED ORDER — TRAMADOL HCL 50 MG PO TABS: 50.0000 mg | ORAL_TABLET | Freq: Three times a day (TID) | ORAL | Status: DC | PRN

## 2011-11-21 MED ORDER — PRAVASTATIN SODIUM 40 MG PO TABS: 40.0000 mg | ORAL_TABLET | Freq: Every day | ORAL | Status: DC

## 2011-11-21 MED ORDER — PANTOPRAZOLE SODIUM 40 MG PO TBEC: 40.0000 mg | DELAYED_RELEASE_TABLET | Freq: Every day | ORAL | Status: DC

## 2011-11-21 MED ORDER — CLOPIDOGREL BISULFATE 75 MG PO TABS
75.0000 mg | ORAL_TABLET | Freq: Every day | ORAL | Status: DC
Start: 2011-11-21 — End: 2011-12-24

## 2011-11-21 MED ORDER — ERGOCALCIFEROL 1.25 MG (50000 UT) PO CAPS: 50000.0000 [IU] | ORAL_CAPSULE | ORAL | Status: DC

## 2011-11-21 MED ORDER — IBUPROFEN 600 MG PO TABS: 600.0000 mg | ORAL_TABLET | Freq: Four times a day (QID) | ORAL | Status: AC | PRN

## 2011-11-21 NOTE — Assessment & Plan Note (Signed)
Liver Function Tests:    Component Value Date/Time   AST 17 11/01/2011 0922   ALT 18 11/01/2011 0922   ALKPHOS 75 11/01/2011 0922   BILITOT 0.4 11/01/2011 0922   PROT 7.1 11/01/2011 0922   ALBUMIN 3.8 11/01/2011 0922    Lipid Panel:     Component Value Date/Time   CHOL 149 11/01/2011 0922   TRIG 69.0 11/01/2011 0922   HDL 44.00 11/01/2011 0922   CHOLHDL 3 11/01/2011 0922   VLDL 13.8 11/01/2011 0922   LDLCALC 91 11/01/2011 0922     Assessment: Status:  Pravastatin was apparently escalated to 40mg  daily in 10/2010 per pt report  Disease Control: controlled  Progress toward goals: at goal  Barriers to meeting goals: no barriers identified   Plan:  continue current medications

## 2011-11-21 NOTE — Assessment & Plan Note (Addendum)
BP Readings from Last 3 Encounters:  11/21/11 134/90  11/01/11 124/77  10/06/11 120/75    Basic Metabolic Panel:    Component Value Date/Time   NA 138 10/06/2011 0515   K 3.6 10/06/2011 0515   CL 104 10/06/2011 0515   CO2 24 10/06/2011 0515   BUN 10 10/06/2011 0515   CREATININE 0.71 10/06/2011 0515   GLUCOSE 123* 10/06/2011 0515   CALCIUM 9.7 10/06/2011 0515    Assessment: Status: currently in pain, last took her Toprol XL last night.  Disease Control: not controlled  Progress toward goals: deteriorated  Barriers to meeting goals: no barriers identified   Plan:  continue current medications  Will recheck next visit, and escalate therapy as needed as that time.  Recommend smoking cessation.

## 2011-11-21 NOTE — Progress Notes (Signed)
Subjective:   Patient ID: [name] female   DOB: 1966-03-31 46 y.o.   MRN: [id number]  HPI: [name] is a 46 y.o. with a PMHx of CAD s/p DES in 05/2011 and prior in 02/2008, prediabetes, hypertension, tobacco abuse who presented to clinic today for the following:  1) HTN - Patient does not check blood pressure regularly at home. Currently taking Toprol XL 12.5mg  daily. She was previously on 25mg  dosage, but decreased to 12.5 mg daily in 06/2011 secondary to symptomatic bradycardia to 40s.Marland Kitchen admits to headache today. Denies dizziness, lightheadedness, chest pain, shortness of breath.  does request refills today.  2) Vitamin D deficiency - patient has history of vitamin D deficiency, last 25OH vitamin D was 17 in 06/2011, she was prescribed vitamin D high dose in 06/2011, however she lost the prescription and never got it filled. Denies recent increased fatigue, broken bones, brittle hair or teeth.   3) Tobacco abuse - pt continues to smoke 1/2 ppd, wants to quit, but worried about gaining weight. Not having frequent cough, weight loss. Is unable to afford cessation materials.   4) Prediabetes - HgA1c is 6 in 09/2011 - during the cardiac risk factor workup, A1c was checked, found to be mildly elevated. Not to level of diabetes. Pt was unaware of this. Patient does admit to increased intake of cookies, sodas, caffeine, especially since trying to quit smoking. denies polyuria, polydipsia, nausea, vomiting, diarrhea.    5) Headaches - HA - Pt describes throbbing pain, bilateral in the frontal area, has been intermittent over last 3 weeks with attacks lasting sometimes all day and can last a few days at a time. Onset is sudden. Precipitating factors: caffeine intake, gingerale, foods with vinegar. Aggravating factors: bright light and loud noise, emotional stress, exercise (such as bicycling, climbing stairs, etc.), alleviating factors: unable to obtain relief with OTC meds such as extra  strength tylenol (eases it for a while). denies recent trauma, MVA, falls. Family history: migraine headaches in mother. admits to associated light sensitivity and sound sensitivity. Had glasses replaced in November 2012.    Review of Systems: Per HPI.   Medication Sig  . aspirin 81 MG tablet Take 81 mg by mouth daily.    . baclofen (LIORESAL) 10 MG tablet Take 1 tablet (10 mg total) by mouth every 8 (eight) hours as needed. For spasticity  . Cetirizine HCl (ZYRTEC ALLERGY) 10 MG CAPS Take 1 capsule (10 mg total) by mouth daily.  . citalopram (CELEXA) 20 MG tablet Take 20 mg by mouth daily.    . clopidogrel (PLAVIX) 75 MG tablet Take 1 tablet (75 mg total) by mouth daily.  . metoprolol succinate (TOPROL-XL) 25 MG 24 hr tablet Take 0.5 tablets (12.5 mg total) by mouth daily.  . nitroGLYCERIN (NITROSTAT) 0.4 MG SL tablet Place 0.4 mg under the tongue every 5 (five) minutes x 3 doses as needed.    . pantoprazole (PROTONIX) 40 MG tablet Take 1 tablet (40 mg total) by mouth daily.  . pravastatin (PRAVACHOL) 40 MG tablet Take 1 tablet (40 mg total) by mouth daily.  . traMADol (ULTRAM) 50 MG tablet Take 1 tablet (50 mg total) by mouth every 8 (eight) hours as needed. For pain  . traZODone (DESYREL) 100 MG tablet Take 100 mg by mouth at bedtime.    Marland Kitchen zolpidem (AMBIEN) 10 MG tablet Take 10 mg by mouth at bedtime as needed.      Allergies  Allergen Reactions  . Penicillins  Hives    Past Medical History  Diagnosis Date  . Coronary artery disease 02/2008    a. s/p INF STEMI 5/09 tx with BMS to CFX;  b. Lexiscan Myoview 12/10 -  low risk, no ischemia.;   c. s/p NSTEMI 8/12: cath with LM 10%, LAD lum. irregs., oOM1 40%, mOM2 40%, mCFX stent occluded, RCA 50%, EF 45-50%; PCI - s/p DES to mCFX  d. Patent LCx stent, diffuse disease up to 50% in small nondominant RCA, 80% ostial stenosis in small OM3.   . Bipolar disorder      Followed by mental health, who prescribe her bipolar meds  . Cerebral palsy       with history of neuropathic pain  and spasticity,  requiring long-term pain medications  . Hypertension   . GERD (gastroesophageal reflux disease)   . Vitamin d deficiency 11/2009     patient treated with high-dose vitamin D weekly x3 months,  levels were rechecked and it was determined that the patient continued to require high dose repletion of vitamin D.  . History of pelvic mass     Found on CT abd/ pelvis (04/2008) - 13.3 cm cystic pelvis mass, possibly left ovarian origin. To be [facility] at [facility].   Marland Kitchen HLD (hyperlipidemia)     Past Surgical History  Procedure Date  . Total abdominal hysterectomy 03/2003     secondary to symptomatic fibroids.  . Cesarean section     x2  . Tubal ligation   .  elective abortion     x2  . Other surgical history      heel surgery, bilateral     Objective:   Physical Exam: Filed Vitals:   11/21/11 1128  BP: 165/103  Pulse: 71  Temp: 97.1 F (36.2 C)    Repeat BP: 134/90 General: Vital signs reviewed and noted. Well-developed, well-nourished, in no acute distress; alert, appropriate and cooperative throughout examination.  Head: Normocephalic, atraumatic.  Eyes: conjunctivae/corneas clear. PERRL, EOM's intact. Fundi benign.  Ears: TM nonerythematous, not bulging, good light reflex bilaterally.  Nose: Mucous membranes moist, inflammed, clear discharge nonerythematous.  Throat: Oropharynx mild erythematous, no exudate appreciated.   Neck: No deformities, masses, or tenderness noted. No lymphadenopathy.  Lungs:  Normal respiratory effort. Clear to auscultation BL without crackles or wheezes.  Heart: RRR. S1 and S2 normal without gallop, murmur, or rubs.  Abdomen:  BS normoactive. Soft, Nondistended, non-tender.  No masses or organomegaly.  Extremities: No pretibial edema.  Neurologic: A&O X3, CN II - XII are grossly intact. Motor strength is 5/5 in the all upper extremities, Sensations intact to light touch. Decreased muscle strength  3-4/5 in BL LE chronic      Assessment & Plan:  Case and plan of care discussed with Dr. Margarito Liner.

## 2011-11-21 NOTE — Assessment & Plan Note (Signed)
Assessment: Disease Control: controlled  Progress toward goals: at goal  Barriers to meeting goals: no barriers identified. Cardiology seems to be ok with her being on a PPI and Plavix simultaneously as they started her on the PPI in 07/2011.   Plan:     continue current medications   Refill today.

## 2011-11-21 NOTE — Assessment & Plan Note (Signed)
Unclear etiology, although migraine headache (in setting of prolonged course, light/ sound sensitivity, family hx of migraines) versus caffeine-induced or caffeine withdrawal headache most likely. Less likely due to recent change in prescription glasses, as this was back in 08/2011.   Will given ibuprofen 600mg  QID PRN for pain - pt advised extensively regarding taking with food, taking minimum possible, calling clinic if causes exacerbation of her GERD.  Can use her Tramadol or tylenol extra strength if severe and refractory.

## 2011-11-21 NOTE — Assessment & Plan Note (Signed)
1. The patient was counseled on the dangers of tobacco use, and was advised to quit - pt still at half a pack.   2. Reviewed strategies to maximize success, including:  Removing cigarettes and smoking materials from environment  Stress management  Substitution of other forms of reinforcement Support of family/friends.  Selecting a quit date.

## 2011-11-21 NOTE — Assessment & Plan Note (Signed)
   Refuses Tdap today, promises to get next visit.

## 2011-11-21 NOTE — Assessment & Plan Note (Addendum)
Labs: Lab Results  Component Value Date   HGBA1C 6.0* 10/06/2011   CREATININE 0.71 10/06/2011   CHOL 149 11/01/2011   HDL 44.00 11/01/2011   TRIG 69.0 11/01/2011    Assessment:     Status:  Patient was unaware of this diagnoses   Barriers to meeting goals: dietary indiscretions   Plan: Glucometer log was not reviewed today, as pt did not have glucometer available for review.      recommended adjusting dietary intake to low carb, exercising as tolerated.  Refused to follow-up for nutrition counseling with Norm Parcel - will readdress at subsequent visits.  Recheck A1c next visit in 2 months.

## 2011-11-21 NOTE — Patient Instructions (Signed)
Please follow-up at the clinic in 2 months with me, at which time we will reevaluate your blood pressure, prediabetes, smoking, and get you your Tdap vaccination.  Please try to work on stopping to smoke - lets shoot for 1/4 pack per day by the next time you see me.  There have been changes in your medications.  START vitamin D replacement once weekly  START ibuprofen - maximum up to 4 times a day - DO NOT take more than this. Take with food to help your stomach.   Please follow-up in the clinic sooner if needed.  If you have been started on new medication(s), and you develop symptoms concerning for allergic reaction, including, but not limited to, throat closing, tongue swelling, rash, please stop the medication immediately and call the clinic at [phone], and go to the ER.  If you are diabetic, please bring your meter to your next visit.  If symptoms worsen, or new symptoms arise, please call the clinic or go to the ER.  Please bring all of your medications in a bag to your next visit.     Diet for GERD or PUD   Nutrition therapy can help ease the discomfort of gastroesophageal reflux disease (GERD) and peptic ulcer disease (PUD).  HOME CARE INSTRUCTIONS  Eat your meals slowly, in a relaxed setting.  Eat 5 to 6 small meals per day.  If a food causes distress, stop eating it for a period of time.  FOODS TO AVOID:  Coffee, regular or decaffeinated.  Cola beverages, regular or low calorie.  Tea, regular or decaffeinated.  Pepper.  Cocoa.  High fat foods including meats.  Butter, margarine, hydrogenated oil (trans fats).  Peppermint or spearmint (if you have GERD).  Fruits and vegetables as tolerated.  Alcoholic beverages.  Nicotine (smoking or chewing). This is one of the most potent stimulants to acid production in the gastrointestinal tract.  Any food that seems to aggravate your condition.   If you have questions regarding your diet, call your caregiver's office or a  registered dietitian.  OTHER TIPS IF YOU HAVE GERD:  Lying flat may make symptoms worse. Keep the head of your bed raised 6 to 9 inches by using a foam wedge or blocks under the legs of the bed.  Do not lay down until 3 hours after eating a meal.  Daily physical activity may help reduce symptoms.  MAKE SURE YOU:  Understand these instructions.  Will watch your condition.  Will get help right away if you are not doing well or get worse.  Document Released: 09/25/2005 Document Re-Released: 02/11/2009  [facility] Patient Information 2011 [place], [place].  Diabetes, Eating Away From Home Sometimes, you might eat in a restaurant or have meals that are prepared by someone else. You can enjoy eating out. However, the portions in restaurants may be much larger than needed. Listed below are some ideas to help you choose foods that will keep your blood glucose (sugar) in better control.   TIPS FOR EATING OUT  Know your meal plan and how many carbohydrate servings you should have at each meal. You may wish to carry a copy of your meal plan in your purse or wallet. Learn the foods included in each food group.     Make a list of restaurants near you that offer healthy choices. Take a copy of the carry-out menus to see what they offer. Then, you can plan what you will order ahead of time.  Become familiar with serving sizes by practicing them at home using measuring cups and spoons. Once you learn to recognize portion sizes, you will be able to correctly estimate the amount of total carbohydrate you are allowed to eat at the restaurant. Ask for a takeout box if the portion is more than you should have. When your food comes, leave the amount you should have on the plate, and put the rest in the takeout box before you start eating.     Plan ahead if your mealtime will be different from usual. Check with your caregiver to find out how to time meals and medicine if you are taking insulin.     Avoid high-fat  foods, such as fried foods, cream sauces, high-fat salad dressings, or any added butter or margarine.     Do not be afraid to ask questions. Ask your server about the portion size, cooking methods, ingredients and if items can be substituted. Restaurants do not list all available items on the menu. You can ask for your main entree to be prepared using skim milk, oil instead of butter or margarine, and without gravy or sauces. Ask your waiter or waitress to serve salad dressings, gravy, sauces, margarine, and sour cream on the side. You can then add the amount your meal plan suggests.     Add more vegetables whenever possible.     Avoid items that are labeled "jumbo," "giant," "deluxe," or "supersized."     You may want to split an entre with someone and order an extra side salad.     Watch for hidden calories in foods like croutons, bacon, or cheese.     Ask your server to take away the bread basket or chips from your table.     Order a dinner salad as an appetizer.  You can eat most foods served in a restaurant. Some foods are better choices than others. Breads and Starches  Recommended: All kinds of bread (wheat, rye, white, oatmeal, [place], [place], raisin), hard or soft dinner rolls, frankfurter or hamburger buns, small bagels, small corn or whole-wheat flour tortillas.     Avoid: Frosted or glazed breads, butter rolls, egg or cheese breads, croissants, sweet rolls, pastries, coffee cake, glazed or frosted doughnuts, muffins.  Crackers  Recommended: Animal crackers, graham, rye, saltine, oyster, and matzoth crackers. Bread sticks, melba toast, rusks, pretzels, popcorn (without fat), zwieback toast.     Avoid: High-fat snack crackers or chips. Buttered popcorn.  Cereals  Recommended: Hot and cold cereals. Whole grains such as oatmeal or shredded wheat are good choices.     Avoid: Sugar-coated or granola type cereals.  Potatoes/Pasta/Rice/Beans  Recommended: Order baked, boiled,  or mashed potatoes, rice or noodles without added fat, whole beans. Order gravies, butter, margarine, or sauces on the side so you can control the amount you add.     Avoid: Hash browns or fried potatoes. Potatoes, pasta, or rice prepared with cream or cheese sauce. Potato or pasta salads prepared with large amounts of dressing. Fried beans or fried rice.  Vegetables  Recommended: Order steamed, baked, boiled, or stewed vegetables without sauces or extra fat. Ask that sauce be served on the side. If vegetables are not listed on the menu, ask what is available.     Avoid: Vegetables prepared with cream, butter, or cheese sauce. Fried vegetables.  Salad Bars  Recommended: Many of the vegetables at a salad bar are considered "free." Use lemon juice, vinegar, or low-calorie salad dressing (fewer than  20 calories per serving) as "free" dressings for your salad. Look for salad bar ingredients that have no added fat or sugar such as tomatoes, lettuce, cucumbers, broccoli, carrots, onions, and mushrooms.     Avoid: Prepared salads with large amounts of dressing, such as coleslaw, caesar salad, macaroni salad, bean salad, or carrot salad.  Fruit  Recommended: Eat fresh fruit or fresh fruit salad without added dressing. A salad bar often offers fresh fruit choices, but canned fruit at a restaurant is usually packed in sugar or syrup.     Avoid: Sweetened canned or frozen fruits, plain or sweetened fruit juice. Fruit salads with dressing, sour cream, or sugar added to them.  Meat and Meat Substitutes  Recommended: Order broiled, baked, roasted, or grilled meat, poultry, or fish. Trim off all visible fat. Do not eat the skin of poultry. The size stated on the menu is the raw weight. Meat shrinks by  in cooking (for example, 4 oz raw equals 3 oz cooked meat).     Avoid: Deep-fat fried meat, poultry, or fish. Breaded meats.  Eggs  Recommended: Order soft, hard-cooked, poached, or scrambled eggs. Omelets  may be okay, depending on what ingredients are added. Egg substitutes are also a good choice.     Avoid: Fried eggs, eggs prepared with cream or cheese sauce.  Milk  Recommended: Order low-fat or fat-free milk according to your meal plan. Plain, nonfat yogurt or flavored yogurt with no sugar added may be used as a substitute for milk. Soy milk may also be used.     Avoid: Milk shakes or sweetened milk beverages.  Soups and Combination Foods  Recommended: Clear broth or consomm are "free" foods and may be used as an appetizer. Broth-based soups with fat removed count as a starch serving and are preferred over cream soups. Soups made with beans or split peas may be eaten but count as a starch.     Avoid: Fatty soups, soup made with cream, cheese soup. Combination foods prepared with excessive amounts of fat or with cream or cheese sauces.  Desserts and Sweets  Recommended: Ask for fresh fruit. Sponge or angel food cake without icing, ice milk, no sugar added ice cream, sherbet, or frozen yogurt may fit into your meal plan occasionally.     Avoid: Pastries, puddings, pies, cakes with icing, custard, gelatin desserts.  Fats and Oils  Recommended: Choose healthy fats such as olive oil, canola oil, or tub margarine, reduced fat or fat-free sour cream, cream cheese, avocado, or nuts.     Avoid: Any fats in excess of your allowed portion. Deep-fried foods or any food with a large amount of fat.  Note: Ask for all fats to be served on the side, and limit your portion sizes according to your meal plan. Document Released: 09/25/2005 Document Revised: 06/07/2011 Document Reviewed: 04/15/2009 [facility] Patient Information 2012 [place], [place].

## 2011-11-21 NOTE — Assessment & Plan Note (Signed)
Labs:  Ref. Range 06/29/2011 14:30  Vit D, 25-Hydroxy Latest Range: 30-89 ng/mL 17 (L)    Assessment: Status:  Pt has been prescribed high dose vitamin D supplementation in 06/2011, never got it filled, didn't understand why she had to be on high dose supplementation as opposed to over the counter.  Disease Control: not controlled  Progress toward goals: unchanged  Barriers to meeting goals: nonadherence to medications   Plan:     Refilled ergocalciferol 50,000 units x 8 weeks.   Recheck vitamin D level at that time to see if additional high-dose supplementation needed, will otherwise transition to 800 units daily.

## 2011-11-29 ENCOUNTER — Telehealth: Payer: Self-pay | Admitting: Cardiology

## 2011-11-29 NOTE — Telephone Encounter (Signed)
Spoke with pt, orders changed to [facility] and mailed to pt home

## 2011-11-29 NOTE — Telephone Encounter (Signed)
New Problem  Patient would like to know if she could send her lab orders for 01/08/12 appnt here at [facility] over to [facility] for 01/09/12 to avoid 2 needle sticks and she needs to have diabetes f/u with [facility] on that day as well.  Please return call to patient at hm# [phone].  If this can be done, please cancel patient 01/08/12 appnt before sending order.  Thank You

## 2011-12-24 ENCOUNTER — Other Ambulatory Visit: Payer: Self-pay

## 2011-12-24 ENCOUNTER — Emergency Department (HOSPITAL_COMMUNITY): Payer: Medicare Other

## 2011-12-24 ENCOUNTER — Encounter (HOSPITAL_COMMUNITY): Payer: Self-pay | Admitting: *Deleted

## 2011-12-24 ENCOUNTER — Emergency Department (HOSPITAL_COMMUNITY)
Admission: EM | Admit: 2011-12-24 | Discharge: 2011-12-25 | Disposition: A | Discharge status: Home / Self Care | Payer: Medicare Other | Attending: Emergency Medicine | Admitting: Emergency Medicine

## 2011-12-24 DIAGNOSIS — G809 Cerebral palsy, unspecified: Secondary | ICD-10-CM | POA: Insufficient documentation

## 2011-12-24 DIAGNOSIS — R05 Cough: Secondary | ICD-10-CM | POA: Insufficient documentation

## 2011-12-24 DIAGNOSIS — F319 Bipolar disorder, unspecified: Secondary | ICD-10-CM | POA: Insufficient documentation

## 2011-12-24 DIAGNOSIS — R079 Chest pain, unspecified: Secondary | ICD-10-CM | POA: Insufficient documentation

## 2011-12-24 DIAGNOSIS — I2510 Atherosclerotic heart disease of native coronary artery without angina pectoris: Secondary | ICD-10-CM | POA: Insufficient documentation

## 2011-12-24 DIAGNOSIS — J189 Pneumonia, unspecified organism: Secondary | ICD-10-CM | POA: Insufficient documentation

## 2011-12-24 DIAGNOSIS — R059 Cough, unspecified: Secondary | ICD-10-CM | POA: Insufficient documentation

## 2011-12-24 DIAGNOSIS — IMO0001 Reserved for inherently not codable concepts without codable children: Secondary | ICD-10-CM | POA: Insufficient documentation

## 2011-12-24 DIAGNOSIS — E559 Vitamin D deficiency, unspecified: Secondary | ICD-10-CM | POA: Insufficient documentation

## 2011-12-24 DIAGNOSIS — E785 Hyperlipidemia, unspecified: Secondary | ICD-10-CM | POA: Insufficient documentation

## 2011-12-24 DIAGNOSIS — F17200 Nicotine dependence, unspecified, uncomplicated: Secondary | ICD-10-CM | POA: Insufficient documentation

## 2011-12-24 DIAGNOSIS — R61 Generalized hyperhidrosis: Secondary | ICD-10-CM | POA: Insufficient documentation

## 2011-12-24 DIAGNOSIS — K219 Gastro-esophageal reflux disease without esophagitis: Secondary | ICD-10-CM | POA: Insufficient documentation

## 2011-12-24 DIAGNOSIS — I10 Essential (primary) hypertension: Secondary | ICD-10-CM | POA: Insufficient documentation

## 2011-12-24 DIAGNOSIS — R509 Fever, unspecified: Secondary | ICD-10-CM | POA: Insufficient documentation

## 2011-12-24 LAB — DIFFERENTIAL
Basophils Absolute: 0 10*3/uL (ref 0.0–0.1)
Eosinophils Relative: 1 % (ref 0–5)
Lymphocytes Relative: 45 % (ref 12–46)
Lymphs Abs: 3.1 10*3/uL (ref 0.7–4.0)
Monocytes Relative: 9 % (ref 3–12)
Neutrophils Relative %: 45 % (ref 43–77)

## 2011-12-24 LAB — CBC
MCV: 88.8 fL (ref 78.0–100.0)
Platelets: 141 10*3/uL — ABNORMAL LOW (ref 150–400)
RBC: 5.11 MIL/uL (ref 3.87–5.11)
RDW: 13.5 % (ref 11.5–15.5)
WBC: 6.8 10*3/uL (ref 4.0–10.5)

## 2011-12-24 LAB — POCT I-STAT TROPONIN I: Troponin i, poc: 0.01 ng/mL (ref 0.00–0.08)

## 2011-12-24 LAB — CK TOTAL AND CKMB (NOT AT ARMC)
CK, MB: 3.3 ng/mL (ref 0.3–4.0)
Relative Index: 0.6 (ref 0.0–2.5)
Total CK: 562 U/L — ABNORMAL HIGH (ref 7–177)

## 2011-12-24 LAB — BASIC METABOLIC PANEL
BUN: 9 mg/dL (ref 6–23)
CO2: 25 mEq/L (ref 19–32)
Calcium: 9.3 mg/dL (ref 8.4–10.5)
Chloride: 100 mEq/L (ref 96–112)
Creatinine, Ser: 0.62 mg/dL (ref 0.50–1.10)
GFR calc Af Amer: >90 mL/min (ref >90)
GFR calc non Af Amer: >90 mL/min (ref >90)
Glucose, Bld: 113 mg/dL — ABNORMAL HIGH (ref 70–99)
Potassium: 3.3 mEq/L — ABNORMAL LOW (ref 3.5–5.1)
Sodium: 137 mEq/L (ref 135–145)

## 2011-12-24 MED ORDER — DEXTROSE 5 % IV SOLN
1.0000 g | Freq: Once | INTRAVENOUS | Status: AC
  Administered 2011-12-24: 1 g via INTRAVENOUS
  Filled 2011-12-24: qty 10

## 2011-12-24 MED ORDER — ALBUTEROL SULFATE HFA 108 (90 BASE) MCG/ACT IN AERS
2.0000 | INHALATION_SPRAY | RESPIRATORY_TRACT | Status: DC | PRN
  Administered 2011-12-25: 2 via RESPIRATORY_TRACT
  Filled 2011-12-24: qty 6.7

## 2011-12-24 MED ORDER — HYDROCOD POLST-CHLORPHEN POLST 10-8 MG/5ML PO LQCR: 5.0000 mL | Freq: Two times a day (BID) | ORAL | Status: DC | PRN

## 2011-12-24 MED ORDER — ALBUTEROL SULFATE (5 MG/ML) 0.5% IN NEBU
5.0000 mg | INHALATION_SOLUTION | Freq: Once | RESPIRATORY_TRACT | Status: AC
  Administered 2011-12-24: 5 mg via RESPIRATORY_TRACT
  Filled 2011-12-24: qty 1

## 2011-12-24 MED ORDER — AZITHROMYCIN 250 MG PO TABS: 250.0000 mg | ORAL_TABLET | Freq: Every day | ORAL | Status: AC

## 2011-12-24 MED ORDER — SODIUM CHLORIDE 0.9 % IV BOLUS (SEPSIS)
500.0000 mL | Freq: Once | INTRAVENOUS | Status: AC
  Administered 2011-12-24: 500 mL via INTRAVENOUS

## 2011-12-24 MED ORDER — IPRATROPIUM BROMIDE 0.02 % IN SOLN
0.5000 mg | Freq: Once | RESPIRATORY_TRACT | Status: AC
  Administered 2011-12-24: 0.5 mg via RESPIRATORY_TRACT
  Filled 2011-12-24: qty 2.5

## 2011-12-24 MED ORDER — HYDROCOD POLST-CHLORPHEN POLST 10-8 MG/5ML PO LQCR
5.0000 mL | Freq: Once | ORAL | Status: AC
  Administered 2011-12-24: 5 mL via ORAL
  Filled 2011-12-24: qty 5

## 2011-12-24 NOTE — ED Notes (Signed)
She has been ill since Thursday with a cold cough and congestion with a temp.  She has a deep dry cough

## 2011-12-24 NOTE — ED Provider Notes (Signed)
History     CSN: [id number]  Arrival date & time 12/24/11  7829   First MD Initiated Contact with Patient 12/24/11 2053      Chief Complaint  Patient presents with  . Cough   HPI: Patient is a 46 y.o. female presenting with cough. The history is provided by the patient.  Cough This is a new problem. The current episode started more than 2 days ago. The problem occurs constantly. The problem has been gradually worsening. The cough is productive of sputum. The maximum temperature recorded prior to her arrival was 103 to 104 F. The fever has been present for 1 to 2 days. Associated symptoms include chest pain, chills, sweats and myalgias. Pertinent negatives include no shortness of breath and no wheezing. The treatment provided no relief.  Patient reports a 3 day history of flulike symptoms. States she has had a persistent and worsening productive cough and chest tightness. States the chest tightness has been constant and worse with coughing and deep breathing. Fever at home for a little over 24 hours was as high as 103. States has not had fever just today.   Past Medical History  Diagnosis Date  . Coronary artery disease 02/2008    a. s/p INF STEMI 5/09 tx with BMS to CFX;  b. Lexiscan Myoview 12/10 -  low risk, no ischemia.;   c. s/p NSTEMI 8/12: cath with LM 10%, LAD lum. irregs., oOM1 40%, mOM2 40%, mCFX stent occluded, RCA 50%, EF 45-50%; PCI - s/p DES to mCFX  d. Patent LCx stent, diffuse disease up to 50% in small nondominant RCA, 80% ostial stenosis in small OM3.   . Bipolar disorder      Followed by mental health, who prescribe her bipolar meds  . Cerebral palsy      with history of neuropathic pain  and spasticity,  requiring long-term pain medications  . Hypertension   . GERD (gastroesophageal reflux disease)   . Vitamin d deficiency 11/2009     patient treated with high-dose vitamin D weekly x3 months,  levels were rechecked and it was determined that the patient continued to  require high dose repletion of vitamin D.  . History of pelvic mass     Found on CT abd/ pelvis (04/2008) - 13.3 cm cystic pelvis mass, possibly left ovarian origin. To be followed at [facility].   Marland Kitchen HLD (hyperlipidemia)     Past Surgical History  Procedure Date  . Total abdominal hysterectomy 03/2003     secondary to symptomatic fibroids.  . Cesarean section     x2  . Tubal ligation   .  elective abortion     x2  . Other surgical history      heel surgery, bilateral  . Stens     Family History  Problem Relation Age of Onset  . Lung disease Mother 5    Died of respiratory failure  . Heart attack Father 45    History  Substance Use Topics  . Smoking status: Current Everyday Smoker -- 0.3 packs/day for 25 years    Types: Cigarettes  . Smokeless tobacco: Not on file   Comment: Since August 24th  . Alcohol Use: 0.0 oz/week    OB History    Grav Para Term Preterm Abortions TAB SAB Ect Mult Living                  Review of Systems  Constitutional: Positive for chills.  HENT: Negative.   Eyes:  Negative.   Respiratory: Positive for cough. Negative for shortness of breath and wheezing.   Cardiovascular: Positive for chest pain.  Gastrointestinal: Negative.   Genitourinary: Negative.   Musculoskeletal: Positive for myalgias.  Skin: Negative.   Neurological: Negative.   Hematological: Negative.   Psychiatric/Behavioral: Negative.     Allergies  Penicillins  Home Medications   Current Outpatient Rx  Name Route Sig Dispense Refill  . ASPIRIN 81 MG PO TABS Oral Take 81 mg by mouth daily.      Marland Kitchen BACLOFEN 10 MG PO TABS Oral Take 10 mg by mouth every 8 (eight) hours as needed. For spasticity    . CETIRIZINE HCL 10 MG PO CAPS Oral Take 10 mg by mouth daily.    Marland Kitchen CITALOPRAM HYDROBROMIDE 20 MG PO TABS Oral Take 20 mg by mouth daily.      Marland Kitchen CLOPIDOGREL BISULFATE 75 MG PO TABS Oral Take 75 mg by mouth daily.    . ERGOCALCIFEROL 50000 UNITS PO CAPS Oral Take 50,000 Units by  mouth once a week. fridays    . METOPROLOL SUCCINATE ER 25 MG PO TB24 Oral Take 12.5 mg by mouth daily.    Marland Kitchen NITROGLYCERIN 0.4 MG SL SUBL Sublingual Place 0.4 mg under the tongue every 5 (five) minutes x 3 doses as needed. For chest pain    . PANTOPRAZOLE SODIUM 40 MG PO TBEC Oral Take 40 mg by mouth daily.    Marland Kitchen PRAVASTATIN SODIUM 40 MG PO TABS Oral Take 40 mg by mouth daily.    . TRAMADOL HCL 50 MG PO TABS Oral Take 50 mg by mouth every 8 (eight) hours as needed. For pain    . TRAZODONE HCL 100 MG PO TABS Oral Take 100 mg by mouth at bedtime.      Marland Kitchen ZOLPIDEM TARTRATE 10 MG PO TABS Oral Take 10 mg by mouth at bedtime as needed. For insomnia      BP 138/87  Pulse 67  Temp(Src) 98.6 F (37 C) (Oral)  Resp 18  SpO2 95%  LMP 12/22/2007  Physical Exam  Constitutional: She is oriented to person, place, and time. She appears well-developed and well-nourished.  HENT:  Head: Normocephalic and atraumatic.  Eyes: Conjunctivae are normal.  Neck: Neck supple.  Cardiovascular: Normal rate and regular rhythm.   Pulmonary/Chest: Effort normal and breath sounds normal.       BBS somewhat diminished bilaterally with scattered rhonchi in the right upper lobe.  Abdominal: Soft. Bowel sounds are normal.  Musculoskeletal: Normal range of motion.  Neurological: She is alert and oriented to person, place, and time.  Skin: Skin is warm and dry. No erythema.  Psychiatric: She has a normal mood and affect.    ED Course  Procedures   Patient reports feeling better after albuterol neb. BBS much improved. Patient has received IV Rocephin. Findings and clinical impression discussed with patient. We will plan for discharge home with Z-Pak, albuterol inhaler and medication for cough and encourage close followup with her primary care physician. Patient agreeable with plan.     Labs Reviewed  CBC  DIFFERENTIAL  CK TOTAL AND CKMB  BASIC METABOLIC PANEL   Dg Chest 2 View  12/24/2011  *RADIOLOGY REPORT*   Clinical Data: Fever and cough.  Nausea and vomiting.  CHEST - 2 VIEW  Comparison: 10/05/2011  Findings: There is a vague area of density in the right upper lobe consistent with a tiny focal infiltrate or atelectasis.  This is new since the  prior study.  Heart size and vascularity are normal.  No effusions. Thoracolumbar scoliosis.  IMPRESSION: Small patchy area of infiltrate or atelectasis in the right upper lobe.  Original Report Authenticated By: Gwynn Burly, M.D.   Results for orders placed during the hospital encounter of 12/24/11  CBC      Component Value Range   WBC 6.8  4.0 - 10.5 (K/uL)   RBC 5.11  3.87 - 5.11 (MIL/uL)   Hemoglobin 15.5 (*) 12.0 - 15.0 (g/dL)   HCT 56.2  13.0 - 86.5 (%)   MCV 88.8  78.0 - 100.0 (fL)   MCH 30.3  26.0 - 34.0 (pg)   MCHC 34.1  30.0 - 36.0 (g/dL)   RDW 78.4  69.6 - 29.5 (%)   Platelets 141 (*) 150 - 400 (K/uL)  DIFFERENTIAL      Component Value Range   Neutrophils Relative 45  43 - 77 (%)   Lymphocytes Relative 45  12 - 46 (%)   Monocytes Relative 9  3 - 12 (%)   Eosinophils Relative 1  0 - 5 (%)   Basophils Relative 0  0 - 1 (%)   Neutro Abs 3.0  1.7 - 7.7 (K/uL)   Lymphs Abs 3.1  0.7 - 4.0 (K/uL)   Monocytes Absolute 0.6  0.1 - 1.0 (K/uL)   Eosinophils Absolute 0.1  0.0 - 0.7 (K/uL)   Basophils Absolute 0.0  0.0 - 0.1 (K/uL)   Smear Review LARGE PLATELETS PRESENT    CK TOTAL AND CKMB      Component Value Range   Total CK 562 (*) 7 - 177 (U/L)   CK, MB 3.3  0.3 - 4.0 (ng/mL)   Relative Index 0.6  0.0 - 2.5   BASIC METABOLIC PANEL      Component Value Range   Sodium 137  135 - 145 (mEq/L)   Potassium 3.3 (*) 3.5 - 5.1 (mEq/L)   Chloride 100  96 - 112 (mEq/L)   CO2 25  19 - 32 (mEq/L)   Glucose, Bld 113 (*) 70 - 99 (mg/dL)   BUN 9  6 - 23 (mg/dL)   Creatinine, Ser 2.84  0.50 - 1.10 (mg/dL)   Calcium 9.3  8.4 - 13.2 (mg/dL)   GFR calc non Af Amer >90  >90 (mL/min)   GFR calc Af Amer >90  >90 (mL/min)  POCT I-STAT TROPONIN I       Component Value Range   Troponin i, poc 0.01  0.00 - 0.08 (ng/mL)   Comment 3            Dg Chest 2 View  12/24/2011  *RADIOLOGY REPORT*  Clinical Data: Fever and cough.  Nausea and vomiting.  CHEST - 2 VIEW  Comparison: 10/05/2011  Findings: There is a vague area of density in the right upper lobe consistent with a tiny focal infiltrate or atelectasis.  This is new since the prior study.  Heart size and vascularity are normal.  No effusions. Thoracolumbar scoliosis.  IMPRESSION: Small patchy area of infiltrate or atelectasis in the right upper lobe.  Original Report Authenticated By: Gwynn Burly, M.D.      No diagnosis found.    MDM  HPI/PE and cliniccal findings c/w 1. Community acquired pna (No resp distress, 02 sats 97% on R/A, PORT Score (47), Pt has good f/u.   Medical screening examination/treatment/procedure(s) were performed by non-physician practitioner and as supervising physician I was immediately available for consultation/collaboration. Carleene Cooper  III, M.D.      Roma Kayser Schorr, NP 12/24/11 2255  Carleene Cooper III, MD 12/26/11 [phone]

## 2011-12-24 NOTE — Discharge Instructions (Signed)
Please review the instructions below. A chest x-ray tonight shows that you have a pneumonia. This is likely been the source of your fever and persistent cough. You've been treated here with one antibiotic and you continue azithromycin at home for a week. Usual cough medicine as directed when you needed. Use inhaler, 2 inhalations every 46 hours as needed for cough, shortness of breath and/or wheezing. Call your primary care physician at [facility] tomorrow to see if they want to see you sooner than your 01/09/2012 appointment. Return if you have worsening symptoms before that time.    Pneumonia, Adult Pneumonia is an infection of the lungs. It may be caused by a germ (virus or bacteria). Some types of pneumonia can spread easily from person to person. This can happen when you cough or sneeze. HOME CARE  Only take medicine as told by your doctor.   Take your medicine (antibiotics) as told. Finish it even if you start to feel better.   Do not smoke.   You may use a vaporizer or humidifier in your room. This can help loosen thick spit (mucus).   Sleep so you are almost sitting up (semi-upright). This helps reduce coughing.   Rest.  A shot (vaccine) can help prevent pneumonia. Shots are often advised for:  People over 60 years old.   Patients on chemotherapy.   People with long-term (chronic) lung problems.   People with immune system problems.  GET HELP RIGHT AWAY IF:   You are getting worse.   You cannot control your cough, and you are losing sleep.   You cough up blood.   Your pain gets worse, even with medicine.   You have a fever.   Any of your problems are getting worse, not better.   You have shortness of breath or chest pain.  MAKE SURE YOU:   Understand these instructions.   Will watch your condition.   Will get help right away if you are not doing well or get worse.  Document Released: 03/13/2008 Document Revised: 09/14/2011 Document Reviewed:  12/16/2010 [facility] Patient Information 2012 [place], [place].

## 2011-12-27 ENCOUNTER — Telehealth: Payer: Self-pay | Admitting: *Deleted

## 2011-12-27 NOTE — Telephone Encounter (Signed)
Thank you for arranging this. Please let me know if I can help with anything.  Johnette Abraham, D.O.

## 2011-12-27 NOTE — Telephone Encounter (Signed)
Pt calls and states she needs f/u appt from 3/17 for ED visit, diag pneumonia, is taking meds as directed, denies fevers. appt 0845 3/26 dr mills

## 2012-01-02 ENCOUNTER — Ambulatory Visit (INDEPENDENT_AMBULATORY_CARE_PROVIDER_SITE_OTHER): Payer: Medicare Other | Admitting: Internal Medicine

## 2012-01-02 ENCOUNTER — Encounter: Payer: Self-pay | Admitting: Internal Medicine

## 2012-01-02 VITALS — BP 111/76 | HR 52 | Temp 96.6°F | Ht 65.0 in | Wt 182.8 lb

## 2012-01-02 DIAGNOSIS — J189 Pneumonia, unspecified organism: Secondary | ICD-10-CM

## 2012-01-02 DIAGNOSIS — Z720 Tobacco use: Secondary | ICD-10-CM

## 2012-01-02 DIAGNOSIS — R7309 Other abnormal glucose: Secondary | ICD-10-CM

## 2012-01-02 DIAGNOSIS — R7303 Prediabetes: Secondary | ICD-10-CM

## 2012-01-02 DIAGNOSIS — F17200 Nicotine dependence, unspecified, uncomplicated: Secondary | ICD-10-CM

## 2012-01-02 DIAGNOSIS — L259 Unspecified contact dermatitis, unspecified cause: Secondary | ICD-10-CM

## 2012-01-02 DIAGNOSIS — E785 Hyperlipidemia, unspecified: Secondary | ICD-10-CM

## 2012-01-02 DIAGNOSIS — L309 Dermatitis, unspecified: Secondary | ICD-10-CM

## 2012-01-02 LAB — LIPID PANEL
Cholesterol: 167 mg/dL (ref 0–200)
HDL: 46 mg/dL (ref >39)
Total CHOL/HDL Ratio: 3.6 Ratio
VLDL: 14 mg/dL (ref 0–40)

## 2012-01-02 LAB — COMPREHENSIVE METABOLIC PANEL
ALT: 21 U/L (ref 0–35)
AST: 19 U/L (ref 0–37)
BUN: 14 mg/dL (ref 6–23)
Calcium: 9.7 mg/dL (ref 8.4–10.5)
Chloride: 109 mEq/L (ref 96–112)
Creat: 0.7 mg/dL (ref 0.50–1.10)
Total Bilirubin: 0.3 mg/dL (ref 0.3–1.2)

## 2012-01-02 LAB — POCT GLYCOSYLATED HEMOGLOBIN (HGB A1C): Hemoglobin A1C: 6

## 2012-01-02 MED ORDER — HYDROCORTISONE 2.5 % EX LOTN: TOPICAL_LOTION | CUTANEOUS | Status: DC

## 2012-01-02 MED ORDER — BENZONATATE 100 MG PO CAPS: 100.0000 mg | ORAL_CAPSULE | Freq: Three times a day (TID) | ORAL | Status: AC | PRN

## 2012-01-02 NOTE — Patient Instructions (Signed)
Schedule followup appointment with Dr. Saralyn Pilar in May. You will need to come back for a chest x-ray by the end of May .  This is important! Hydrocortisone is a prescription cream for  eczema. Applied only to the patient areas of rash and not to your whole body. Continue to use a regular lotion for your dry skin. Tesslon Perles is a pill to help with cough. Use as directed. If you develop fever, shaking chills, worsening cough, difficulty breathing, chest pain, or other concerning symptoms, come back to the clinic sooner or go to the emergency room. I will call you if any of upir lab work is abnormal

## 2012-01-04 DIAGNOSIS — Z720 Tobacco use: Secondary | ICD-10-CM | POA: Insufficient documentation

## 2012-01-04 DIAGNOSIS — J189 Pneumonia, unspecified organism: Secondary | ICD-10-CM | POA: Insufficient documentation

## 2012-01-04 NOTE — Assessment & Plan Note (Signed)
Patient's continued smoking is likely exacerbating substituted to her persistent cough. Encouraged patient to continue her efforts to quit. We'll followup on this with her next office visit.

## 2012-01-04 NOTE — Assessment & Plan Note (Signed)
Patient states she remains very concerned about development of diabetes. She denies polyuria and polydipsia. Will repeat hemoglobin A1c today.

## 2012-01-04 NOTE — Assessment & Plan Note (Signed)
Will check lipid profile and liver function tests today.

## 2012-01-04 NOTE — Progress Notes (Signed)
Subjective:     Patient ID: [name], female   DOB: 24-Aug-1966, 46 y.o.   MRN: [id number]  HPI  patient is a 46 year old female presenting for hospital followup. She was recently diagnosed with a right sided pneumonia and treated with a course of azithromycin. She notes she is feeling better. She states she is no longer febrile but she continues to have a persistent cough that is occasionally productive of yellow sputum.  Her cough was well controlled with prescription cough syrup; she states she is now out of this and this interested in continuing with the medication to treat her cough. She denies hemoptysis, shortness of breath, chest pain, syncope, nausea, vomiting, diarrhea, or other complaint.  She states she has a follow up with her primary care provider for routine lab including lipid panel; she is interested in doing this today and deferring her follow up with PCP.  She has no other questions or concerns today.  Review of Systems Constitutional: Negative for fever, chills, diaphoresis, activity change, appetite change, fatigue and unexpected weight change.  HENT: Negative for hearing loss, congestion and neck stiffness.   Eyes: Negative for photophobia, pain and visual disturbance.  Respiratory: Negative for cough, chest tightness, shortness of breath and wheezing.   Cardiovascular: Negative for chest pain and palpitations.  Gastrointestinal: Negative for abdominal pain, blood in stool and anal bleeding.  Genitourinary: Negative for dysuria, hematuria and difficulty urinating.  Musculoskeletal: Negative for joint swelling.  Neurological: Negative for dizziness, syncope, speech difficulty, weakness, numbness and headaches.       Objective:   Physical Exam VItal signs reviewed  GEN: No apparent distress.  Alert and oriented x 3.  Pleasant, conversant, and cooperative to exam. HEENT: head is autraumatic and normocephalic.  Neck is supple without palpable masses or  lymphadenopathy.  No JVD or carotid bruits.  Vision intact.  EOMI.  PERRLA.  Sclerae anicteric.  Conjunctivae without pallor or injection. Mucous membranes are moist.  Oropharynx is without erythema, exudates, or other abnormal lesions.   RESP:  Lungs are clear to ascultation bilaterally with good air movement.  No wheezes, ronchi, or rubs. CARDIOVASCULAR: regular rate, normal rhythm.  Clear S1, S2, no murmurs, gallops, or rubs. ABDOMEN: soft, non-tender, non-distended.  Bowels sounds present in all quadrants and normoactive.  No palpable masses.    Assessment/Plan:

## 2012-01-04 NOTE — Assessment & Plan Note (Signed)
Patient is recovering well from her pneumonia although she continues to experience a cough. Advised patient that this cough may persist for weeks to months.  Will prescribe Tessalon Perles for treatment of cough. Patient is advised to return to the clinic or go to the emergency room should she develop fever, shaking chills, worsening cough, shortness of breath, chest pain, hemoptysis or other concerning symptoms.    Given her age and smoking status, she will need a repeat chest x-ray in 7-12 weeks to ensure resolution of a normal finding on chest x-ray. Discussed this at length with patient, she is agreeable to followup chest x-ray. Will order this today to ensure this gets scheduled and increase likelihood that this will be completed.

## 2012-01-08 ENCOUNTER — Ambulatory Visit: Payer: Medicare Other | Admitting: *Deleted

## 2012-01-09 ENCOUNTER — Encounter: Payer: Medicare Other | Admitting: Internal Medicine

## 2012-02-09 ENCOUNTER — Ambulatory Visit (HOSPITAL_COMMUNITY)
Admission: RE | Admit: 2012-02-09 | Discharge: 2012-02-09 | Disposition: A | Discharge status: Home / Self Care | Payer: Medicare Other | Source: Ambulatory Visit | Attending: Internal Medicine | Admitting: Internal Medicine

## 2012-02-09 DIAGNOSIS — J189 Pneumonia, unspecified organism: Secondary | ICD-10-CM | POA: Insufficient documentation

## 2012-02-09 DIAGNOSIS — Z09 Encounter for follow-up examination after completed treatment for conditions other than malignant neoplasm: Secondary | ICD-10-CM | POA: Insufficient documentation

## 2012-02-09 DIAGNOSIS — Z720 Tobacco use: Secondary | ICD-10-CM

## 2012-02-15 ENCOUNTER — Other Ambulatory Visit: Payer: Self-pay | Admitting: Internal Medicine

## 2012-02-15 ENCOUNTER — Encounter: Payer: Self-pay | Admitting: Internal Medicine

## 2012-02-15 ENCOUNTER — Ambulatory Visit (INDEPENDENT_AMBULATORY_CARE_PROVIDER_SITE_OTHER): Payer: Medicare Other | Admitting: Internal Medicine

## 2012-02-15 VITALS — BP 116/78 | HR 58 | Temp 97.7°F | Ht 65.0 in | Wt 180.3 lb

## 2012-02-15 DIAGNOSIS — G4700 Insomnia, unspecified: Secondary | ICD-10-CM

## 2012-02-15 DIAGNOSIS — G809 Cerebral palsy, unspecified: Secondary | ICD-10-CM

## 2012-02-15 DIAGNOSIS — F17200 Nicotine dependence, unspecified, uncomplicated: Secondary | ICD-10-CM

## 2012-02-15 DIAGNOSIS — Z0000 Encounter for general adult medical examination without abnormal findings: Secondary | ICD-10-CM

## 2012-02-15 DIAGNOSIS — Z720 Tobacco use: Secondary | ICD-10-CM

## 2012-02-15 DIAGNOSIS — Z23 Encounter for immunization: Secondary | ICD-10-CM

## 2012-02-15 DIAGNOSIS — E785 Hyperlipidemia, unspecified: Secondary | ICD-10-CM

## 2012-02-15 DIAGNOSIS — I10 Essential (primary) hypertension: Secondary | ICD-10-CM

## 2012-02-15 DIAGNOSIS — Z1231 Encounter for screening mammogram for malignant neoplasm of breast: Secondary | ICD-10-CM

## 2012-02-15 DIAGNOSIS — E559 Vitamin D deficiency, unspecified: Secondary | ICD-10-CM

## 2012-02-15 MED ORDER — IBUPROFEN 200 MG PO TABS: 200.0000 mg | ORAL_TABLET | Freq: Four times a day (QID) | ORAL | Status: DC | PRN

## 2012-02-15 NOTE — Progress Notes (Signed)
Subjective:    Patient ID: [name] female   DOB: May 28, 1966 46 y.o.   MRN: [id number]  HPI: [name] is a 46 y.o. with a PMHx of CP, CAD s/p DES in 05/2011 and prior in 02/2008, prediabetes, hypertension, tobacco abuse who presented to clinic today for the following:  1) HTN - Patient does not check blood pressure regularly at home. Currently taking Toprol XL 12.5mg  . Patient misses doses 0 x per week on average. denies headaches, dizziness, lightheadedness, chest pain, shortness of breath.  does request refills today.  2) Spasticity - patient indicates that recently, she is having increased spasticity from her cerebral palsy, involving her legs, Rt > Lt, with occasional swelling of the right knee when   3) HLD - currently taking Pravastatin. Patient misses doses 0 x per week on average. denies chest pain, difficulty breathing, palpitations, tachycardia, and muscle pains. does request refills today.   4) Insomnia - patient states she has ongoing issue with insomnia despite medications. Sometimes takes a few hours before she can fall asleep even if she takes her medications. Drinks up to 10 cups of caffeine daily.    Review of Systems: Per HPI.  Current Outpatient Medications: Medication Sig  . aspirin 81 MG tablet Take 81 mg by mouth daily.    . baclofen (LIORESAL) 10 MG tablet Take 10 mg by mouth every 8 (eight) hours as needed. For spasticity  . Cetirizine HCl 10 MG CAPS Take 10 mg by mouth daily.  . citalopram (CELEXA) 20 MG tablet Take 20 mg by mouth daily.    . clopidogrel (PLAVIX) 75 MG tablet Take 75 mg by mouth daily.  . hydrocortisone 2.5 % lotion Apply to affected area 2 times daily  . metoprolol succinate (TOPROL-XL) 25 MG 24 hr tablet Take 12.5 mg by mouth daily.  . nitroGLYCERIN (NITROSTAT) 0.4 MG SL tablet Place 0.4 mg under the tongue every 5 (five) minutes x 3 doses as needed. For chest pain  . pantoprazole (PROTONIX) 40 MG tablet Take 40 mg by mouth  daily.  . pravastatin (PRAVACHOL) 40 MG tablet Take 40 mg by mouth daily.  . traMADol (ULTRAM) 50 MG tablet Take 50 mg by mouth every 8 (eight) hours as needed. For pain  . traZODone (DESYREL) 100 MG tablet Take 100 mg by mouth at bedtime.    Marland Kitchen zolpidem (AMBIEN) 10 MG tablet Take 10 mg by mouth at bedtime as needed. For insomnia    Allergies  Allergen Reactions  . Penicillins Hives    Past Medical History  Diagnosis Date  . Coronary artery disease 02/2008    a. s/p INF STEMI 5/09 tx with BMS to CFX;  b. Lexiscan Myoview 12/10 -  low risk, no ischemia.;   c. s/p NSTEMI 8/12: cath with LM 10%, LAD lum. irregs., oOM1 40%, mOM2 40%, mCFX stent occluded, RCA 50%, EF 45-50%; PCI - s/p DES to mCFX  d. Patent LCx stent, diffuse disease up to 50% in small nondominant RCA, 80% ostial stenosis in small OM3.   . Bipolar disorder      Followed by mental health, who prescribe her bipolar meds  . Cerebral palsy      with history of neuropathic pain  and spasticity,  requiring long-term pain medications  . Hypertension   . GERD (gastroesophageal reflux disease)   . Vitamin d deficiency 11/2009     patient treated with high-dose vitamin D weekly x3 months,  levels were rechecked and it  was determined that the patient continued to require high dose repletion of vitamin D.  . History of pelvic mass     Found on CT abd/ pelvis (04/2008) - 13.3 cm cystic pelvis mass, possibly left ovarian origin. To be followed at [facility].   Marland Kitchen HLD (hyperlipidemia)     Past Surgical History  Procedure Date  . Total abdominal hysterectomy 03/2003     secondary to symptomatic fibroids.  . Cesarean section     x2  . Tubal ligation   .  elective abortion     x2  . Other surgical history      heel surgery, bilateral  . Stens      Objective:    Physical Exam: BP 116/78  Pulse 58  Temp(Src) 97.7 F (36.5 C) (Oral)  Ht 5\' 5"  (1.651 m)  Wt 180 lb 4.8 oz (81.784 kg)  BMI 30.00 kg/m2  LMP 12/22/2007   General:  Vital signs reviewed and noted. Well-developed, well-nourished, in no acute distress; alert, appropriate and cooperative throughout examination.  Neck: No deformities, masses, or tenderness noted. No lymphadenopathy.  Lungs:  Normal respiratory effort. Clear to auscultation BL without crackles or wheezes.  Heart: RRR. S1 and S2 normal without gallop, murmur, or rubs.  Abdomen:  BS normoactive. Soft, Nondistended, non-tender.  No masses or organomegaly.  Extremities: No pretibial edema. Normal range of motion, no right knee effusion, redness, or warmth identified.     Assessment/ Plan:   Case and plan of care discussed with Dr. Doneen Poisson.

## 2012-02-15 NOTE — Patient Instructions (Addendum)
Please follow-up at the clinic in 3-6 months, at which time we will reevaluate your blood pressure, spasms, insomnia - OR, please follow-up in the clinic sooner if needed.  There have been changes in your medications:  STOP baclofen  START ibuprofen (1-2 tabs every 6 hours as needed - over the counter) as needed for your spasms - take with food as needed. Take as little as needed to stop your pain.   You are getting labs today, if they are abnormal, I will call you.  If you have been started on new medication(s), and you develop symptoms concerning for allergic reaction, including, but not limited to, throat closing, tongue swelling, rash, please stop the medication immediately and call the clinic at [phone], and go to the ER.  If symptoms worsen, or new symptoms arise, please call the clinic or go to the ER.  Please bring all of your medications in a bag to your next visit.   Smoking Cessation Tips 1-800-QUIT-NOW  This document explains the best ways for you to quit smoking and new treatments to help. It lists new medicines that can double or triple your chances of quitting and quitting for good. It also considers ways to avoid relapses and concerns you may have about quitting, including weight gain.   NICOTINE: A POWERFUL ADDICTION:  If you have tried to quit smoking, you know how hard it can be. It is hard because nicotine is a very addictive drug. For some people, it can be as addictive as heroin or cocaine. Usually, people make 2 or 3 tries, or more, before finally being able to quit. Each time you try to quit, you can learn about what helps and what hurts. Quitting takes hard work and a lot of effort, but you can quit smoking.   QUITTING SMOKING IS ONE OF THE MOST IMPORTANT THINGS YOU WILL EVER DO:  You will live longer, feel better, and live better.  The impact on your body of quitting smoking is felt almost immediately:  Within 20 minutes, blood pressure decreases. Pulse  returns to its normal level.  After 8 hours, carbon monoxide levels in the blood return to normal. Oxygen level increases.  After 24 hours, chance of heart attack starts to decrease. Breath, hair, and body stop smelling like smoke.  After 48 hours, damaged nerve endings begin to recover. Sense of taste and smell improve.  After 72 hours, the body is virtually free of nicotine. Bronchial tubes relax and breathing becomes easier.  After 2 to 12 weeks, lungs can hold more air. Exercise becomes easier and circulation improves.  Quitting will lower your chance of having a heart attack, stroke, cancer, or lung disease:  After 1 year, the risk of coronary heart disease is cut in half.  After 5 years, the risk of stroke falls to the same as a nonsmoker.  After 10 years, the risk of lung cancer is cut in half and the risk of other cancers decreases significantly.  After 15 years, the risk of coronary heart disease drops, usually to the level of a nonsmoker.  If you are pregnant, quitting smoking will improve your chances of having a healthy baby.  The people you live with, especially your children, will be healthier.  You will have extra money to spend on things other than cigarettes.   FIVE KEYS TO QUITTING:  Studies have shown that these 5 steps will help you quit smoking and quit for good. You have the best chances of  quitting if you use them together:   1. GET READY  Set a quit date.  Change your environment.  Get rid of ALL cigarettes, ashtrays, matches, and lighters in your home, car, and place of work.  Do not let people smoke in your home.  Review your past attempts to quit. Think about what worked and what did not.  Once you quit, do not smoke. NOT EVEN A PUFF!   2. GET SUPPORT AND ENCOURAGEMENT  Studies have shown that you have a better chance of being successful if you have help. You can get support in many ways. Tell your family, friends, and coworkers that you are going to quit and  need their support. Ask them not to smoke around you.  Talk to your caregivers (doctor, dentist, nurse, pharmacist, psychologist, and/or smoking counselor).  Get individual, group, or telephone counseling and support. The more counseling you have, the better your chances are of quitting. Programs are available at [employer] and health centers. Call your local health department for information about programs in your area.  Spiritual beliefs and practices may help some smokers quit.  Quit meters are Photographer that keep track of quit statistics, such as amount of "quit-time," cigarettes not smoked, and money saved.  Many smokers find one or more of the many self-help books available useful in helping them quit and stay off tobacco.   3. LEARN NEW SKILLS AND BEHAVIORS  Try to distract yourself from urges to smoke. Talk to someone, go for a walk, or occupy your time with a task.  When you first try to quit, change your routine. Take a different route to work. Drink tea instead of coffee. Eat breakfast in a different place.  Do something to reduce your stress. Take a hot bath, exercise, or read a book.  Plan something enjoyable to do every day. Reward yourself for not smoking.  Explore interactive web-based programs that specialize in helping you quit.   4. GET MEDICINE AND USE IT CORRECTLY .  Medicines can help you stop smoking and decrease the urge to smoke. Combining medicine with the above behavioral methods and support can quadruple your chances of successfully quitting smoking.  Talk with your doctor about these options.  5. BE PREPARED FOR RELAPSE OR DIFFICULT SITUATIONS  Most relapses occur within the first 3 months after quitting. Do not be discouraged if you start smoking again. Remember, most people try several times before they finally quit.  You may have symptoms of withdrawal because your body is used to nicotine. You may crave cigarettes, be  irritable, feel very hungry, cough often, get headaches, or have difficulty concentrating.  The withdrawal symptoms are only temporary. They are strongest when you first quit, but they will go away within 10 to 14 days.    Here are some difficult situations to watch for:  Alcohol. Avoid drinking alcohol. Drinking lowers your chances of successfully quitting.  Caffeine. Try to reduce the amount of caffeine you consume. It also lowers your chances of successfully quitting.  Other smokers. Being around smoking can make you want to smoke. Avoid smokers.  Weight gain. Many smokers will gain weight when they quit, usually less than 10 pounds. Eat a healthy diet and stay active. Do not let weight gain distract you from your main goal, quitting smoking. Some medicines that help you quit smoking may also help delay weight gain. You can always lose the weight gained after you quit.  Bad  mood or depression. There are a lot of ways to improve your mood other than smoking.   If you are having problems with any of these situations, talk to your caregiver.   SPECIAL SITUATIONS OR CONDITIONS:  Studies suggest that everyone can quit smoking. Your situation or condition can give you a special reason to quit. Pregnant women/New mothers: By quitting, you protect your baby's health and your own.  Hospitalized patients: By quitting, you reduce health problems and help healing.  Heart attack patients: By quitting, you reduce your risk of a second heart attack.  Lung, head, and neck cancer patients: By quitting, you reduce your chance of a second cancer.  Parents of children and adolescents: By quitting, you protect your children from illnesses caused by secondhand smoke.   QUESTIONS TO THINK ABOUT: Think about the following questions before you try to stop smoking. You may want to talk about your answers with your caregiver.  Why do you want to quit?  If you tried to quit in the past, what helped and what did not?    What will be the most difficult situations for you after you quit? How will you plan to handle them?  Who can help you through the tough times? Your family? Friends? Caregiver?  What pleasures do you get from smoking? What ways can you still get pleasure if you quit?   Here are some questions to ask your caregiver:  How can you help me to be successful at quitting?  What medicine do you think would be best for me and how should I take it?  What should I do if I need more help?  What is smoking withdrawal like? How can I get information on withdrawal?   Quitting takes hard work and a lot of effort, but you can quit smoking.   FOR MORE INFORMATION  Smokefree.gov (http://www.davis-sullivan.com/) provides free, accurate, evidence-based information and professional assistance to help support the immediate and long-term needs of people trying to quit smoking.  Document Released: 09/19/2001 Document Re-Released: 03/15/2010  [facility] Patient Information 2011 [place], [place].   Insomnia Insomnia is frequent trouble falling and/or staying asleep. Insomnia can be a long term problem or a short term problem. Both are common. Insomnia can be a short term problem when the wakefulness is related to a certain stress or worry. Long term insomnia is often related to ongoing stress during waking hours and/or poor sleeping habits. Overtime, sleep deprivation itself can make the problem worse. Every little thing feels more severe because you are overtired and your ability to cope is decreased. CAUSES    Stress, anxiety, and depression.   Poor sleeping habits.   Distractions such as TV in the bedroom.   Naps close to bedtime.   Engaging in emotionally charged conversations before bed.   Technical reading before sleep.   Alcohol and other sedatives. They may make the problem worse. They can hurt normal sleep patterns and normal dream activity.   Stimulants such as caffeine for several hours prior to  bedtime.   Pain syndromes and shortness of breath can cause insomnia.   Exercise late at night.   Changing time zones may cause sleeping problems (jet lag).  It is sometimes helpful to have someone observe your sleeping patterns. They should look for periods of not breathing during the night (sleep apnea). They should also look to see how long those periods last. If you live alone or observers are uncertain, you can also be observed at a  sleep clinic where your sleep patterns will be professionally monitored. Sleep apnea requires a checkup and treatment. Give your caregivers your medical history. Give your caregivers observations your family has made about your sleep.   SYMPTOMS    Not feeling rested in the morning.   Anxiety and restlessness at bedtime.   Difficulty falling and staying asleep.  TREATMENT    Your caregiver may prescribe treatment for an underlying medical disorders. Your caregiver can give advice or help if you are using alcohol or other drugs for self-medication. Treatment of underlying problems will usually eliminate insomnia problems.   Medications can be prescribed for short time use. They are generally not recommended for lengthy use.   Over-the-counter sleep medicines are not recommended for lengthy use. They can be habit forming.   You can promote easier sleeping by making lifestyle changes such as:   Using relaxation techniques that help with breathing and reduce muscle tension.   Exercising earlier in the day.   Changing your diet and the time of your last meal. No night time snacks.   Establish a regular time to go to bed.   Counseling can help with stressful problems and worry.   Soothing music and white noise may be helpful if there are background noises you cannot remove.   Stop tedious detailed work at least one hour before bedtime.  HOME CARE INSTRUCTIONS    Keep a diary. Inform your caregiver about your progress. This includes any medication  side effects. See your caregiver regularly. Take note of:   Times when you are asleep.   Times when you are awake during the night.   The quality of your sleep.   How you feel the next day.  This information will help your caregiver care for you.  Get out of bed if you are still awake after 15 minutes. Read or do some quiet activity. Keep the lights down. Wait until you feel sleepy and go back to bed.   Keep regular sleeping and waking hours. Avoid naps.   Exercise regularly.   Avoid distractions at bedtime. Distractions include watching television or engaging in any intense or detailed activity like attempting to balance the household checkbook.   Develop a bedtime ritual. Keep a familiar routine of bathing, brushing your teeth, climbing into bed at the same time each night, listening to soothing music. Routines increase the success of falling to sleep faster.   Use relaxation techniques. This can be using breathing and muscle tension release routines. It can also include visualizing peaceful scenes. You can also help control troubling or intruding thoughts by keeping your mind occupied with boring or repetitive thoughts like the old concept of counting sheep. You can make it more creative like imagining planting one beautiful flower after another in your backyard garden.   During your day, work to eliminate stress. When this is not possible use some of the previous suggestions to help reduce the anxiety that accompanies stressful situations.  MAKE SURE YOU:    Understand these instructions.   Will watch your condition.   Will get help right away if you are not doing well or get worse.  Document Released: 09/22/2000 Document Revised: 09/14/2011 Document Reviewed: 10/23/2007 [facility] Patient Information 2012 [place], [place].

## 2012-02-16 LAB — LIPID PANEL
Cholesterol: 138 mg/dL (ref 0–200)
Total CHOL/HDL Ratio: 3.5 Ratio
VLDL: 14 mg/dL (ref 0–40)

## 2012-02-16 LAB — VITAMIN D 25 HYDROXY (VIT D DEFICIENCY, FRACTURES): Vit D, 25-Hydroxy: 33 ng/mL (ref 30–89)

## 2012-02-19 DIAGNOSIS — G4700 Insomnia, unspecified: Secondary | ICD-10-CM | POA: Insufficient documentation

## 2012-02-19 NOTE — Assessment & Plan Note (Addendum)
Labs:  Ref. Range 06/29/2011 14:30  Vit D, 25-Hydroxy Latest Range: 30-89 ng/mL 17 (L)    Assessment: Status: Pt has been prescribed high dose vitamin D supplementation in 06/2011, never got it filled, didn't understand why she had to be on high dose supplementation as opposed to over the counter. I restarted high dose vitamin D during 06/2011 visit x 8 weeks, which she completed.  Disease Control: not controlled  Progress toward goals: unable to assess  Barriers to meeting goals: no barriers identified   Plan:     Recheck 25-OH vitamin D levels today, provide supplementation as appropriate.   ADDENDUM TO ABOVE: 02/20/2012, 9:02 AM   Vitamin D level of 33, therefore, appropriately repleted and no need for additional supplementation. Johnette Abraham, D.O.

## 2012-02-19 NOTE — Assessment & Plan Note (Signed)
1. The patient was counseled on the dangers of tobacco use, and was advised to quit and referred to a tobacco cessation program.   2. Reviewed strategies to maximize success, including:  Removing cigarettes and smoking materials from environment  Stress management  Substitution of other forms of reinforcement Support of family/friends.  Selecting a quit date.  Patient provided contact information for 1-800-QUIT-NOW   

## 2012-02-19 NOTE — Assessment & Plan Note (Signed)
Assessment: Multiple things contributing such as excessive caffeine, not taking medications at regular timing.  Plan:      Continue current medications  Reviewed methods of improving sleep hygiene

## 2012-02-19 NOTE — Assessment & Plan Note (Signed)
Pertinent Data: BP Readings from Last 3 Encounters:  02/15/12 116/78  01/02/12 111/76  12/24/11 108/62    Basic Metabolic Panel:    Component Value Date/Time   NA 141 01/02/2012 0944   K 4.6 01/02/2012 0944   CL 109 01/02/2012 0944   CO2 24 01/02/2012 0944   BUN 14 01/02/2012 0944   CREATININE 0.70 01/02/2012 0944   CREATININE 0.62 12/24/2011 2141   GLUCOSE 97 01/02/2012 0944   CALCIUM 9.7 01/02/2012 0944    Assessment: Disease Control: controlled  Progress toward goals: at goal  Barriers to meeting goals: no barriers identified    Always has initially elevated blood pressures when she comes in, with repeat being within normal limits.    Patient is compliant all of the time with prescribed medications.   Plan:  continue current medications

## 2012-02-19 NOTE — Assessment & Plan Note (Addendum)
Pertinent Labs: Liver Function Tests:    Component Value Date/Time   AST 19 01/02/2012 0944   ALT 21 01/02/2012 0944   ALKPHOS 64 01/02/2012 0944   BILITOT 0.3 01/02/2012 0944   PROT 7.3 01/02/2012 0944   ALBUMIN 4.6 01/02/2012 0944    Lipid Panel:     Component Value Date/Time   CHOL 138 02/15/2012 1451   TRIG 70 02/15/2012 1451   HDL 40 02/15/2012 1451   CHOLHDL 3.5 02/15/2012 1451   VLDL 14 02/15/2012 1451   LDLCALC 84 02/15/2012 1451     Assessment: Disease Control: controlled  Progress toward goals: at goal  Barriers to meeting goals: no barriers identified     Patient is not fasting today.   Patient is compliant most of the time with prescribed medications.    Plan:  continue current medications  Check FLP today.    ADDENDUM TO ABOVE: 02/20/2012, 8:58 AM   Patient's LDL is still above goal of < 70 mg/dL, therefore, she will require escalation of current regimen. We will change to Simvastatin 40mg  daily. Patient called and informed of this result and need to change therapy. We also discussed lifestyle modification, and quitting smoking. Patient expresses understanding and will watch out for s/s of side effect or allergic reaction. Simvastatin to be sent to her pharmacy.  Will need repeat Lipid panel and CMET in 6-8 weeks.  Johnette Abraham, D.O.

## 2012-02-19 NOTE — Assessment & Plan Note (Signed)
Health Maintenance  Topic Date Due  . Influenza Vaccine  07/09/2012  . Tetanus/tdap  02/14/2022    Assessment:  Due for Tdap.  Plan:  Tdap today, patient explained that she may incur a bill for this, as she has Medicare, and they aren't covering unless acute injury. She understands and accepts responsibility.

## 2012-02-19 NOTE — Assessment & Plan Note (Addendum)
Assessment: Patient c/o increased spasticity. Does not feel that her baclofen works well, however, high dose ibuprofen does work well for her.  Plan:      Will dc baclofen  Will start PRN ibuprofen, can use over the counter first (400mg ), if not enough will escalate to higher dose ibuprofen (can call in for her)  Will alternatively consider low dose benzo if the above is ineffective.

## 2012-02-20 MED ORDER — SIMVASTATIN 40 MG PO TABS: 40.0000 mg | ORAL_TABLET | Freq: Every evening | ORAL | Status: DC

## 2012-02-20 NOTE — Progress Notes (Signed)
Quick Note:  Vitamin D levels have been appropriately repleted. She does not need additional therapy given levels are > 30. Johnette Abraham, D.O., 02/20/2012, 8:57 AM ______

## 2012-02-20 NOTE — Progress Notes (Signed)
Addended by: Priscella Mann on: 02/20/2012 09:08 AM   Modules accepted: Orders

## 2012-03-01 ENCOUNTER — Other Ambulatory Visit (HOSPITAL_COMMUNITY): Payer: Medicare Other

## 2012-03-14 ENCOUNTER — Ambulatory Visit (HOSPITAL_COMMUNITY): Payer: Medicare Other

## 2012-04-09 ENCOUNTER — Ambulatory Visit (HOSPITAL_COMMUNITY)
Admission: RE | Admit: 2012-04-09 | Discharge: 2012-04-09 | Disposition: A | Discharge status: Home / Self Care | Payer: Medicare Other | Source: Ambulatory Visit | Attending: Internal Medicine | Admitting: Internal Medicine

## 2012-04-09 DIAGNOSIS — Z1231 Encounter for screening mammogram for malignant neoplasm of breast: Secondary | ICD-10-CM | POA: Insufficient documentation

## 2012-05-02 ENCOUNTER — Ambulatory Visit: Payer: Medicare Other | Admitting: Cardiology

## 2012-06-04 ENCOUNTER — Other Ambulatory Visit: Payer: Self-pay | Admitting: *Deleted

## 2012-06-04 ENCOUNTER — Encounter: Payer: Self-pay | Admitting: Cardiology

## 2012-06-04 ENCOUNTER — Ambulatory Visit (INDEPENDENT_AMBULATORY_CARE_PROVIDER_SITE_OTHER): Payer: Medicare Other | Admitting: Cardiology

## 2012-06-04 VITALS — BP 115/70 | HR 57 | Wt 180.0 lb

## 2012-06-04 DIAGNOSIS — E785 Hyperlipidemia, unspecified: Secondary | ICD-10-CM

## 2012-06-04 DIAGNOSIS — I2510 Atherosclerotic heart disease of native coronary artery without angina pectoris: Secondary | ICD-10-CM

## 2012-06-04 MED ORDER — SIMVASTATIN 40 MG PO TABS: 40.0000 mg | ORAL_TABLET | Freq: Every evening | ORAL | Status: DC

## 2012-06-04 MED ORDER — METOPROLOL SUCCINATE ER 25 MG PO TB24: 12.5000 mg | ORAL_TABLET | Freq: Every day | ORAL | Status: DC

## 2012-06-04 MED ORDER — NITROGLYCERIN 0.4 MG SL SUBL: 0.4000 mg | SUBLINGUAL_TABLET | SUBLINGUAL | Status: DC | PRN

## 2012-06-04 NOTE — Patient Instructions (Addendum)
Your physician wants you to follow-up in: ONE YEAR WITH DR CRENSHAW You will receive a reminder letter in the mail two months in advance. If you don't receive a letter, please call our office to schedule the follow-up appointment.   STOP PLAVIX 

## 2012-06-04 NOTE — Assessment & Plan Note (Signed)
Continue statin. 

## 2012-06-04 NOTE — Assessment & Plan Note (Signed)
Blood pressure controlled. Continue present medications. 

## 2012-06-04 NOTE — Assessment & Plan Note (Signed)
Patient counseled on discontinuing. 

## 2012-06-04 NOTE — Assessment & Plan Note (Signed)
Continue aspirin and statin. Discontinue Plavix. 

## 2012-06-04 NOTE — Progress Notes (Signed)
HPI: Pleasant female for FU of CAD. She has a history of CAD, status post bare-metal stent to the circumflex in 2009 in the setting of inferior STEMI, hypertension and hyperlipidemia as well as cerebral palsy and bipolar disorder.  She was admitted in August of 2012 and ruled in for NSTEMI. She underwent cardiac catheterization 06/03/11: LM 10%, LAD lum. irregs., OM1 40%, OM2 40%, mCFX stent occluded, RCA 50%, EF 45-50%. She underwent PCI with placement of a DES to mCFX 2/2 in-stent restenosis.  Echo 9/12 showed EF 55-60, mild MR. Myoview in Oct 2012 for continuing chest pain showed EF 54 and breast attenuation. Repeat cath Dec 2012 for chest pain showed Left mainstem: No significant disease. Left anterior descending (LAD): 25% proximal, 25% mid LAD stenosis. Left circumflex (LCx): Large vessel supplying left-sided PDA. Moderate OM1 with 50% ostial stenosis. Moderate OM2 with 40% ostial stenosis and 30% proximal stenosis. Patent mid LCx stent. Small OM3 with 80% ostial stenosis. Left-sided PDA patent. Right coronary artery (RCA): Small nondominant vessel with diffuse up to 50% stenosis in the mid to distal vessel. EF 55 with basal inferior akinesis. I last saw her in Jan 2013. Since then, she denies dyspnea on exertion, pedal edema, chest pain or syncope.   Current Outpatient Prescriptions  Medication Sig Dispense Refill  . aspirin 81 MG tablet Take 81 mg by mouth daily.        . Cetirizine HCl 10 MG CAPS Take 10 mg by mouth daily.      . citalopram (CELEXA) 20 MG tablet Take 20 mg by mouth daily.        Marland Kitchen ibuprofen (ADVIL) 200 MG tablet Take 1-2 tablets (200-400 mg total) by mouth every 6 (six) hours as needed for pain (take with food, DO NOT EXCEED 2400 mg/day).  100 tablet  2  . metoprolol succinate (TOPROL-XL) 25 MG 24 hr tablet Take 12.5 mg by mouth daily.      . nitroGLYCERIN (NITROSTAT) 0.4 MG SL tablet Place 0.4 mg under the tongue every 5 (five) minutes x 3 doses as needed. For chest pain       . pantoprazole (PROTONIX) 40 MG tablet Take 40 mg by mouth daily.      . simvastatin (ZOCOR) 40 MG tablet Take 1 tablet (40 mg total) by mouth every evening.  30 tablet  2  . traMADol (ULTRAM) 50 MG tablet Take 50 mg by mouth every 8 (eight) hours as needed. For pain      . traZODone (DESYREL) 100 MG tablet Take 100 mg by mouth at bedtime.        . hydrocortisone 2.5 % lotion Apply to affected area 2 times daily  60 mL  1  . zolpidem (AMBIEN) 10 MG tablet Take 10 mg by mouth at bedtime as needed. For insomnia         Past Medical History  Diagnosis Date  . Coronary artery disease 02/2008    a. s/p INF STEMI 5/09 tx with BMS to CFX;  b. Lexiscan Myoview 12/10 -  low risk, no ischemia.;   c. s/p NSTEMI 8/12: cath with LM 10%, LAD lum. irregs., oOM1 40%, mOM2 40%, mCFX stent occluded, RCA 50%, EF 45-50%; PCI - s/p DES to mCFX  d. Patent LCx stent, diffuse disease up to 50% in small nondominant RCA, 80% ostial stenosis in small OM3.   . Bipolar disorder      Followed by mental health, who prescribe her bipolar meds  . Cerebral  palsy      with history of neuropathic pain  and spasticity,  requiring long-term pain medications  . Hypertension   . GERD (gastroesophageal reflux disease)   . Vitamin d deficiency 11/2009     patient treated with high-dose vitamin D weekly x3 months,  levels were rechecked and it was determined that the patient continued to require high dose repletion of vitamin D.  . History of pelvic mass     Found on CT abd/ pelvis (04/2008) - 13.3 cm cystic pelvis mass, possibly left ovarian origin. To be [facility] at [facility].   Marland Kitchen HLD (hyperlipidemia)     Past Surgical History  Procedure Date  . Total abdominal hysterectomy 03/2003     secondary to symptomatic fibroids.  . Cesarean section     x2  . Tubal ligation   .  elective abortion     x2  . Other surgical history      heel surgery, bilateral  . Stens     History   Social History  . Marital Status: Divorced     Spouse Name: N/A    Number of Children: N/A  . Years of Education: N/A   Occupational History  . Not on file.   Social History Main Topics  . Smoking status: Current Everyday Smoker -- 0.2 packs/day for 25 years    Types: Cigarettes  . Smokeless tobacco: Not on file  . Alcohol Use: 0.0 oz/week     occasionally  . Drug Use: No  . Sexually Active: Not Currently   Other Topics Concern  . Not on file   Social History Narrative  . No narrative on file    ROS: no fevers or chills, productive cough, hemoptysis, dysphasia, odynophagia, melena, hematochezia, dysuria, hematuria, rash, seizure activity, orthopnea, PND, pedal edema, claudication. Remaining systems are negative.  Physical Exam: Well-developed well-nourished in no acute distress.  Skin is warm and dry.  HEENT is normal.  Neck is supple.  Chest is clear to auscultation with normal expansion.  Cardiovascular exam is regular rate and rhythm.  Abdominal exam nontender or distended. No masses palpated. Extremities show no edema. neuro - cerebral palsy  ECG sinus bradycardia at a rate of 57. Occasional PVC. No significant ST changes.

## 2012-07-30 NOTE — Addendum Note (Signed)
Addended by: Marrion Coy L on: 07/30/2012 02:22 PM   Modules accepted: Orders

## 2012-08-12 ENCOUNTER — Ambulatory Visit: Payer: Medicare Other | Admitting: Internal Medicine

## 2012-08-15 ENCOUNTER — Ambulatory Visit (INDEPENDENT_AMBULATORY_CARE_PROVIDER_SITE_OTHER): Payer: Medicare Other | Admitting: Internal Medicine

## 2012-08-15 ENCOUNTER — Encounter: Payer: Self-pay | Admitting: Licensed Clinical Social Worker

## 2012-08-15 ENCOUNTER — Encounter: Payer: Self-pay | Admitting: Internal Medicine

## 2012-08-15 VITALS — BP 127/86 | HR 52 | Temp 97.6°F | Ht 65.0 in | Wt 178.3 lb

## 2012-08-15 DIAGNOSIS — F319 Bipolar disorder, unspecified: Secondary | ICD-10-CM

## 2012-08-15 DIAGNOSIS — L309 Dermatitis, unspecified: Secondary | ICD-10-CM

## 2012-08-15 DIAGNOSIS — I2510 Atherosclerotic heart disease of native coronary artery without angina pectoris: Secondary | ICD-10-CM

## 2012-08-15 DIAGNOSIS — G809 Cerebral palsy, unspecified: Secondary | ICD-10-CM

## 2012-08-15 DIAGNOSIS — Z720 Tobacco use: Secondary | ICD-10-CM

## 2012-08-15 DIAGNOSIS — Z23 Encounter for immunization: Secondary | ICD-10-CM

## 2012-08-15 DIAGNOSIS — G4700 Insomnia, unspecified: Secondary | ICD-10-CM

## 2012-08-15 DIAGNOSIS — F17200 Nicotine dependence, unspecified, uncomplicated: Secondary | ICD-10-CM

## 2012-08-15 DIAGNOSIS — Z0000 Encounter for general adult medical examination without abnormal findings: Secondary | ICD-10-CM

## 2012-08-15 DIAGNOSIS — E785 Hyperlipidemia, unspecified: Secondary | ICD-10-CM

## 2012-08-15 DIAGNOSIS — I10 Essential (primary) hypertension: Secondary | ICD-10-CM

## 2012-08-15 LAB — LIPID PANEL
HDL: 37 mg/dL — ABNORMAL LOW (ref >39)
LDL Cholesterol: 125 mg/dL — ABNORMAL HIGH (ref 0–99)
Total CHOL/HDL Ratio: 4.9 Ratio
Triglycerides: 90 mg/dL (ref <150)
VLDL: 18 mg/dL (ref 0–40)

## 2012-08-15 LAB — COMPLETE METABOLIC PANEL WITH GFR
ALT: 14 U/L (ref 0–35)
AST: 16 U/L (ref 0–37)
Creat: 0.68 mg/dL (ref 0.50–1.10)
Total Bilirubin: 0.5 mg/dL (ref 0.3–1.2)

## 2012-08-15 MED ORDER — HYDROCORTISONE 2.5 % EX LOTN: TOPICAL_LOTION | CUTANEOUS | Status: AC

## 2012-08-15 MED ORDER — TRAZODONE HCL 100 MG PO TABS: 100.0000 mg | ORAL_TABLET | Freq: Every day | ORAL | Status: DC

## 2012-08-15 MED ORDER — CITALOPRAM HYDROBROMIDE 20 MG PO TABS: 20.0000 mg | ORAL_TABLET | Freq: Every day | ORAL | Status: DC

## 2012-08-15 MED ORDER — BACLOFEN 10 MG PO TABS: 10.0000 mg | ORAL_TABLET | Freq: Every day | ORAL | Status: AC | PRN

## 2012-08-15 MED ORDER — TRAMADOL HCL 50 MG PO TABS: 50.0000 mg | ORAL_TABLET | Freq: Three times a day (TID) | ORAL | Status: DC | PRN

## 2012-08-15 MED ORDER — IBUPROFEN 200 MG PO TABS: 200.0000 mg | ORAL_TABLET | Freq: Four times a day (QID) | ORAL | Status: AC | PRN

## 2012-08-15 MED ORDER — SIMVASTATIN 40 MG PO TABS: 40.0000 mg | ORAL_TABLET | Freq: Every evening | ORAL | Status: DC

## 2012-08-15 MED ORDER — ZOLPIDEM TARTRATE 10 MG PO TABS: 10.0000 mg | ORAL_TABLET | Freq: Every evening | ORAL | Status: DC | PRN

## 2012-08-15 MED ORDER — CETIRIZINE HCL 10 MG PO CAPS: 10.0000 mg | ORAL_CAPSULE | Freq: Every day | ORAL | Status: DC

## 2012-08-15 MED ORDER — PANTOPRAZOLE SODIUM 40 MG PO TBEC: 40.0000 mg | DELAYED_RELEASE_TABLET | Freq: Every day | ORAL | Status: DC

## 2012-08-15 NOTE — Assessment & Plan Note (Signed)
Stable. Continue aspirin, statin, beta blocker. Refill today.

## 2012-08-15 NOTE — Assessment & Plan Note (Signed)
Flu shot today 

## 2012-08-15 NOTE — Addendum Note (Signed)
Addended by: Youlanda Roys A on: 08/15/2012 11:35 AM   Modules accepted: Orders

## 2012-08-15 NOTE — Assessment & Plan Note (Signed)
Insomnia worsened by spasms. Continue Ambien when necessary. Prescription for 4 months given.

## 2012-08-15 NOTE — Assessment & Plan Note (Signed)
Still continues to smoke. Advised To quit. She says it is hard but she is working on it.

## 2012-08-15 NOTE — Assessment & Plan Note (Signed)
Past LDL 84 in may 2013. Recheck it again and CMP today. Continue Zocor 40 mg daily. Refill for a year.

## 2012-08-15 NOTE — Patient Instructions (Signed)
Please make followup appointment in 3-4 months with primary care Dr. I will call you if the lab test shows any abnormalities. Continue taking all medications regularly. Take ibuprofen, tramadol, baclofen as needed for her spasms.

## 2012-08-15 NOTE — Progress Notes (Signed)
  Subjective:    Patient ID: [name], female    DOB: Jun 27, 1966, 46 y.o.   MRN: [id number]  HPI patient is a pleasant 46 year old woman with past history of cerebral palsy, CAD status post stenting, hypertension, bipolar disorder who comes to the clinic for medication refills. She does not complain of any specific symptoms today. Denies any chest pain, short of breath, abdominal pain, nausea, vomiting, fever, chills, diarrhea. She is followed by Dr. Jens Som with cardiology who saw her in August 13. She was followed by mental health- that she does not want to go there anymore. She is on Celexa and trazodone for her bipolar disorder. She reports intermittent worsening of her spasms which makes it hard for her to do her ADLs including bathing and getting in the bathroom. She has a teenage daughter who lives with her, but she does not get much help from her.  She intermittently uses a walker or a wheelchair at home as needed.     Review of Systems    as per history of present illness, all other systems reviewed and negative. Objective:   Physical Exam  General: NAD, in wheelchair. HEENT: PERRL, EOMI, no scleral icterus Cardiac: RRR, no rubs, murmurs or gallops Pulm: clear to auscultation bilaterally, moving normal volumes of air Abd: soft, nontender, nondistended, BS present Ext: warm and well perfused, no pedal edema Neuro: alert and oriented X3, cranial nerves II-XII grossly intact       Assessment & Plan:

## 2012-08-15 NOTE — Assessment & Plan Note (Signed)
Lab Results  Component Value Date   NA 141 01/02/2012   K 4.6 01/02/2012   CL 109 01/02/2012   CO2 24 01/02/2012   BUN 14 01/02/2012   CREATININE 0.70 01/02/2012   CREATININE 0.62 12/24/2011    BP Readings from Last 3 Encounters:  08/15/12 127/86  06/04/12 115/70  02/15/12 116/78    Assessment: Hypertension control:  controlled  Progress toward goals:  at goal Barriers to meeting goals:  no barriers identified  Plan: Hypertension treatment:  continue current medications. Continue metoprolol.

## 2012-08-15 NOTE — Assessment & Plan Note (Signed)
I gave refills were for 4 months of Celexa, trazodone and Ambien. Patient does not want to go to mental health any more if not needed. I would be okay prescribing them for 4 months worth and then will let her primary doctor decide about further prescriptions.

## 2012-08-15 NOTE — Progress Notes (Unsigned)
[name] was referred to CSW for community services.  Pt has 48 year old dau that lives with pt and currently working.  Dau income was included in Sec 8 housing and reimbursement was reduced. Pt is over income for Medicaid eligibility.  [name] utilizes [employer] for transportation.  Pt complains of having to depend on daughter to assist with personal care and would benefit from in-home aide services.  Pt states dau is not quite ready to be on her own yet.  Pt is looking for affordable housing for her and dau.  CSW met with [name] and utilized [email or web address] to find handicap accessible [place] with rent $500 or less.  Provided listing to pt.  In addition, pt in agreement for CSW to explore services that may be available through [place] [facility] [place].  CSW placed call to [facility] [phone], left message.

## 2012-08-21 ENCOUNTER — Telehealth: Payer: Self-pay | Admitting: Internal Medicine

## 2012-08-21 NOTE — Telephone Encounter (Signed)
Called patient to review results of lipid panel and CMP. LDL 125 from 84 in may 2013. No abnormalities on CMP. Patient on simvastatin 40 mg daily. In review of recent guidelines for prescribing statins - I would not change the dose of simvastatin. Discussed patient to continue taking the same medications. She was grateful that I called for lab results.

## 2012-11-14 ENCOUNTER — Encounter: Payer: Medicare Other | Admitting: Internal Medicine

## 2012-12-30 ENCOUNTER — Other Ambulatory Visit: Payer: Self-pay | Admitting: *Deleted

## 2012-12-30 DIAGNOSIS — E785 Hyperlipidemia, unspecified: Secondary | ICD-10-CM

## 2012-12-30 DIAGNOSIS — G809 Cerebral palsy, unspecified: Secondary | ICD-10-CM

## 2012-12-30 DIAGNOSIS — G4700 Insomnia, unspecified: Secondary | ICD-10-CM

## 2012-12-30 DIAGNOSIS — I10 Essential (primary) hypertension: Secondary | ICD-10-CM

## 2012-12-30 DIAGNOSIS — L309 Dermatitis, unspecified: Secondary | ICD-10-CM

## 2012-12-30 DIAGNOSIS — F319 Bipolar disorder, unspecified: Secondary | ICD-10-CM

## 2013-01-02 MED ORDER — TRAMADOL HCL 50 MG PO TABS: 50.0000 mg | ORAL_TABLET | Freq: Three times a day (TID) | ORAL | Status: DC | PRN

## 2013-01-02 MED ORDER — PANTOPRAZOLE SODIUM 40 MG PO TBEC: 40.0000 mg | DELAYED_RELEASE_TABLET | Freq: Every day | ORAL | Status: DC

## 2013-01-02 MED ORDER — ZOLPIDEM TARTRATE 10 MG PO TABS: 10.0000 mg | ORAL_TABLET | Freq: Every evening | ORAL | Status: DC | PRN

## 2013-01-07 NOTE — Telephone Encounter (Signed)
All 3 Rx called in to pharmacy. 

## 2013-01-13 ENCOUNTER — Ambulatory Visit (INDEPENDENT_AMBULATORY_CARE_PROVIDER_SITE_OTHER): Payer: Medicare Other | Admitting: Internal Medicine

## 2013-01-13 ENCOUNTER — Encounter: Payer: Self-pay | Admitting: Internal Medicine

## 2013-01-13 VITALS — BP 144/88 | HR 72 | Temp 97.0°F | Ht 64.0 in | Wt 185.8 lb

## 2013-01-13 DIAGNOSIS — G809 Cerebral palsy, unspecified: Secondary | ICD-10-CM

## 2013-01-13 DIAGNOSIS — M549 Dorsalgia, unspecified: Secondary | ICD-10-CM | POA: Insufficient documentation

## 2013-01-13 DIAGNOSIS — I10 Essential (primary) hypertension: Secondary | ICD-10-CM

## 2013-01-13 NOTE — Progress Notes (Signed)
I discussed the patient with resident Dr. Kalia-Reynolds at the time of the visit, and I reviewed the history, findings, diagnosis, and treatment plan as outlined in the resident's note. I agree with the plans as outlined in her note.  

## 2013-01-13 NOTE — Assessment & Plan Note (Signed)
Pertinent Data: BP Readings from Last 3 Encounters:  01/13/13 144/88  08/15/12 127/86  06/04/12 115/70    Basic Metabolic Panel:    Component Value Date/Time   NA 141 08/15/2012 1131   K 3.9 08/15/2012 1131   CL 106 08/15/2012 1131   CO2 27 08/15/2012 1131   BUN 8 08/15/2012 1131   CREATININE 0.68 08/15/2012 1131   CREATININE 0.62 12/24/2011 2141   GLUCOSE 79 08/15/2012 1131   CALCIUM 9.8 08/15/2012 1131    Assessment: Disease Control:  Uncontrolled  Progress toward goals:  Detiorated  Barriers to meeting goals: no barriers except recent weight gain and continued smoking.    This is an isolated elevated blood pressure with prior multiple values within normal limits. May be acutely affected by her pain. Therefore, I will not escalate her medication today..    Patient is compliant most of the time with prescribed medications.   Plan:  continue current medications  Encouraged tobacco cessation.  Educational resources provided:    Self management tools provided:

## 2013-01-13 NOTE — Patient Instructions (Addendum)
General Instructions:  Please follow-up at the clinic in 3 months, at which time we will reevaluate your blood pressure and back pain - OR, please follow-up in the clinic sooner if needed.  There have not been changes in your medications.   You need to see the physical therapists - they will help you with exercises and see if you need any equipment.  If you have been started on new medication(s), and you develop symptoms concerning for allergic reaction, including, but not limited to, throat closing, tongue swelling, rash, please stop the medication immediately and call the clinic at [phone], and go to the ER.  If you are diabetic, please bring your meter to your next visit.  If symptoms worsen, or new symptoms arise, please call the clinic or go to the ER.  PLEASE BRING ALL OF YOUR MEDICATIONS  IN A BAG TO YOUR NEXT APPOINTMENT   Treatment Goals:  Goals (1 Years of Data) as of 01/13/13         08/15/12 02/15/12 01/02/12     Result Component    . LDL CALC < 70  125 84 107      Progress Toward Treatment Goals:    Self Care Goals & Plans:  Self Care Goal 01/13/2013  Manage my medications bring my medications to every visit; refill my medications on time; take my medicines as prescribed  Monitor my health (No Data)  Eat healthy foods eat more vegetables; eat baked foods instead of fried foods  Be physically active find an activity I enjoy  Stop smoking (No Data)    Back Exercises Back exercises help treat and prevent back injuries. The goal of back exercises is to increase the strength of your abdominal and back muscles and the flexibility of your back. These exercises should be started when you no longer have back pain. Back exercises include:  Pelvic Tilt. Lie on your back with your knees bent. Tilt your pelvis until the lower part of your back is against the floor. Hold this position 5 to 10 sec and repeat 5 to 10 times.  Knee to Chest. Pull first 1 knee up against your chest  and hold for 20 to 30 seconds, repeat this with the other knee, and then both knees. This may be done with the other leg straight or bent, whichever feels better.  Sit-Ups or Curl-Ups. Bend your knees 90 degrees. Start with tilting your pelvis, and do a partial, slow sit-up, lifting your trunk only 30 to 45 degrees off the floor. Take at least 2 to 3 seconds for each sit-up. Do not do sit-ups with your knees out straight. If partial sit-ups are difficult, simply do the above but with only tightening your abdominal muscles and holding it as directed.  Hip-Lift. Lie on your back with your knees flexed 90 degrees. Push down with your feet and shoulders as you raise your hips a couple inches off the floor; hold for 10 seconds, repeat 5 to 10 times.  Back arches. Lie on your stomach, propping yourself up on bent elbows. Slowly press on your hands, causing an arch in your low back. Repeat 3 to 5 times. Any initial stiffness and discomfort should lessen with repetition over time.  Shoulder-Lifts. Lie face down with arms beside your body. Keep hips and torso pressed to floor as you slowly lift your head and shoulders off the floor. Do not overdo your exercises, especially in the beginning. Exercises may cause you some mild back discomfort which  lasts for a few minutes; however, if the pain is more severe, or lasts for more than 15 minutes, do not continue exercises until you see your caregiver. Improvement with exercise therapy for back problems is slow.  See your caregivers for assistance with developing a proper back exercise program. Document Released: 11/02/2004 Document Revised: 12/18/2011 Document Reviewed: 07/27/2011 [facility] Patient Information 2013 [place], [place].

## 2013-01-13 NOTE — Progress Notes (Signed)
Patient: [name]   MRN: [id number]  DOB: 06/07/1966  PCP: Priscella Mann, DO   Subjective:    HPI: [name] is a 47 y.o. female with a PMHx as outlined below, who presented to clinic today for the following:  1) Back pain - Patient describes a 2 week history of gradually worsening, sharp back pain located over left upper back - and left shoulder. Intermittent in nature, can last a few days at a time.  Currently rated 7/10 in severity. At its worst, the pain is rated 10/10 in severity. Aggravating factors include: prolonged sitting. Alleviating factors include: nothing. Patient denies leg weakness, new numbness, new weakness, new tingling, history of cancer, history of osteoporosis, history of steroid use. No trauma, falls, injury to the involved area.  2) HTN - Patient does not check blood pressure regularly at home. Currently taking Metoprolol XL 12.5mg  daily. Patient misses doses 0 x per week on average. denies headaches, dizziness, lightheadedness, shortness of breath.  does not request refills today.   Review of Systems: Per HPI.   Current Outpatient Medications: Medication Sig  . aspirin 81 MG tablet Take 81 mg by mouth daily.    . baclofen (LIORESAL) 10 MG tablet Take 1 tablet (10 mg total) by mouth daily as needed (for muscle spasms.).  Marland Kitchen Cetirizine HCl 10 MG CAPS Take 1 capsule (10 mg total) by mouth daily.  . hydrocortisone 2.5 % lotion Apply to affected area 2 times daily  . ibuprofen (ADVIL) 200 MG tablet Take 1-2 tablets (200-400 mg total) by mouth every 6 (six) hours as needed for pain (take with food, DO NOT EXCEED 2400 mg/day).  . metoprolol succinate (TOPROL-XL) 25 MG 24 hr tablet Take 0.5 tablets (12.5 mg total) by mouth daily.  . nitroGLYCERIN (NITROSTAT) 0.4 MG SL tablet Place 1 tablet (0.4 mg total) under the tongue every 5 (five) minutes x 3 doses as needed. For chest pain  . pantoprazole (PROTONIX) 40 MG tablet Take 1 tablet (40 mg  total) by mouth daily.  . simvastatin (ZOCOR) 40 MG tablet Take 1 tablet (40 mg total) by mouth every evening.  . traMADol (ULTRAM) 50 MG tablet Take 1 tablet (50 mg total) by mouth every 8 (eight) hours as needed. For pain  . zolpidem (AMBIEN) 10 MG tablet Take 1 tablet (10 mg total) by mouth at bedtime as needed for sleep. For insomnia    Allergies  Allergen Reactions  . Penicillins Hives    Past Medical History  Diagnosis Date  . Coronary artery disease 02/2008    a. s/p INF STEMI 5/09 tx with BMS to CFX;  b. Lexiscan Myoview 12/10 -  low risk, no ischemia.;   c. s/p NSTEMI 8/12: cath with LM 10%, LAD lum. irregs., oOM1 40%, mOM2 40%, mCFX stent occluded, RCA 50%, EF 45-50%; PCI - s/p DES to mCFX  d. Patent LCx stent, diffuse disease up to 50% in small nondominant RCA, 80% ostial stenosis in small OM3.   . Bipolar disorder      Followed by mental health, who prescribe her bipolar meds  . Cerebral palsy      with history of neuropathic pain  and spasticity,  requiring long-term pain medications  . Hypertension   . GERD (gastroesophageal reflux disease)   . Vitamin D deficiency 11/2009     patient treated with high-dose vitamin D weekly x3 months,  levels were rechecked and it was determined that the patient continued to  require high dose repletion of vitamin D.  . History of pelvic mass     Found on CT abd/ pelvis (04/2008) - 13.3 cm cystic pelvis mass, possibly left ovarian origin. To be followed at [facility].   Marland Kitchen HLD (hyperlipidemia)     Past Surgical History  Procedure Laterality Date  . Total abdominal hysterectomy  03/2003     secondary to symptomatic fibroids.  . Cesarean section      x2  . Tubal ligation    .  elective abortion      x2  . Other surgical history       heel surgery, bilateral  . Coronary angioplasty with stent placement      Objective:    Physical Exam: Filed Vitals:   01/13/13 1115  BP: 152/87  Pulse: 60  Temp: 97 F (36.1 C)     General: Vital  signs reviewed and noted. Well-developed, well-nourished, in no acute distress; alert, appropriate and cooperative throughout examination.  Neck: No deformities, masses, or tenderness noted. No lymphadenopathy.  Lungs:  Normal respiratory effort. Clear to auscultation BL without crackles or wheezes.  Heart: RRR. S1 and S2 normal without gallop, murmur, or rubs.  Abdomen:  BS normoactive. Soft, Nondistended, non-tender.  No masses or organomegaly.  Extremities: No pretibial edema. Normal range of motion, no right knee effusion, redness, or warmth identified.   Back:  tenderness to palpation over left trapezius musculature. Tenderness to palpation over lumbar paraspinal musculature. Full range of motion of back, no shoulder joint abnormalities including warmth, redness, swelling. Negative straight leg raise. No hip pain with internal or external rotation of the knee.    Assessment/ Plan:   The patient's case and plan of care was discussed with attending physician, Dr. Margarito Liner.

## 2013-01-13 NOTE — Assessment & Plan Note (Signed)
Pertinent Data: Basic Metabolic Panel:    Component Value Date/Time   NA 141 08/15/2012 1131   K 3.9 08/15/2012 1131   CL 106 08/15/2012 1131   CO2 27 08/15/2012 1131   BUN 8 08/15/2012 1131   CREATININE 0.68 08/15/2012 1131   CREATININE 0.62 12/24/2011 2141   GLUCOSE 79 08/15/2012 1131   CALCIUM 9.8 08/15/2012 1131    Assessment: Likely MSK pain, with tenderness to palpation over trapezius muscles, possibly from overuse from intermittent use of manual wheelchair. However, the patient does indicate that she has occasional severe spasms of her legs consistent with her cerebral palsy. I think therefore, that she would significantly benefit from physical therapy services. Additionally, may benefit from their evaluation of home health safety and need for any equipment.  Plan:      Continue PRN ibuprofen, tramadol for severe pain.  Will refer to PT for continued services.  Will speak with social worker regarding possible we getting mobile scooter.

## 2013-02-12 ENCOUNTER — Ambulatory Visit: Payer: Medicare Other | Admitting: Physical Therapy

## 2013-03-25 ENCOUNTER — Ambulatory Visit: Payer: Medicare Other | Admitting: Physical Therapy

## 2013-04-16 ENCOUNTER — Other Ambulatory Visit: Payer: Self-pay | Admitting: Internal Medicine

## 2013-04-16 DIAGNOSIS — Z1231 Encounter for screening mammogram for malignant neoplasm of breast: Secondary | ICD-10-CM

## 2013-04-24 NOTE — Addendum Note (Signed)
Addended by: Neomia Dear on: 04/24/2013 06:34 PM   Modules accepted: Orders

## 2013-04-25 ENCOUNTER — Ambulatory Visit (HOSPITAL_COMMUNITY): Payer: Medicare Other

## 2013-05-01 ENCOUNTER — Ambulatory Visit (HOSPITAL_COMMUNITY): Payer: Medicare Other | Attending: Internal Medicine

## 2013-05-27 ENCOUNTER — Ambulatory Visit (INDEPENDENT_AMBULATORY_CARE_PROVIDER_SITE_OTHER): Payer: Medicare Other | Admitting: Internal Medicine

## 2013-05-27 ENCOUNTER — Encounter: Payer: Self-pay | Admitting: Internal Medicine

## 2013-05-27 VITALS — BP 129/82 | HR 57 | Temp 97.4°F | Ht 65.0 in | Wt 179.0 lb

## 2013-05-27 DIAGNOSIS — G4700 Insomnia, unspecified: Secondary | ICD-10-CM

## 2013-05-27 DIAGNOSIS — E785 Hyperlipidemia, unspecified: Secondary | ICD-10-CM

## 2013-05-27 DIAGNOSIS — I10 Essential (primary) hypertension: Secondary | ICD-10-CM

## 2013-05-27 DIAGNOSIS — G809 Cerebral palsy, unspecified: Secondary | ICD-10-CM

## 2013-05-27 MED ORDER — PANTOPRAZOLE SODIUM 40 MG PO TBEC: 40.0000 mg | DELAYED_RELEASE_TABLET | Freq: Every day | ORAL | Status: DC

## 2013-05-27 MED ORDER — NICOTINE 21 MG/24HR TD PT24: 1.0000 | MEDICATED_PATCH | TRANSDERMAL | Status: DC

## 2013-05-27 NOTE — Patient Instructions (Signed)
We have sent in a refill for your protonix. We will also be giving you a prescription for your nicotin patch, 21 mg, since you have agreed to stop smoking. Stopping smoking will significantly reduce your risk for another heart attack, lung disease, reduce your blood pressure and also decrease your risk of cancer. Also take over the counter vit D tablets everyday- 1000 units- you will find them as vit D or D3. Also take your medications as prescribed, also the medications for you lipid levels- your simvastatin, as your cholesterol levels were high the last time.

## 2013-05-27 NOTE — Progress Notes (Signed)
Patient ID: [name], female   DOB: 04/15/1966, 47 y.o.   MRN: [id number]

## 2013-05-27 NOTE — Progress Notes (Signed)
Patient ID: [name], female   DOB: Feb 13, 1966, 47 y.o.   MRN: [id number]   Subjective:   Patient ID: [name] female   DOB: [birth year] 47 y.o.   MRN: [id number]  HPI: [name] is a 47 y.o. with a PMHx of infantile cerebral palsy, CAD s/p DES in 05/2011 and prior in 02/2008, prediabetes, hypertension, hyperlipidemia, GERD,and current tobacco abuse who presented to clinic today for routine check up and refill of her medication.  1.CAD- currently on aspirin, metoprolol, simvastatin- 40mg , and NTG. Follow ups with [facility] cardiology. No chest pain, dizziness or SOB today, no leg swelling. Currently still smoking, but says she is ready to stop, except for financial constraints in affording the nicotine patches. Patient reports she has not been totally compliant with her medications. LDL levels- 9 mths ago- 125.  2. HTN-  Currently controlled on metoprolol alone, Bp today- 129/82. 01/13/13- 144/88, 08/15/12- 127/86. Denies headaches, dizziness, lightheadedness, shortness of breath.   3. Cerebral palsy- Currently pt is on a wheel chair, able to walk on her feet sometimes, but says she has muscle spasm- controlled with baclofen. But no spasms in the past 3 weeks.   4. GERD- Symptoms currently well controlled on pantoprazole- 40mg  daily. Pt request refill today.  5. Pre- diabetes- Last Blood glucose- 01/02/2012- 97, HBA1c one year ago- 6. No symptoms of polyuria, polydypsia or polyphagia, no signif weight changes.   Past Medical History  Diagnosis Date  . Coronary artery disease 02/2008    a. s/p INF STEMI 5/09 tx with BMS to CFX;  b. Lexiscan Myoview 12/10 -  low risk, no ischemia.;   c. s/p NSTEMI 8/12: cath with LM 10%, LAD lum. irregs., oOM1 40%, mOM2 40%, mCFX stent occluded, RCA 50%, EF 45-50%; PCI - s/p DES to mCFX  d. Patent LCx stent, diffuse disease up to 50% in small nondominant RCA, 80% ostial stenosis in small OM3.   . Bipolar disorder      Followed by mental  health, who prescribe her bipolar meds  . Cerebral palsy      with history of neuropathic pain  and spasticity,  requiring long-term pain medications  . Hypertension   . GERD (gastroesophageal reflux disease)   . Vitamin D deficiency 11/2009     patient treated with high-dose vitamin D weekly x3 months,  levels were rechecked and it was determined that the patient continued to require high dose repletion of vitamin D.  . History of pelvic mass     Found on CT abd/ pelvis (04/2008) - 13.3 cm cystic pelvis mass, possibly left ovarian origin. To be [facility] at [facility].   Marland Kitchen HLD (hyperlipidemia)    Current Outpatient Prescriptions  Medication Sig Dispense Refill  . aspirin 81 MG tablet Take 81 mg by mouth daily.        . baclofen (LIORESAL) 10 MG tablet Take 1 tablet (10 mg total) by mouth daily as needed (for muscle spasms.).  30 tablet  3  . Cetirizine HCl 10 MG CAPS Take 1 capsule (10 mg total) by mouth daily.  30 capsule  3  . hydrocortisone 2.5 % lotion Apply to affected area 2 times daily  60 mL  3  . ibuprofen (ADVIL) 200 MG tablet Take 1-2 tablets (200-400 mg total) by mouth every 6 (six) hours as needed for pain (take with food, DO NOT EXCEED 2400 mg/day).  100 tablet  2  . metoprolol succinate (TOPROL-XL) 25 MG 24 hr tablet  Take 0.5 tablets (12.5 mg total) by mouth daily.  30 tablet  12  . nicotine (NICODERM CQ - DOSED IN MG/24 HOURS) 21 mg/24hr patch Place 1 patch onto the skin daily.  30 patch  1  . nitroGLYCERIN (NITROSTAT) 0.4 MG SL tablet Place 1 tablet (0.4 mg total) under the tongue every 5 (five) minutes x 3 doses as needed. For chest pain  25 tablet  12  . pantoprazole (PROTONIX) 40 MG tablet Take 1 tablet (40 mg total) by mouth daily.  30 tablet  4  . simvastatin (ZOCOR) 40 MG tablet Take 1 tablet (40 mg total) by mouth every evening.  30 tablet  11  . traMADol (ULTRAM) 50 MG tablet Take 1 tablet (50 mg total) by mouth every 8 (eight) hours as needed. For pain  30 tablet  2  .  zolpidem (AMBIEN) 10 MG tablet Take 1 tablet (10 mg total) by mouth at bedtime as needed for sleep. For insomnia  30 tablet  2   No current facility-administered medications for this visit.   Family History  Problem Relation Age of Onset  . Lung disease Mother 20    Died of respiratory failure  . Heart attack Father 40   History   Social History  . Marital Status: Divorced    Spouse Name: N/A    Number of Children: N/A  . Years of Education: N/A   Social History Main Topics  . Smoking status: Current Every Day Smoker -- 0.20 packs/day for 25 years    Types: Cigarettes  . Smokeless tobacco: Not on file  . Alcohol Use: 0.0 oz/week     Comment: occasionally  . Drug Use: No  . Sexual Activity: Not Currently   Other Topics Concern  . Not on file   Social History Narrative  . No narrative on file   Review of Systems: No pertinent findings on ROS.  Objective:  Physical Exam: Filed Vitals:   05/27/13 1906  BP: 129/82  Pulse: 57  Temp: 97.4 F (36.3 C)  Weight: 179 lb (81.194 kg)  SpO2: 98%   General appearance: alert, cooperative, appears stated age and no distress, on a wheel chair. Head: Normocephalic, without obvious abnormality, atraumatic Eyes: conjunctivae/corneas clear. PERRL, EOM's intact. Fundi benign. Neck: no adenopathy, no carotid bruit, no JVD, supple, symmetrical, trachea midline and thyroid not enlarged, symmetric, no tenderness/mass/nodules Back: symmetric, no curvature. ROM normal. No CVA tenderness. Lungs: clear to auscultation bilaterally Heart: regular rate and rhythm, S1, S2 normal, no murmur, click, rub or gallop Abdomen: soft, non-tender; bowel sounds normal; no masses,  no organomegaly Extremities: No pedal edema, Equinovarus present. Pulses: 2+ and symmetric Skin: dry skin, mildly scaly on both feet up to lower 3rd of the leg, with thickened toe nails.  Assessment & Plan:  Case discussed with Dr. Aundria Rud. - Pt counselled on strict  compliance with medication. Will recheck lipid profile on pts next visit. - Pt counselled on smoking cessation, told of her increase risk of another heart attacks. Pt said she is ready to quit, prescription for nicotine patches given.  - Pt has a hx of Vit d deficiency,last Vit D level- 02/15/2012- 33. Pt told to continue to taking Vit D supplement, which could be gotten over the counter at a very affordable price. Vit D 1000u daily. - Pt on is up to date on routine vaccinations, appointment for mammogram on the 17th sept 2014.Marland Kitchen

## 2013-05-28 ENCOUNTER — Ambulatory Visit (HOSPITAL_COMMUNITY): Payer: Medicare Other

## 2013-05-28 NOTE — Progress Notes (Signed)
I saw and evaluated the patient.  I personally confirmed the key portions of the history and exam documented by Dr. Emokpae and I reviewed pertinent patient test results.  The assessment, diagnosis, and plan were formulated together and I agree with the documentation in the resident's note. 

## 2013-06-15 ENCOUNTER — Other Ambulatory Visit: Payer: Self-pay | Admitting: Cardiology

## 2013-06-25 ENCOUNTER — Ambulatory Visit (HOSPITAL_COMMUNITY)
Admission: RE | Admit: 2013-06-25 | Discharge: 2013-06-25 | Disposition: A | Discharge status: Home / Self Care | Payer: Medicare Other | Source: Ambulatory Visit | Attending: Internal Medicine | Admitting: Internal Medicine

## 2013-06-25 DIAGNOSIS — Z1231 Encounter for screening mammogram for malignant neoplasm of breast: Secondary | ICD-10-CM | POA: Insufficient documentation

## 2013-06-26 ENCOUNTER — Other Ambulatory Visit: Payer: Self-pay | Admitting: Internal Medicine

## 2013-06-26 DIAGNOSIS — R928 Other abnormal and inconclusive findings on diagnostic imaging of breast: Secondary | ICD-10-CM

## 2013-06-26 NOTE — Progress Notes (Signed)
Quick Note:  Patient Contacted about the results of mammogram, and about need for follow up diagnostic mammogram. Contacted breast center [facility] imaging, and told appointment will be set up and patient will be contacted. ______

## 2013-07-09 ENCOUNTER — Ambulatory Visit
Admission: RE | Admit: 2013-07-09 | Discharge: 2013-07-09 | Disposition: A | Discharge status: Home / Self Care | Payer: Medicare Other | Source: Ambulatory Visit | Attending: Internal Medicine | Admitting: Internal Medicine

## 2013-07-09 DIAGNOSIS — R928 Other abnormal and inconclusive findings on diagnostic imaging of breast: Secondary | ICD-10-CM

## 2013-07-18 ENCOUNTER — Other Ambulatory Visit: Payer: Self-pay | Admitting: *Deleted

## 2013-07-18 DIAGNOSIS — G809 Cerebral palsy, unspecified: Secondary | ICD-10-CM

## 2013-07-22 ENCOUNTER — Other Ambulatory Visit: Payer: Self-pay | Admitting: *Deleted

## 2013-07-22 NOTE — Telephone Encounter (Signed)
DONE

## 2013-07-23 ENCOUNTER — Other Ambulatory Visit: Payer: Self-pay | Admitting: Internal Medicine

## 2013-07-23 DIAGNOSIS — G809 Cerebral palsy, unspecified: Secondary | ICD-10-CM

## 2013-07-23 MED ORDER — TRAMADOL HCL 50 MG PO TABS: 50.0000 mg | ORAL_TABLET | Freq: Three times a day (TID) | ORAL | Status: DC | PRN

## 2013-07-23 NOTE — Telephone Encounter (Signed)
Rx called in 

## 2013-10-03 ENCOUNTER — Emergency Department (HOSPITAL_COMMUNITY)
Admission: EM | Admit: 2013-10-03 | Discharge: 2013-10-03 | Disposition: A | Discharge status: Home / Self Care | Payer: Medicare Other | Attending: Emergency Medicine | Admitting: Emergency Medicine

## 2013-10-03 ENCOUNTER — Encounter (HOSPITAL_COMMUNITY): Payer: Self-pay | Admitting: Emergency Medicine

## 2013-10-03 ENCOUNTER — Other Ambulatory Visit: Payer: Self-pay | Admitting: Physician Assistant

## 2013-10-03 ENCOUNTER — Emergency Department (HOSPITAL_COMMUNITY): Payer: Medicare Other

## 2013-10-03 DIAGNOSIS — Z7982 Long term (current) use of aspirin: Secondary | ICD-10-CM | POA: Insufficient documentation

## 2013-10-03 DIAGNOSIS — R079 Chest pain, unspecified: Secondary | ICD-10-CM

## 2013-10-03 DIAGNOSIS — Z9861 Coronary angioplasty status: Secondary | ICD-10-CM | POA: Insufficient documentation

## 2013-10-03 DIAGNOSIS — Z8669 Personal history of other diseases of the nervous system and sense organs: Secondary | ICD-10-CM | POA: Insufficient documentation

## 2013-10-03 DIAGNOSIS — F17200 Nicotine dependence, unspecified, uncomplicated: Secondary | ICD-10-CM | POA: Insufficient documentation

## 2013-10-03 DIAGNOSIS — K219 Gastro-esophageal reflux disease without esophagitis: Secondary | ICD-10-CM

## 2013-10-03 DIAGNOSIS — R209 Unspecified disturbances of skin sensation: Secondary | ICD-10-CM | POA: Insufficient documentation

## 2013-10-03 DIAGNOSIS — Z8742 Personal history of other diseases of the female genital tract: Secondary | ICD-10-CM | POA: Insufficient documentation

## 2013-10-03 DIAGNOSIS — Z8659 Personal history of other mental and behavioral disorders: Secondary | ICD-10-CM | POA: Insufficient documentation

## 2013-10-03 DIAGNOSIS — I10 Essential (primary) hypertension: Secondary | ICD-10-CM

## 2013-10-03 DIAGNOSIS — G809 Cerebral palsy, unspecified: Secondary | ICD-10-CM

## 2013-10-03 DIAGNOSIS — G4700 Insomnia, unspecified: Secondary | ICD-10-CM

## 2013-10-03 DIAGNOSIS — E785 Hyperlipidemia, unspecified: Secondary | ICD-10-CM

## 2013-10-03 DIAGNOSIS — I2510 Atherosclerotic heart disease of native coronary artery without angina pectoris: Secondary | ICD-10-CM

## 2013-10-03 DIAGNOSIS — Z880 Allergy status to penicillin: Secondary | ICD-10-CM | POA: Insufficient documentation

## 2013-10-03 DIAGNOSIS — Z79899 Other long term (current) drug therapy: Secondary | ICD-10-CM | POA: Insufficient documentation

## 2013-10-03 HISTORY — DX: Chest pain, unspecified: R07.9

## 2013-10-03 LAB — POCT I-STAT TROPONIN I

## 2013-10-03 LAB — BASIC METABOLIC PANEL
BUN: 12 mg/dL (ref 6–23)
CO2: 22 mEq/L (ref 19–32)
Calcium: 9.5 mg/dL (ref 8.4–10.5)
Creatinine, Ser: 0.58 mg/dL (ref 0.50–1.10)
Glucose, Bld: 108 mg/dL — ABNORMAL HIGH (ref 70–99)

## 2013-10-03 LAB — CBC
HCT: 42 % (ref 36.0–46.0)
Hemoglobin: 14.5 g/dL (ref 12.0–15.0)
MCH: 31.4 pg (ref 26.0–34.0)
MCHC: 34.5 g/dL (ref 30.0–36.0)
MCV: 90.9 fL (ref 78.0–100.0)
RBC: 4.62 MIL/uL (ref 3.87–5.11)

## 2013-10-03 MED ORDER — PANTOPRAZOLE SODIUM 40 MG PO TBEC: 40.0000 mg | DELAYED_RELEASE_TABLET | Freq: Two times a day (BID) | ORAL | Status: DC

## 2013-10-03 MED ORDER — ASPIRIN 81 MG PO CHEW
324.0000 mg | CHEWABLE_TABLET | Freq: Once | ORAL | Status: AC
  Administered 2013-10-03: 324 mg via ORAL
  Filled 2013-10-03: qty 4

## 2013-10-03 MED ORDER — SIMVASTATIN 40 MG PO TABS: 40.0000 mg | ORAL_TABLET | Freq: Every evening | ORAL | Status: DC

## 2013-10-03 MED ORDER — NITROGLYCERIN 0.4 MG SL SUBL: 0.4000 mg | SUBLINGUAL_TABLET | SUBLINGUAL | Status: DC | PRN

## 2013-10-03 MED ORDER — METOPROLOL SUCCINATE ER 25 MG PO TB24: 12.5000 mg | ORAL_TABLET | Freq: Every day | ORAL | Status: DC

## 2013-10-03 MED ORDER — GI COCKTAIL ~~LOC~~
30.0000 mL | Freq: Once | ORAL | Status: AC
  Administered 2013-10-03: 30 mL via ORAL
  Filled 2013-10-03: qty 30

## 2013-10-03 NOTE — Consult Note (Signed)
CARDIOLOGY CONSULT NOTE  Patient ID: [name], MRN: [id number], DOB/AGE: [birth year] 47 y.o. Admit date: 10/03/2013 Date of Consult: 10/03/2013  Primary Physician: Kennis Carina, MD Primary Cardiologist: Dr Jens Som Referring Physician:   Chief Complaint: Chest Pain Reason for Consultation: Chest pain  HPI: 47 y.o. female w/ PMHx significant for CAD and previous PCI who presented to [facility] on 10/03/2013 with complaints of chest pain.  The patient describes 3 days of intermittent chest discomfort that feels like pressure in the substernal chest region and back. She has gastroesophageal reflux disease and describes lots of symptoms related to this. She has a difficult time distinguishing between indigestion and cardiac pain. She did run out of her proton next 3 or 4 days ago. She is also out of all of her antihypertensive medications. She's been eating [place] food, drinking coffee, and lots of chocolate milk. She has no exertional chest pain or shortness of breath. She denies palpitations, edema, orthopnea, or PND. When she presented in 2012 with her non-STEMI, she complained of left upper arm pain that felt like a "toothache." She has not had any of these symptoms over the previous 3 days.  Past Medical History  Diagnosis Date  . Coronary artery disease 02/2008    a. s/p INF STEMI 5/09 tx with BMS to CFX;  b. Lexiscan Myoview 12/10 -  low risk, no ischemia.;   c. s/p NSTEMI 8/12: cath with LM 10%, LAD lum. irregs., oOM1 40%, mOM2 40%, mCFX stent occluded, RCA 50%, EF 45-50%; PCI - s/p DES to mCFX  d. Patent LCx stent, diffuse disease up to 50% in small nondominant RCA, 80% ostial stenosis in small OM3.   . Bipolar disorder      Followed by mental health, who prescribe her bipolar meds  . Cerebral palsy      with history of neuropathic pain  and spasticity,  requiring long-term pain medications  . Hypertension   . GERD (gastroesophageal reflux disease)   .  Vitamin D deficiency 11/2009     patient treated with high-dose vitamin D weekly x3 months,  levels were rechecked and it was determined that the patient continued to require high dose repletion of vitamin D.  . History of pelvic mass     Found on CT abd/ pelvis (04/2008) - 13.3 cm cystic pelvis mass, possibly left ovarian origin. To be followed at [facility].   Marland Kitchen HLD (hyperlipidemia)       Surgical History:  Past Surgical History  Procedure Laterality Date  . Total abdominal hysterectomy  03/2003     secondary to symptomatic fibroids.  . Cesarean section      x2  . Tubal ligation    .  elective abortion      x2  . Other surgical history       heel surgery, bilateral  . Coronary angioplasty with stent placement       Home Meds: Prior to Admission medications   Medication Sig Start Date End Date Taking? Authorizing Provider  aspirin 81 MG tablet Take 81 mg by mouth daily.     Yes Historical Provider, MD  Cetirizine HCl 10 MG CAPS Take 1 capsule (10 mg total) by mouth daily. 08/15/12  Yes Sunday Spillers, MD  Cholecalciferol (D3-1000 PO) Take 1 capsule by mouth daily.   Yes Historical Provider, MD  ibuprofen (ADVIL,MOTRIN) 200 MG tablet Take 600 mg by mouth every 6 (six) hours as needed for moderate pain.   Yes  Historical Provider, MD  traMADol (ULTRAM) 50 MG tablet Take 1 tablet (50 mg total) by mouth every 8 (eight) hours as needed. For pain 07/23/13  Yes Ejiroghene Emokpae, MD  zolpidem (AMBIEN) 10 MG tablet Take 1 tablet (10 mg total) by mouth at bedtime as needed for sleep. For insomnia 12/30/12  Yes Maitri S Kalia-Reynolds, DO  metoprolol succinate (TOPROL-XL) 25 MG 24 hr tablet Take 0.5 tablets (12.5 mg total) by mouth daily. 10/03/13   Rhonda G Barrett, PA-C  nitroGLYCERIN (NITROSTAT) 0.4 MG SL tablet Place 1 tablet (0.4 mg total) under the tongue every 5 (five) minutes x 3 doses as needed. For chest pain 10/03/13   Joline Salt Barrett, PA-C  pantoprazole (PROTONIX) 40 MG tablet Take 1 tablet  (40 mg total) by mouth 2 (two) times daily. 10/03/13 10/03/14  Rhonda G Barrett, PA-C  simvastatin (ZOCOR) 40 MG tablet Take 1 tablet (40 mg total) by mouth every evening. 10/03/13 11/21/14  Darrol Jump, PA-C    Inpatient Medications:     Allergies:  Allergies  Allergen Reactions  . Penicillins Hives    History   Social History  . Marital Status: Divorced    Spouse Name: N/A    Number of Children: N/A  . Years of Education: N/A   Occupational History  . Not on file.   Social History Main Topics  . Smoking status: Current Every Day Smoker -- 0.20 packs/day for 25 years    Types: Cigarettes  . Smokeless tobacco: Not on file  . Alcohol Use: 0.0 oz/week     Comment: occasionally  . Drug Use: No  . Sexual Activity: Not Currently   Other Topics Concern  . Not on file   Social History Narrative  . No narrative on file     Family History  Problem Relation Age of Onset  . Lung disease Mother 49    Died of respiratory failure  . Heart attack Father 62     Review of Systems: General: negative for chills, fever, night sweats or weight changes.  ENT: negative for rhinorrhea or epistaxis Cardiovascular: negative for shortness of breath, dyspnea on exertion, edema, orthopnea, palpitations, or paroxysmal nocturnal dyspnea Dermatological: negative for rash Respiratory: negative for cough or wheezing GI: negative for nausea, vomiting, diarrhea, bright red blood per rectum, melena, or hematemesis GU: no hematuria, urgency, or frequency Neurologic: negative for visual changes, syncope, headache, or dizziness Heme: no easy bruising or bleeding Endo: negative for excessive thirst, thyroid disorder, or flushing Musculoskeletal: negative for joint pain or swelling, negative for myalgias. Positive for spasticity and limited walking. All other systems reviewed and are otherwise negative except as noted above.  Physical Exam: Blood pressure 154/79, pulse 51, temperature 98.3  F (36.8 C), temperature source Oral, resp. rate 12, last menstrual period 12/22/2007, SpO2 97.00%. General: Well developed, well nourished, alert and oriented, in no acute distress. HEENT: Normocephalic, atraumatic, sclera non-icteric, no xanthomas, nares are without discharge.  Neck: Supple. Carotids 2+ without bruits. JVP normal Lungs: Clear bilaterally to auscultation without wheezes, rales, or rhonchi. Breathing is unlabored. Heart: RRR with normal S1 and S2. No murmurs, rubs, or gallops appreciated. Abdomen: Soft, non-tender, non-distended with normoactive bowel sounds. No hepatomegaly. No rebound/guarding. No obvious abdominal masses. Back: No CVA tenderness Msk:  Strength and tone appear normal for age. Extremities: No clubbing, cyanosis, or edema.  Distal pedal pulses are 2+ and equal bilaterally. Neuro: CNII-XII intact, moves all extremities spontaneously. Psych:  Responds to questions appropriately with  a normal affect.    Labs: No results found for this basename: CKTOTAL, CKMB, TROPONINI,  in the last 72 hours Lab Results  Component Value Date   WBC 8.5 10/03/2013   HGB 14.5 10/03/2013   HCT 42.0 10/03/2013   MCV 90.9 10/03/2013   PLT 233 10/03/2013    Recent Labs Lab 10/03/13 1150  NA 139  K 3.9  CL 104  CO2 22  BUN 12  CREATININE 0.58  CALCIUM 9.5  GLUCOSE 108*   Lab Results  Component Value Date   CHOL 180 08/15/2012   HDL 37* 08/15/2012   LDLCALC 125* 08/15/2012   TRIG 90 08/15/2012   Lab Results  Component Value Date   DDIMER  Value: 0.29        AT THE INHOUSE ESTABLISHED CUTOFF VALUE OF 0.48 ug/mL FEU, THIS ASSAY HAS BEEN DOCUMENTED IN THE LITERATURE TO HAVE 09/02/2008    Radiology/Studies:  Dg Chest 2 View  10/03/2013   CLINICAL DATA:  Chest pain  EXAM: CHEST  2 VIEW  COMPARISON:  02/09/2012  FINDINGS: Cardiomediastinal silhouette is stable. S-shaped thoracolumbar scoliosis again noted. No acute infiltrate or pulmonary edema.  IMPRESSION: No active  cardiopulmonary disease.   Electronically Signed   By: Natasha Mead M.D.   On: 10/03/2013 12:47    EKG: Normal sinus rhythm heart rate 72 beats per minute, single PVC, otherwise within normal limits.  ASSESSMENT AND PLAN:  1. Atypical chest pain suggestive of gastroesophageal reflux 2. Coronary artery disease status post PCI in 2009 and 2012 3. Tobacco abuse, ongoing 4. Hypertension  Based on the fact the patient recently ran out of her PPI, with 3 days of chest pain and normal cardiac enzymes, I suspect this is pain related to acid reflux. We have called in a refill for Protonix. We have also refill her antihypertensive medications. Will check a Lexiscan Myoview stress test in the office to rule out significant ischemia. I feel comfortable proceeding on an outpatient basis since her cardiac markers and EKG are normal despite prolonged pain symptoms over 3 days. We discussed risk modifying behaviors such as tobacco cessation and dietary changes. She understands and will followup with Dr. Jens Som as an outpatient. Labs, radiographic data, and previous cardiac cath reports and outpatient records were reviewed. Discussed plan at length with the patient and her son.  Enzo Bi  10/03/2013, 3:14 PM

## 2013-10-03 NOTE — ED Notes (Signed)
Pt discharged but unable to d/c

## 2013-10-03 NOTE — ED Notes (Addendum)
Pt states she's had L CP that radiates to L shoulder/back x 3 days.  Pt states she has hx of GERD and is out of GERD meds.  Pt states she thought it was GERD, but wanted to be checked to verify.  Pt also c/o bilateral hand numbness.

## 2013-10-03 NOTE — ED Provider Notes (Signed)
Medical screening examination/treatment/procedure(s) were performed by non-physician practitioner and as supervising physician I was immediately available for consultation/collaboration.  EKG Interpretation    Date/Time:  Friday October 03 2013 11:05:49 EST Ventricular Rate:  71 PR Interval:  142 QRS Duration: 76 QT Interval:  398 QTC Calculation: 432 R Axis:   80 Text Interpretation:  Sinus rhythm with sinus arrhythmia with occasional Premature ventricular complexes Otherwise normal ECG No significant change since last tracing Confirmed by Ethelda Chick  MD, Shaquoia Miers ([id number]) on 10/03/2013 12:19:12 PM             Doug Sou, MD 10/03/13 1651

## 2013-10-03 NOTE — ED Provider Notes (Signed)
CSN: [id number]     Arrival date & time 10/03/13  1101 History   First MD Initiated Contact with Patient 10/03/13 1203     Chief Complaint  Patient presents with  . Chest Pain  . Numbness    bilateral hands   (Consider location/radiation/quality/duration/timing/severity/associated sxs/prior Treatment)  PCP: Kennis Carina [phone]) CARDIO: Dr. Jens Som  Patient is a 47 y.o. female presenting with chest pain.  Chest Pain Chest pain location: upper mid back. Pain quality: sharp   Pain radiates to:  L arm and R arm Pain radiates to the back: yes   Pain severity:  Mild Onset quality:  Sudden Duration:  3 days Timing:  Intermittent Progression:  Waxing and waning Chronicity:  Recurrent Context: at rest   Relieved by:  Nothing Ineffective treatments:  Aspirin Associated symptoms: anxiety, back pain and heartburn   Associated symptoms: no abdominal pain, no cough, no diaphoresis, no dizziness, no fever, no nausea, no near-syncope, no numbness, no palpitations, not vomiting and no weakness   Risk factors: coronary artery disease, high cholesterol, hypertension, obesity and smoking     PMH: 1. CAD 2. Bipolar disorder 3. Cerebral palsy 4. Hypertension 5. GERD 6. Hyperlipidemia 7. Current Everyday smoker 8. Obesity  Patient to the ER for further evaluation for chest pain. She has not been taking her acid erflux medication for the past week which she describes as severe but feels as though this is a similar to her previous MI pain. She denies having pain at this time but does admit it has been intermittent. Took 1 aspirin this morning.   Past Medical History  Diagnosis Date  . Coronary artery disease 02/2008    a. s/p INF STEMI 5/09 tx with BMS to CFX;  b. Lexiscan Myoview 12/10 -  low risk, no ischemia.;   c. s/p NSTEMI 8/12: cath with LM 10%, LAD lum. irregs., oOM1 40%, mOM2 40%, mCFX stent occluded, RCA 50%, EF 45-50%; PCI - s/p DES to mCFX  d. Patent LCx stent,  diffuse disease up to 50% in small nondominant RCA, 80% ostial stenosis in small OM3.   . Bipolar disorder      Followed by mental health, who prescribe her bipolar meds  . Cerebral palsy      with history of neuropathic pain  and spasticity,  requiring long-term pain medications  . Hypertension   . GERD (gastroesophageal reflux disease)   . Vitamin D deficiency 11/2009     patient treated with high-dose vitamin D weekly x3 months,  levels were rechecked and it was determined that the patient continued to require high dose repletion of vitamin D.  . History of pelvic mass     Found on CT abd/ pelvis (04/2008) - 13.3 cm cystic pelvis mass, possibly left ovarian origin. To be [facility] at [facility].   Marland Kitchen HLD (hyperlipidemia)    Past Surgical History  Procedure Laterality Date  . Total abdominal hysterectomy  03/2003     secondary to symptomatic fibroids.  . Cesarean section      x2  . Tubal ligation    .  elective abortion      x2  . Other surgical history       heel surgery, bilateral  . Coronary angioplasty with stent placement     Family History  Problem Relation Age of Onset  . Lung disease Mother 33    Died of respiratory failure  . Heart attack Father 43   History  Substance Use Topics  .  Smoking status: Current Every Day Smoker -- 0.20 packs/day for 25 years    Types: Cigarettes  . Smokeless tobacco: Not on file  . Alcohol Use: 0.0 oz/week     Comment: occasionally   OB History   Grav Para Term Preterm Abortions TAB SAB Ect Mult Living                 Review of Systems  Constitutional: Negative for fever and diaphoresis.  Respiratory: Negative for cough.   Cardiovascular: Positive for chest pain. Negative for palpitations and near-syncope.  Gastrointestinal: Positive for heartburn. Negative for nausea, vomiting and abdominal pain.  Musculoskeletal: Positive for back pain.  Neurological: Negative for dizziness, weakness and numbness.    Allergies   Penicillins  Home Medications   Current Outpatient Rx  Name  Route  Sig  Dispense  Refill  . aspirin 81 MG tablet   Oral   Take 81 mg by mouth daily.           . Cetirizine HCl 10 MG CAPS   Oral   Take 1 capsule (10 mg total) by mouth daily.   30 capsule   3   . Cholecalciferol (D3-1000 PO)   Oral   Take 1 capsule by mouth daily.         Marland Kitchen ibuprofen (ADVIL,MOTRIN) 200 MG tablet   Oral   Take 600 mg by mouth every 6 (six) hours as needed for moderate pain.         . traMADol (ULTRAM) 50 MG tablet   Oral   Take 1 tablet (50 mg total) by mouth every 8 (eight) hours as needed. For pain   30 tablet   2   . zolpidem (AMBIEN) 10 MG tablet   Oral   Take 1 tablet (10 mg total) by mouth at bedtime as needed for sleep. For insomnia   30 tablet   2   . metoprolol succinate (TOPROL-XL) 25 MG 24 hr tablet   Oral   Take 0.5 tablets (12.5 mg total) by mouth daily.   30 tablet   3   . nitroGLYCERIN (NITROSTAT) 0.4 MG SL tablet   Sublingual   Place 1 tablet (0.4 mg total) under the tongue every 5 (five) minutes x 3 doses as needed. For chest pain   25 tablet   12   . pantoprazole (PROTONIX) 40 MG tablet   Oral   Take 1 tablet (40 mg total) by mouth 2 (two) times daily.   60 tablet   4   . simvastatin (ZOCOR) 40 MG tablet   Oral   Take 1 tablet (40 mg total) by mouth every evening.   30 tablet   11     PLEASE DO NOT FILL UNTIL PATIENT CALLS.    BP 134/92  Pulse 59  Temp(Src) 98 F (36.7 C) (Oral)  Resp 20  SpO2 100%  LMP 12/22/2007 Physical Exam  Nursing note and vitals reviewed. Constitutional: She appears well-developed and well-nourished. No distress.  HENT:  Head: Normocephalic and atraumatic.  Eyes: Pupils are equal, round, and reactive to light.  Neck: Normal range of motion. Neck supple.  Cardiovascular: Normal rate and regular rhythm.   Pulmonary/Chest: Effort normal. No respiratory distress. She has no wheezes. She has no rales.  No pain to  palpation of chest or back.  Abdominal: Soft.  Neurological: She is alert.  Skin: Skin is warm and dry.    ED Course  Procedures (including critical care time) Labs  Review Labs Reviewed  BASIC METABOLIC PANEL - Abnormal; Notable for the following:    Glucose, Bld 108 (*)    All other components within normal limits  CBC  POCT I-STAT TROPONIN I  POCT I-STAT TROPONIN I   Imaging Review Dg Chest 2 View  10/03/2013   CLINICAL DATA:  Chest pain  EXAM: CHEST  2 VIEW  COMPARISON:  02/09/2012  FINDINGS: Cardiomediastinal silhouette is stable. S-shaped thoracolumbar scoliosis again noted. No acute infiltrate or pulmonary edema.  IMPRESSION: No active cardiopulmonary disease.   Electronically Signed   By: Natasha Mead M.D.   On: 10/03/2013 12:47    EKG Interpretation    Date/Time:  Friday October 03 2013 11:05:49 EST Ventricular Rate:  71 PR Interval:  142 QRS Duration: 76 QT Interval:  398 QTC Calculation: 432 R Axis:   80 Text Interpretation:  Sinus rhythm with sinus arrhythmia with occasional Premature ventricular complexes Otherwise normal ECG No significant change since last tracing Confirmed by JACUBOWITZ  MD, SAM ([id number]) on 10/03/2013 12:19:12 PM            MDM   1. CEREBRAL PALSY   2. HYPERTENSION, UNSPECIFIED   3. HYPERLIPIDEMIA   4. Insomnia   5. Hypertension   6. Gastro-esophageal reflux    Patients EKG and first troponin are reassuring. She has remained pain free in the ED and continues to decline pain medication. I have ordered a delta troponin and have asked [facility] to come see patient in the ED due to hx of MI and this feeling like the same despite negative Troponin.  2:55 pm Dr. Excell Seltzer saw patient in the ED. He feels she can go home. He is setting her up with an outpatient stress test and refilling her protonix and blood pressure medication.  47 y.o.[name] [place]'s evaluation in the Emergency Department is complete. It has been determined  that no acute conditions requiring further emergency intervention are present at this time. The patient/guardian have been advised of the diagnosis and plan. We have discussed signs and symptoms that warrant return to the ED, such as changes or worsening in symptoms.  Vital signs are stable at discharge. Filed Vitals:   10/03/13 1430  BP: 134/92  Pulse: 59  Temp:   Resp: 20    Patient/guardian has voiced understanding and agreed to follow-up with the PCP or specialist.    Dorthula Matas, PA-C 10/03/13 1455

## 2013-10-13 ENCOUNTER — Other Ambulatory Visit: Payer: Self-pay | Admitting: *Deleted

## 2013-10-13 DIAGNOSIS — E785 Hyperlipidemia, unspecified: Secondary | ICD-10-CM

## 2013-10-14 ENCOUNTER — Other Ambulatory Visit: Payer: Self-pay | Admitting: Cardiology

## 2013-10-15 MED ORDER — SIMVASTATIN 40 MG PO TABS: 40.0000 mg | ORAL_TABLET | Freq: Every evening | ORAL | Status: DC

## 2013-10-16 ENCOUNTER — Encounter (HOSPITAL_COMMUNITY): Payer: Medicare Other

## 2013-10-17 ENCOUNTER — Encounter (HOSPITAL_COMMUNITY): Payer: Self-pay | Admitting: Radiology

## 2013-10-17 NOTE — Progress Notes (Signed)
Patient ID: Victoria CheneyDonna G [name], female   DOB: 07/18/1966, 48 y.o.   MRN: [id number] Patient was scheduled for Myocardial perfusion study on 10/16/13, after [facility] with chest pain. She no showed for the appt yesterday. Bonnita LevanJackie Smith, RN attempted to call patient, left message on answering machine to call and reschedule.  Leonia CoronaWanda Ryett Hamman, RT-N

## 2013-10-22 ENCOUNTER — Encounter: Payer: Medicare Other | Admitting: Nurse Practitioner

## 2013-11-12 ENCOUNTER — Ambulatory Visit (INDEPENDENT_AMBULATORY_CARE_PROVIDER_SITE_OTHER): Payer: Medicare Other | Admitting: Internal Medicine

## 2013-11-12 ENCOUNTER — Encounter: Payer: Self-pay | Admitting: Internal Medicine

## 2013-11-12 VITALS — BP 120/70 | HR 57 | Temp 96.7°F | Ht 65.0 in | Wt 178.2 lb

## 2013-11-12 DIAGNOSIS — Z23 Encounter for immunization: Secondary | ICD-10-CM

## 2013-11-12 DIAGNOSIS — F17200 Nicotine dependence, unspecified, uncomplicated: Secondary | ICD-10-CM

## 2013-11-12 DIAGNOSIS — I10 Essential (primary) hypertension: Secondary | ICD-10-CM

## 2013-11-12 DIAGNOSIS — Z720 Tobacco use: Secondary | ICD-10-CM

## 2013-11-12 DIAGNOSIS — K219 Gastro-esophageal reflux disease without esophagitis: Secondary | ICD-10-CM

## 2013-11-12 DIAGNOSIS — I2510 Atherosclerotic heart disease of native coronary artery without angina pectoris: Secondary | ICD-10-CM

## 2013-11-12 DIAGNOSIS — E559 Vitamin D deficiency, unspecified: Secondary | ICD-10-CM

## 2013-11-12 DIAGNOSIS — R7303 Prediabetes: Secondary | ICD-10-CM

## 2013-11-12 DIAGNOSIS — R7309 Other abnormal glucose: Secondary | ICD-10-CM

## 2013-11-12 DIAGNOSIS — Z0000 Encounter for general adult medical examination without abnormal findings: Secondary | ICD-10-CM

## 2013-11-12 DIAGNOSIS — E785 Hyperlipidemia, unspecified: Secondary | ICD-10-CM

## 2013-11-12 DIAGNOSIS — G4700 Insomnia, unspecified: Secondary | ICD-10-CM

## 2013-11-12 DIAGNOSIS — J302 Other seasonal allergic rhinitis: Secondary | ICD-10-CM

## 2013-11-12 DIAGNOSIS — G809 Cerebral palsy, unspecified: Secondary | ICD-10-CM

## 2013-11-12 DIAGNOSIS — J309 Allergic rhinitis, unspecified: Secondary | ICD-10-CM

## 2013-11-12 LAB — GLUCOSE, CAPILLARY: Glucose-Capillary: 83 mg/dL (ref 70–99)

## 2013-11-12 LAB — POCT GLYCOSYLATED HEMOGLOBIN (HGB A1C): Hemoglobin A1C: 5.7

## 2013-11-12 MED ORDER — PANTOPRAZOLE SODIUM 40 MG PO TBEC: 40.0000 mg | DELAYED_RELEASE_TABLET | Freq: Two times a day (BID) | ORAL | Status: DC

## 2013-11-12 MED ORDER — METOPROLOL SUCCINATE ER 25 MG PO TB24: 12.5000 mg | ORAL_TABLET | Freq: Every day | ORAL | Status: DC

## 2013-11-12 MED ORDER — NICOTINE POLACRILEX 2 MG MT GUM: 2.0000 mg | CHEWING_GUM | OROMUCOSAL | Status: DC | PRN

## 2013-11-12 MED ORDER — CETIRIZINE HCL 10 MG PO CAPS: 10.0000 mg | ORAL_CAPSULE | Freq: Every day | ORAL | Status: DC

## 2013-11-12 MED ORDER — TRAMADOL HCL 50 MG PO TABS: 50.0000 mg | ORAL_TABLET | Freq: Three times a day (TID) | ORAL | Status: DC | PRN

## 2013-11-12 MED ORDER — ZOLPIDEM TARTRATE 5 MG PO TABS: 5.0000 mg | ORAL_TABLET | Freq: Every evening | ORAL | Status: DC | PRN

## 2013-11-12 MED ORDER — SIMVASTATIN 40 MG PO TABS: 40.0000 mg | ORAL_TABLET | Freq: Every evening | ORAL | Status: DC

## 2013-11-12 NOTE — Assessment & Plan Note (Signed)
Patient on Vitamin D 1000 mg daily (buys over the counter).

## 2013-11-12 NOTE — Assessment & Plan Note (Signed)
Wishes to continue taking Zyrtec as she feels this helps with her congestion.

## 2013-11-12 NOTE — Patient Instructions (Signed)
Please follow-up with Dr. Mariea Clonts in 3 months.   Keep up the good work taking all of your medicines!  We gave you printed prescriptions for all of your medicines today.  You may take them to whichever pharmacy you choose to get them filled.   Keep working on quitting smoking, we gave you a prescription for nicotine gum.  You can also call 1800QUITNOW to see if they will send you free nicotine patches.   You got your flu shot today!  You asked for a list of medicines that are ok to take in case you catch a cold.  Some good ones would be Coricidin, Tylenol Cold, and Delsym for cough.   We will check your cholesterol at your next visit so be sure you are taking your simvistatin.   Smoking Cessation Quitting smoking is important to your health and has many advantages. However, it is not always easy to quit since nicotine is a very addictive drug. Often times, people try 3 times or more before being able to quit. This document explains the best ways for you to prepare to quit smoking. Quitting takes hard work and a lot of effort, but you can do it. ADVANTAGES OF QUITTING SMOKING  You will live longer, feel better, and live better.  Your body will feel the impact of quitting smoking almost immediately.  Within 20 minutes, blood pressure decreases. Your pulse returns to its normal level.  After 8 hours, carbon monoxide levels in the blood return to normal. Your oxygen level increases.  After 24 hours, the chance of having a heart attack starts to decrease. Your breath, hair, and body stop smelling like smoke.  After 48 hours, damaged nerve endings begin to recover. Your sense of taste and smell improve.  After 72 hours, the body is virtually free of nicotine. Your bronchial tubes relax and breathing becomes easier.  After 2 to 12 weeks, lungs can hold more air. Exercise becomes easier and circulation improves.  The risk of having a heart attack, stroke, cancer, or lung disease is greatly  reduced.  After 1 year, the risk of coronary heart disease is cut in half.  After 5 years, the risk of stroke falls to the same as a nonsmoker.  After 10 years, the risk of lung cancer is cut in half and the risk of other cancers decreases significantly.  After 15 years, the risk of coronary heart disease drops, usually to the level of a nonsmoker.  If you are pregnant, quitting smoking will improve your chances of having a healthy baby.  The people you live with, especially any children, will be healthier.  You will have extra money to spend on things other than cigarettes. QUESTIONS TO THINK ABOUT BEFORE ATTEMPTING TO QUIT You may want to talk about your answers with your caregiver.  Why do you want to quit?  If you tried to quit in the past, what helped and what did not?  What will be the most difficult situations for you after you quit? How will you plan to handle them?  Who can help you through the tough times? Your family? Friends? A caregiver?  What pleasures do you get from smoking? What ways can you still get pleasure if you quit? Here are some questions to ask your caregiver:  How can you help me to be successful at quitting?  What medicine do you think would be best for me and how should I take it?  What should I do if  I need more help?  What is smoking withdrawal like? How can I get information on withdrawal? GET READY  Set a quit date.  Change your environment by getting rid of all cigarettes, ashtrays, matches, and lighters in your home, car, or work. Do not let people smoke in your home.  Review your past attempts to quit. Think about what worked and what did not. GET SUPPORT AND ENCOURAGEMENT You have a better chance of being successful if you have help. You can get support in many ways.  Tell your family, friends, and co-workers that you are going to quit and need their support. Ask them not to smoke around you.  Get individual, group, or telephone  counseling and support. Programs are available at [employer] and health centers. Call your local health department for information about programs in your area.  Spiritual beliefs and practices may help some smokers quit.  Download a "quit meter" on your computer to keep track of quit statistics, such as how long you have gone without smoking, cigarettes not smoked, and money saved.  Get a self-help book about quitting smoking and staying off of tobacco. LEARN NEW SKILLS AND BEHAVIORS  Distract yourself from urges to smoke. Talk to someone, go for a walk, or occupy your time with a task.  Change your normal routine. Take a different route to work. Drink tea instead of coffee. Eat breakfast in a different place.  Reduce your stress. Take a hot bath, exercise, or read a book.  Plan something enjoyable to do every day. Reward yourself for not smoking.  Explore interactive web-based programs that specialize in helping you quit. GET MEDICINE AND USE IT CORRECTLY Medicines can help you stop smoking and decrease the urge to smoke. Combining medicine with the above behavioral methods and support can greatly increase your chances of successfully quitting smoking.  Nicotine replacement therapy helps deliver nicotine to your body without the negative effects and risks of smoking. Nicotine replacement therapy includes nicotine gum, lozenges, inhalers, nasal sprays, and skin patches. Some may be available over-the-counter and others require a prescription.  Antidepressant medicine helps people abstain from smoking, but how this works is unknown. This medicine is available by prescription.  Nicotinic receptor partial agonist medicine simulates the effect of nicotine in your brain. This medicine is available by prescription. Ask your caregiver for advice about which medicines to use and how to use them based on your health history. Your caregiver will tell you what side effects to look out for if you  choose to be on a medicine or therapy. Carefully read the information on the package. Do not use any other product containing nicotine while using a nicotine replacement product.  RELAPSE OR DIFFICULT SITUATIONS Most relapses occur within the first 3 months after quitting. Do not be discouraged if you start smoking again. Remember, most people try several times before finally quitting. You may have symptoms of withdrawal because your body is used to nicotine. You may crave cigarettes, be irritable, feel very hungry, cough often, get headaches, or have difficulty concentrating. The withdrawal symptoms are only temporary. They are strongest when you first quit, but they will go away within 10 14 days. To reduce the chances of relapse, try to:  Avoid drinking alcohol. Drinking lowers your chances of successfully quitting.  Reduce the amount of caffeine you consume. Once you quit smoking, the amount of caffeine in your body increases and can give you symptoms, such as a rapid heartbeat, sweating, and anxiety.  Avoid smokers because they can make you want to smoke.  Do not let weight gain distract you. Many smokers will gain weight when they quit, usually less than 10 pounds. Eat a healthy diet and stay active. You can always lose the weight gained after you quit.  Find ways to improve your mood other than smoking. FOR MORE INFORMATION  www.smokefree.gov  Document Released: 09/19/2001 Document Revised: 03/26/2012 Document Reviewed: 01/04/2012 [facility] Patient Information 2014 [employer] [place].

## 2013-11-12 NOTE — Assessment & Plan Note (Signed)
Refilled Ambien prescription today, she only takes as needed and knows not to combine with Tramadol or Baclofen (though she is no longer taking this).  Discussed decreasing dose to 5 mg tablets, patient amenable.

## 2013-11-12 NOTE — Assessment & Plan Note (Signed)
Flu shot today 

## 2013-11-12 NOTE — Assessment & Plan Note (Signed)
LDL 125 in 08/2012 (off statin?).  Patient not on statin therapy until 5 days ago due to issue with medication refill thus lipid profile not appropriate today.  Patient now taking simvastatin 40 mg daily, provided refills today.  LDL goal < 70 due to CAD.  ] Lipid profile at follow-up visit in 3 months.

## 2013-11-12 NOTE — Assessment & Plan Note (Signed)
Patient continues to smoke 1/2 PPD.  She was unable to fill rx for nicotine patches due to financial constraints.  Told her about 1800QUITNOW and provided prescription for nicotine gum today in hopes that this will be more affordable.  She has a grandson due in March who will be living in her house so goal is to stop smoking by then.

## 2013-11-12 NOTE — Assessment & Plan Note (Signed)
Patient still experiencing some reflux symptoms, knows foods/drinks that trigger it.  Feels that Protonix really helps, provided refill today.

## 2013-11-12 NOTE — Progress Notes (Signed)
Patient ID: [name], female   DOB: 01-May-1966, 48 y.o.   MRN: [id number]   Subjective:   Patient ID: [name] female   DOB: [birth year] 48 y.o.   MRN: [id number]  HPI: [name] is a 48 y.o. woman with history of infantile cerebral palsy, CAD s/p DES in 02/2008 and 05/2011, HTN, HL, prediabetes, GERD who presents for routine follow-up.   Patient states she is doing well overall with exception of occasional spams in her feet.  She is not taking Baclofen anymore because it makes her so sleepy.  However, she gets relief with ibuprofen 400 mg plus Tramadol if necessary and wishes to continue this regimen.  States she never takes more than one Tramadol per day and doesn't take it every day as it also makes her somewhat sleepy.  She is willing to sign a pain contract today.   Patient requesting paper copies of all of her prescriptions.  She had been getting her medicines from the [facility] on [address] but states this is no longer working with the bus schedule because the departing bus arrives 2 minutes after she is dropped off.  She has not yet decided where she would like to fill her prescriptions instead (needs to look at bus schedule, discuss with family members).    Patient is still smoking, about 1/2 PPD.  She tried to get nicotine patches filled after her last visit but could not do so due to financial constraints.  She is interested in quitting soon as she will have a new grandson in 1 month.    Of note, patient had an issue getting refill of simvastatin after her last clinic visit.  Therefore, she has only been taking it for the past 5 days, had not had it for a month prior to that.   She is willing to get flu shot today.   Past Medical History  Diagnosis Date  . Coronary artery disease 02/2008    a. s/p INF STEMI 5/09 tx with BMS to CFX;  b. Lexiscan Myoview 12/10 -  low risk, no ischemia.;   c. s/p NSTEMI 8/12: cath with LM 10%, LAD lum. irregs., oOM1 40%, mOM2 40%,  mCFX stent occluded, RCA 50%, EF 45-50%; PCI - s/p DES to mCFX  d. Patent LCx stent, diffuse disease up to 50% in small nondominant RCA, 80% ostial stenosis in small OM3.   . Bipolar disorder      Followed by mental health, who prescribe her bipolar meds  . Cerebral palsy      with history of neuropathic pain  and spasticity,  requiring long-term pain medications  . Hypertension   . GERD (gastroesophageal reflux disease)   . Vitamin D deficiency 11/2009     patient treated with high-dose vitamin D weekly x3 months,  levels were rechecked and it was determined that the patient continued to require high dose repletion of vitamin D.  . History of pelvic mass     Found on CT abd/ pelvis (04/2008) - 13.3 cm cystic pelvis mass, possibly left ovarian origin. To be followed at [facility].   Marland Kitchen HLD (hyperlipidemia)    Current Outpatient Prescriptions  Medication Sig Dispense Refill  . aspirin 81 MG tablet Take 81 mg by mouth daily.        . Cetirizine HCl 10 MG CAPS Take 1 capsule (10 mg total) by mouth daily.  30 capsule  3  . Cholecalciferol (D3-1000 PO) Take 1 capsule by mouth daily.      Marland Kitchen  ibuprofen (ADVIL,MOTRIN) 200 MG tablet Take 600 mg by mouth every 6 (six) hours as needed for moderate pain.      . metoprolol succinate (TOPROL-XL) 25 MG 24 hr tablet Take 0.5 tablets (12.5 mg total) by mouth daily.  30 tablet  11  . pantoprazole (PROTONIX) 40 MG tablet Take 1 tablet (40 mg total) by mouth 2 (two) times daily.  60 tablet  11  . simvastatin (ZOCOR) 40 MG tablet Take 1 tablet (40 mg total) by mouth every evening.  30 tablet  11  . traMADol (ULTRAM) 50 MG tablet Take 1 tablet (50 mg total) by mouth every 8 (eight) hours as needed. For pain  30 tablet  2  . zolpidem (AMBIEN) 5 MG tablet Take 1 tablet (5 mg total) by mouth at bedtime as needed for sleep. For insomnia  30 tablet  2  . nicotine polacrilex (EQ NICOTINE) 2 MG gum Take 1 each (2 mg total) by mouth as needed for smoking cessation.  100 tablet  0    . nitroGLYCERIN (NITROSTAT) 0.4 MG SL tablet Place 1 tablet (0.4 mg total) under the tongue every 5 (five) minutes x 3 doses as needed. For chest pain  25 tablet  12   No current facility-administered medications for this visit.   Family History  Problem Relation Age of Onset  . Lung disease Mother [id number]    Died of respiratory failure  . Heart attack Father [phone]   History   Social History  . Marital Status: Divorced    Spouse Name: N/A    Number of Children: N/A  . Years of Education: N/A   Social History Main Topics  . Smoking status: Current Every Day Smoker -- 0.50 packs/day for 25 years    Types: Cigarettes  . Smokeless tobacco: None  . Alcohol Use: 0.0 oz/week     Comment: occasionally  . Drug Use: No  . Sexual Activity: Not Currently   Other Topics Concern  . None   Social History Narrative  . None   Review of Systems: Review of Systems  Constitutional: Negative for fever and weight loss.  Eyes: Negative for blurred vision.  Respiratory: Negative for cough and shortness of breath.   Cardiovascular: Negative for chest pain, palpitations and leg swelling.  Gastrointestinal: Positive for heartburn. Negative for nausea, vomiting, abdominal pain, diarrhea, constipation and blood in stool.  Genitourinary: Negative for dysuria.  Musculoskeletal: Negative for falls.  Neurological: Negative for dizziness, loss of consciousness, weakness and headaches.    Objective:  Physical Exam: Filed Vitals:   11/12/13 1322 11/12/13 1430  BP: 146/84 120/70  Pulse: 57   Temp: 96.7 F (35.9 C)   TempSrc: Oral   Height: 5\' 5"  (1.651 m)   Weight: 178 lb 3.2 oz (80.831 kg)   SpO2: 100%    General: alert, cooperative, and in no apparent distress HEENT: NCAT, vision grossly intact, oropharynx clear and non-erythematous  Neck: supple, no lymphadenopathy Lungs: clear to ascultation bilaterally, normal work of respiration, no wheezes, rales, ronchi Heart: regular rate and rhythm, no  murmurs, gallops, or rubs Abdomen: soft, non-tender, non-distended, normal bowel sounds Extremities: 2+ DP/PT pulses bilaterally, no cyanosis, clubbing, or edema Neurologic: alert & oriented X3, cranial nerves II-XII intact, strength grossly intact, sensation intact to light touch  Assessment & Plan:  Patient discussed with Dr. Rogelia BogaButcher.  Please see problem-based assessment and plan.

## 2013-11-12 NOTE — Assessment & Plan Note (Addendum)
Stable, no chest pain.  Taking ASA, metoprolol, simvastatin.  Appropriately B blocked with HR 57.  Recently filled NTG, carries these in her purse; we reviewed when it is appropriate to take this medication.

## 2013-11-12 NOTE — Assessment & Plan Note (Signed)
Repeat A1C 5.7% today thus no concern for diabetes.

## 2013-11-12 NOTE — Assessment & Plan Note (Addendum)
Patient reports occasional spams in her feet.  She is not taking Baclofen anymore because it makes her so sleepy.  However, she gets relief with ibuprofen 400 mg plus Tramadol if necessary and wishes to continue this regimen.  States she never takes more than one Tramadol per day and doesn't take it every day as it also makes her somewhat sleepy.  Provided Tramadol refill, patient signed pain contract today.

## 2013-11-12 NOTE — Assessment & Plan Note (Signed)
BP Readings from Last 3 Encounters:  11/12/13 120/70  10/03/13 154/79  05/27/13 129/82    Lab Results  Component Value Date   NA 139 10/03/2013   K 3.9 10/03/2013   CREATININE 0.58 10/03/2013    Assessment: Blood pressure control:  at goal   Progress toward BP goal:   stable  Plan: Medications:  continue current medications (metoprolol alone)  Educational resources provided: brochure Self management tools provided: home blood pressure logbook

## 2013-11-13 NOTE — Progress Notes (Signed)
Case discussed with Dr. Rogers at the time of the visit.  We reviewed the resident's history and exam and pertinent patient test results.  I agree with the assessment, diagnosis, and plan of care documented in the resident's note. 

## 2013-11-17 ENCOUNTER — Encounter: Payer: Medicare Other | Admitting: Nurse Practitioner

## 2014-02-17 ENCOUNTER — Encounter: Payer: Medicare Other | Admitting: Internal Medicine

## 2014-02-20 ENCOUNTER — Encounter: Payer: Self-pay | Admitting: Internal Medicine

## 2014-02-20 ENCOUNTER — Ambulatory Visit (INDEPENDENT_AMBULATORY_CARE_PROVIDER_SITE_OTHER): Payer: Medicare Other | Admitting: Internal Medicine

## 2014-02-20 VITALS — BP 134/88 | HR 54 | Temp 97.9°F | Wt 178.8 lb

## 2014-02-20 DIAGNOSIS — G809 Cerebral palsy, unspecified: Secondary | ICD-10-CM

## 2014-02-20 DIAGNOSIS — E785 Hyperlipidemia, unspecified: Secondary | ICD-10-CM

## 2014-02-20 LAB — LIPID PANEL
Cholesterol: 196 mg/dL (ref 0–200)
HDL: 47 mg/dL (ref >39)
LDL CALC: 131 mg/dL — AB (ref 0–99)
Total CHOL/HDL Ratio: 4.2 Ratio
Triglycerides: 88 mg/dL (ref <150)
VLDL: 18 mg/dL (ref 0–40)

## 2014-02-20 LAB — MAGNESIUM: MAGNESIUM: 1.9 mg/dL (ref 1.5–2.5)

## 2014-02-20 MED ORDER — DIAZEPAM 5 MG PO TABS: 5.0000 mg | ORAL_TABLET | Freq: Four times a day (QID) | ORAL | Status: DC | PRN

## 2014-02-20 MED ORDER — HYDROCORTISONE 1 % EX CREA: TOPICAL_CREAM | CUTANEOUS | Status: DC

## 2014-02-20 NOTE — Patient Instructions (Addendum)
We will be prescribing a medication called Valium for you. Please take one tablet as needed when you have cramps. Do not combine this medication with Ambien/Zolpidem or Tramadol.  If the Valium works for you, and doesn't make you drowsy, call for another prescription. For now we wont give you a prescription for tramadol or Zolpidem.  Also please try to stop smoking. Please come to an agreement with your daughter about quitting smoking, set a quit date. You can call 1-800-QUIT Now.   Please bring your medicines with you each time you come to clinic.  Medicines may include prescription medications, over-the-counter medications, herbal remedies, eye drops, vitamins, or other pills.     Muscle Cramps and Spasms Muscle cramps and spasms occur when a muscle or muscles tighten and you have no control over this tightening (involuntary muscle contraction). They are a common problem and can develop in any muscle. The most common place is in the calf muscles of the leg. Both muscle cramps and muscle spasms are involuntary muscle contractions, but they also have differences:   Muscle cramps are sporadic and painful. They may last a few seconds to a quarter of an hour. Muscle cramps are often more forceful and last longer than muscle spasms.  Muscle spasms may or may not be painful. They may also last just a few seconds or much longer. CAUSES  It is uncommon for cramps or spasms to be due to a serious underlying problem. In many cases, the cause of cramps or spasms is unknown. Some common causes are:   Overexertion.   Overuse from repetitive motions (doing the same thing over and over).   Remaining in a certain position for a long period of time.   Improper preparation, form, or technique while performing a sport or activity.   Dehydration.   Injury.   Side effects of some medicines.   Abnormally low levels of the salts and ions in your blood (electrolytes), especially potassium and  calcium. This could happen if you are taking water pills (diuretics) or you are pregnant.  Some underlying medical problems can make it more likely to develop cramps or spasms. These include, but are not limited to:   Diabetes.   Parkinson disease.   Hormone disorders, such as thyroid problems.   Alcohol abuse.   Diseases specific to muscles, joints, and bones.   Blood vessel disease where not enough blood is getting to the muscles.  HOME CARE INSTRUCTIONS   Stay well hydrated. Drink enough water and fluids to keep your urine clear or pale yellow.  It may be helpful to massage, stretch, and relax the affected muscle.  For tight or tense muscles, use a warm towel, heating pad, or hot shower water directed to the affected area.  If you are sore or have pain after a cramp or spasm, applying ice to the affected area may relieve discomfort.  Put ice in a plastic bag.  Place a towel between your skin and the bag.  Leave the ice on for 15-20 minutes, 03-04 times a day.  Medicines used to treat a known cause of cramps or spasms may help reduce their frequency or severity. Only take over-the-counter or prescription medicines as directed by your caregiver. SEEK MEDICAL CARE IF:  Your cramps or spasms get more severe, more frequent, or do not improve over time.  MAKE SURE YOU:   Understand these instructions.  Will watch your condition.  Will get help right away if you are not doing  well or get worse.

## 2014-02-22 NOTE — Assessment & Plan Note (Signed)
Patient complaints of muscle cramps/spasticity, related to her cerebral palsy. She says she will prefer a medication like flexeril, but that it makes her drowsy. Says flexeril works better than the pain meds she takes presently - Tramadol and Ibuprofen. Bmet- 09/2013- Ca- WNL at 9.5.   Plan- Trial of Valium- 5mg  Q6H as needed for Spasms. Counselled pt not to take tramadol or Zolpidem while taking this medication. #20 tablets called in. - Magnesium levels today- 1.9.

## 2014-02-22 NOTE — Assessment & Plan Note (Addendum)
Pt says she is complaint with her medication- Simvastatin 40mg  daily, but admits to eating a lot of fatty foods over the past 3 months. She is afraid her cholesterol levels will be high. LDL- 125 08/2012.  Plan- Lipid profile check today.- LDL- 131.   Addendum- 02/24/2014- Called pt about switching her cholesterol medication to high intensity statin, but pt now admits she hasn't been compliant with her Simvastatin. Will continue current dose of simvastatin- 40mg  for now. Emphasized the importance of adherence to medication therapy and dietary modification considering hx of CAD.

## 2014-02-22 NOTE — Progress Notes (Signed)
Patient ID: [name], female   DOB: 04/18/1966, 48 y.o.   MRN: [id number]   Subjective:   Patient ID: Victoria CheneyDonna G [name] female   DOB: 03/15/1966 48 y.o.   MRN: [id number]  HPI: [name] is a 48 y.o. with PMH of Cerebral Palsy, CAD- s/p PICA and stenting in 2009, HTN, presented today for routine checkup. Please see problem based charting for review of pts chronic medical problems.   Past Medical History  Diagnosis Date  . Coronary artery disease 02/2008    a. s/p INF STEMI 5/09 tx with BMS to CFX;  b. Lexiscan Myoview 12/10 -  low risk, no ischemia.;   c. s/p NSTEMI 8/12: cath with LM 10%, LAD lum. irregs., oOM1 40%, mOM2 40%, mCFX stent occluded, RCA 50%, EF 45-50%; PCI - s/p DES to mCFX  d. Patent LCx stent, diffuse disease up to 50% in small nondominant RCA, 80% ostial stenosis in small OM3.   . Bipolar disorder      Followed by mental health, who prescribe her bipolar meds  . Cerebral palsy      with history of neuropathic pain  and spasticity,  requiring long-term pain medications  . Hypertension   . GERD (gastroesophageal reflux disease)   . Vitamin D deficiency 11/2009     patient treated with high-dose vitamin D weekly x3 months,  levels were rechecked and it was determined that the patient continued to require high dose repletion of vitamin D.  . History of pelvic mass     Found on CT abd/ pelvis (04/2008) - 13.3 cm cystic pelvis mass, possibly left ovarian origin. [facility]   Marland Kitchen. HLD (hyperlipidemia)    Current Outpatient Prescriptions  Medication Sig Dispense Refill  . aspirin 81 MG tablet Take 81 mg by mouth daily.        . Cetirizine HCl 10 MG CAPS Take 1 capsule (10 mg total) by mouth daily.  30 capsule  3  . Cholecalciferol (D3-1000 PO) Take 1 capsule by mouth daily.      . diazepam (VALIUM) 5 MG tablet Take 1 tablet (5 mg total) by mouth every 6 (six) hours as needed for anxiety.  20 tablet  0  . hydrocortisone cream 1 % Apply to affected  area 2 times daily  30 g  1  . ibuprofen (ADVIL,MOTRIN) 200 MG tablet Take 600 mg by mouth every 6 (six) hours as needed for moderate pain.      . metoprolol succinate (TOPROL-XL) 25 MG 24 hr tablet Take 0.5 tablets (12.5 mg total) by mouth daily.  30 tablet  11  . nicotine polacrilex (EQ NICOTINE) 2 MG gum Take 1 each (2 mg total) by mouth as needed for smoking cessation.  100 tablet  0  . nitroGLYCERIN (NITROSTAT) 0.4 MG SL tablet Place 1 tablet (0.4 mg total) under the tongue every 5 (five) minutes x 3 doses as needed. For chest pain  25 tablet  12  . pantoprazole (PROTONIX) 40 MG tablet Take 1 tablet (40 mg total) by mouth 2 (two) times daily.  60 tablet  11  . simvastatin (ZOCOR) 40 MG tablet Take 1 tablet (40 mg total) by mouth every evening.  30 tablet  11  . traMADol (ULTRAM) 50 MG tablet Take 1 tablet (50 mg total) by mouth every 8 (eight) hours as needed. For pain  30 tablet  2  . zolpidem (AMBIEN) 5 MG tablet Take 1 tablet (5 mg total)  by mouth at bedtime as needed for sleep. For insomnia  30 tablet  2   No current facility-administered medications for this visit.   Family History  Problem Relation Age of Onset  . Lung disease Mother 3862    Died of respiratory failure  . Heart attack Father [phone]   History   Social History  . Marital Status: Divorced    Spouse Name: N/A    Number of Children: N/A  . Years of Education: N/A   Social History Main Topics  . Smoking status: Current Every Day Smoker -- 0.50 packs/day for 25 years    Types: Cigarettes  . Smokeless tobacco: None  . Alcohol Use: 0.0 oz/week     Comment: occasionally  . Drug Use: No  . Sexual Activity: Not Currently   Other Topics Concern  . None   Social History Narrative  . None   Review of Systems: CONSTITUTIONAL- No Fever, weightloss, night sweat or change in appetite. SKIN- No Rash, colour changes or itching. HEAD- No Headache or dizziness. EYES- No Vision loss, pain, redness, double or blurred  vision. EARS- No vertigo, hearing loss or ear discharge. Mouth/throat- No Sorethroat, dentures, or bleeding gums. RESPIRATORY- No Cough or SOB. CARDIAC- No Palpitations, DOE, PND or chest pain. GI- No nausea, vomiting, diarrhoea, constipation, abd pain. URINARY- No Frequency, urgency, straining or dysuria. NEUROLOGIC- No Numbness, syncope, seizures or burning. But has muscle cramps, related to her cerebral palsy, mostly independent but sometimes uses a wheel chair at home.  [facility]- Denies depression or anxiety.   Objective:  Physical Exam: Filed Vitals:   02/20/14 1604  BP: 134/88  Pulse: 54  Temp: 97.9 F (36.6 C)  TempSrc: Oral  Weight: 178 lb 12.8 oz (81.103 kg)  SpO2: 100%   GENERAL- alert, co-operative, appears as stated age, not in any distress. HEENT- Atraumatic, normocephalic, PERRL, EOMI, oral mucosa appears moist, good and intact dentition, no cervical LN enlargement, thyroid does not appear enlarged, neck supple. CARDIAC- RRR, no murmurs, rubs or gallops. RESP- Moving equal volumes of air, and clear to auscultation bilaterally, no wheezes or crackles. ABDOMEN- Soft, nontender, no guarding or rebound, no palpable masses or organomegaly, bowel sounds present. BACK- Normal curvature of the spine, No tenderness along the vertebrae, no CVA tenderness. NEURO- No obvious Cr N abnormality, strenght upper and lower extremities- 4+ lower extremities, 5- upper. EXTREMITIES- pulse 2+, symmetric, no pedal edema. SKIN- Warm, dry, No rash or lesion. PSYCH- Normal mood and affect, appropriate thought content and speech.  Assessment & Plan:   The patient's case and plan of care was discussed with attending physician, Dr. Richardo PriestJ. Granfortuna.  Please see problem based charting for assessment and plan.

## 2014-02-23 NOTE — Progress Notes (Signed)
Attending physician note: Presenting complaints, physical findings, and medications, reviewed with resident physician Dr. Emokpae and I concur with her management. James Granfortuna, M.D., FACP 

## 2014-02-24 ENCOUNTER — Other Ambulatory Visit: Payer: Self-pay | Admitting: Internal Medicine

## 2014-03-24 ENCOUNTER — Other Ambulatory Visit: Payer: Self-pay

## 2014-03-24 DIAGNOSIS — E785 Hyperlipidemia, unspecified: Secondary | ICD-10-CM

## 2014-03-24 MED ORDER — SIMVASTATIN 40 MG PO TABS: 40.0000 mg | ORAL_TABLET | Freq: Every evening | ORAL | Status: DC

## 2014-04-02 IMAGING — CR DG CHEST 2V
2 series · 2 of 2 positions shown · non-contrast
Comparison: 12/24/2011

CLINICAL DATA: Follow up pneumonia

CHEST - 2 VIEW

[w chest pa]
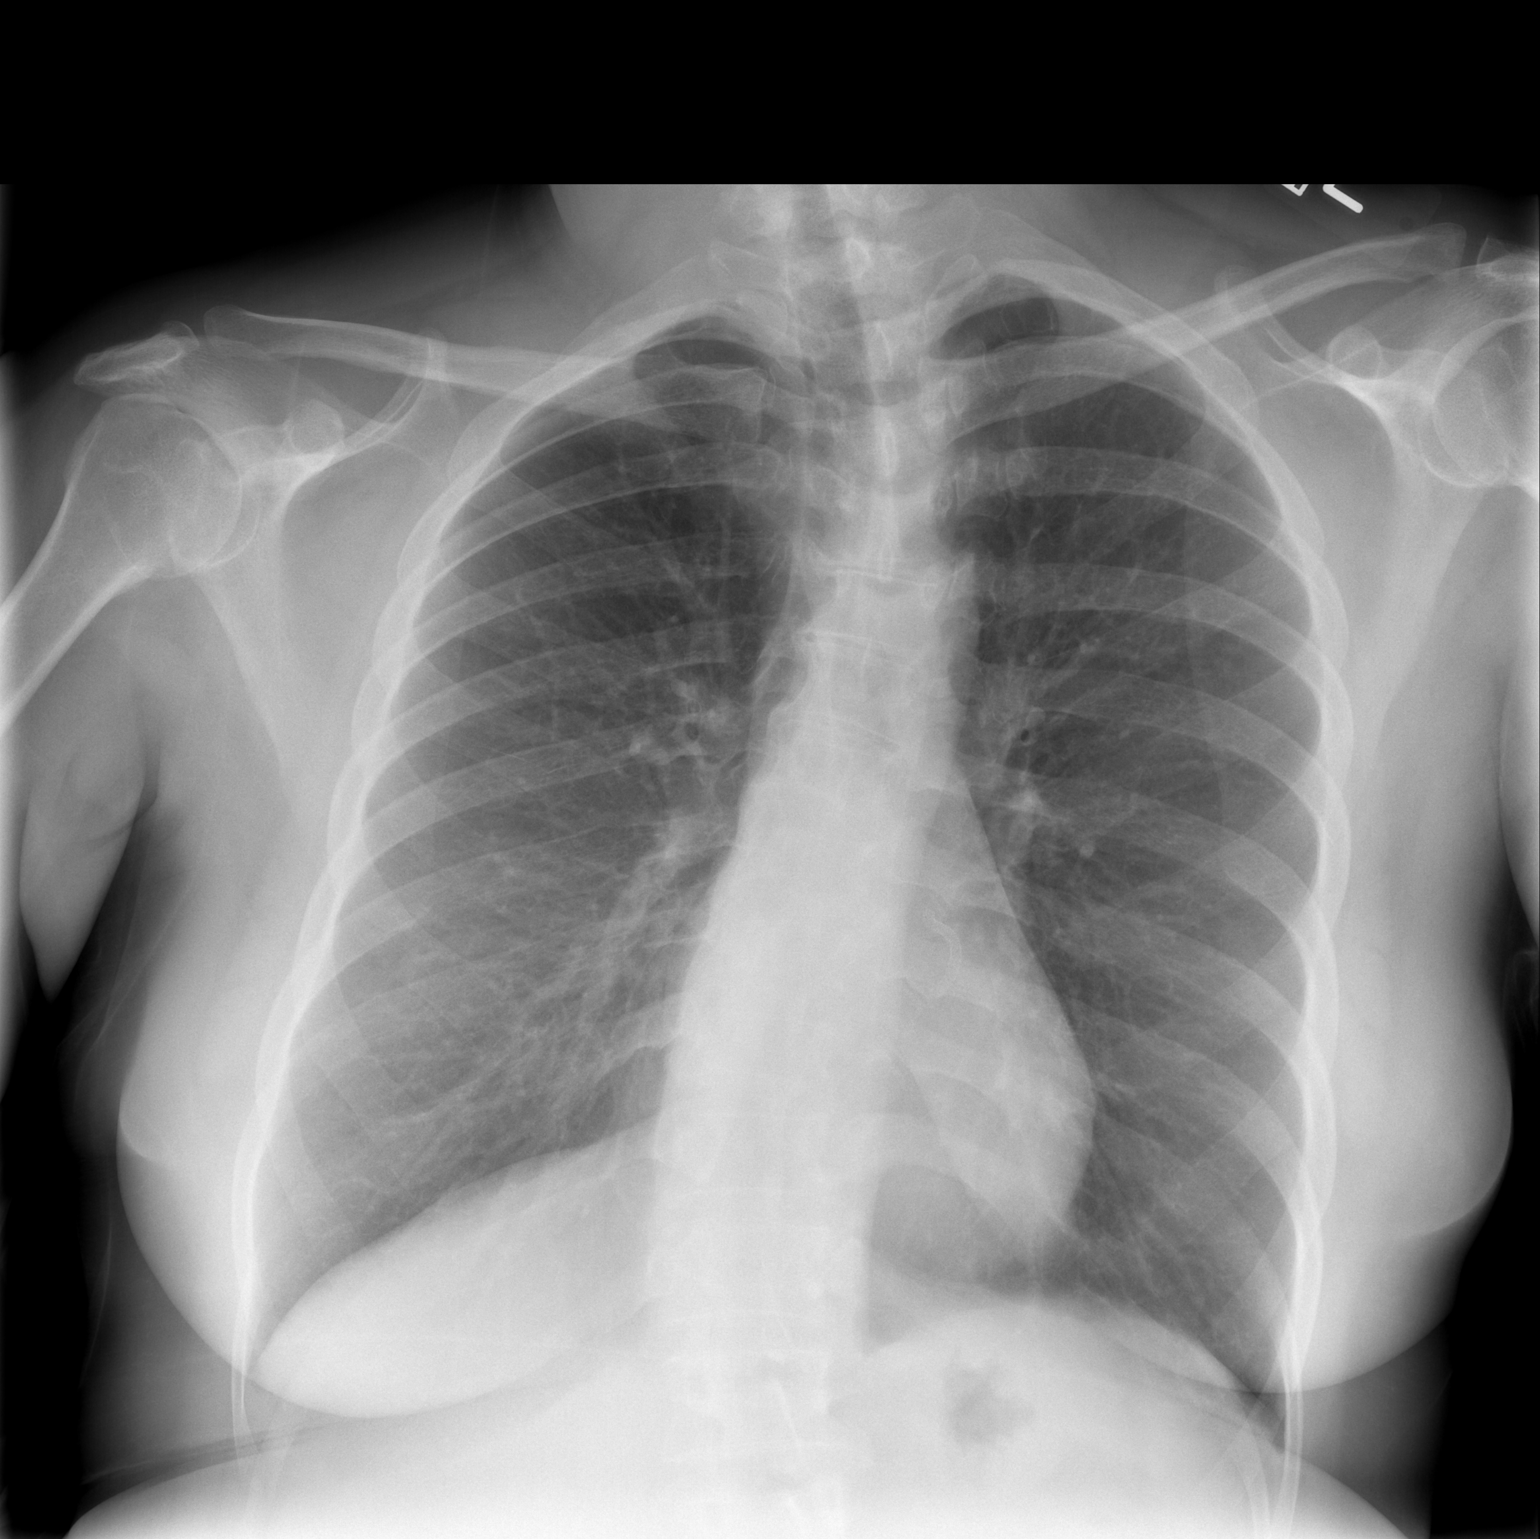

[w chest lat]
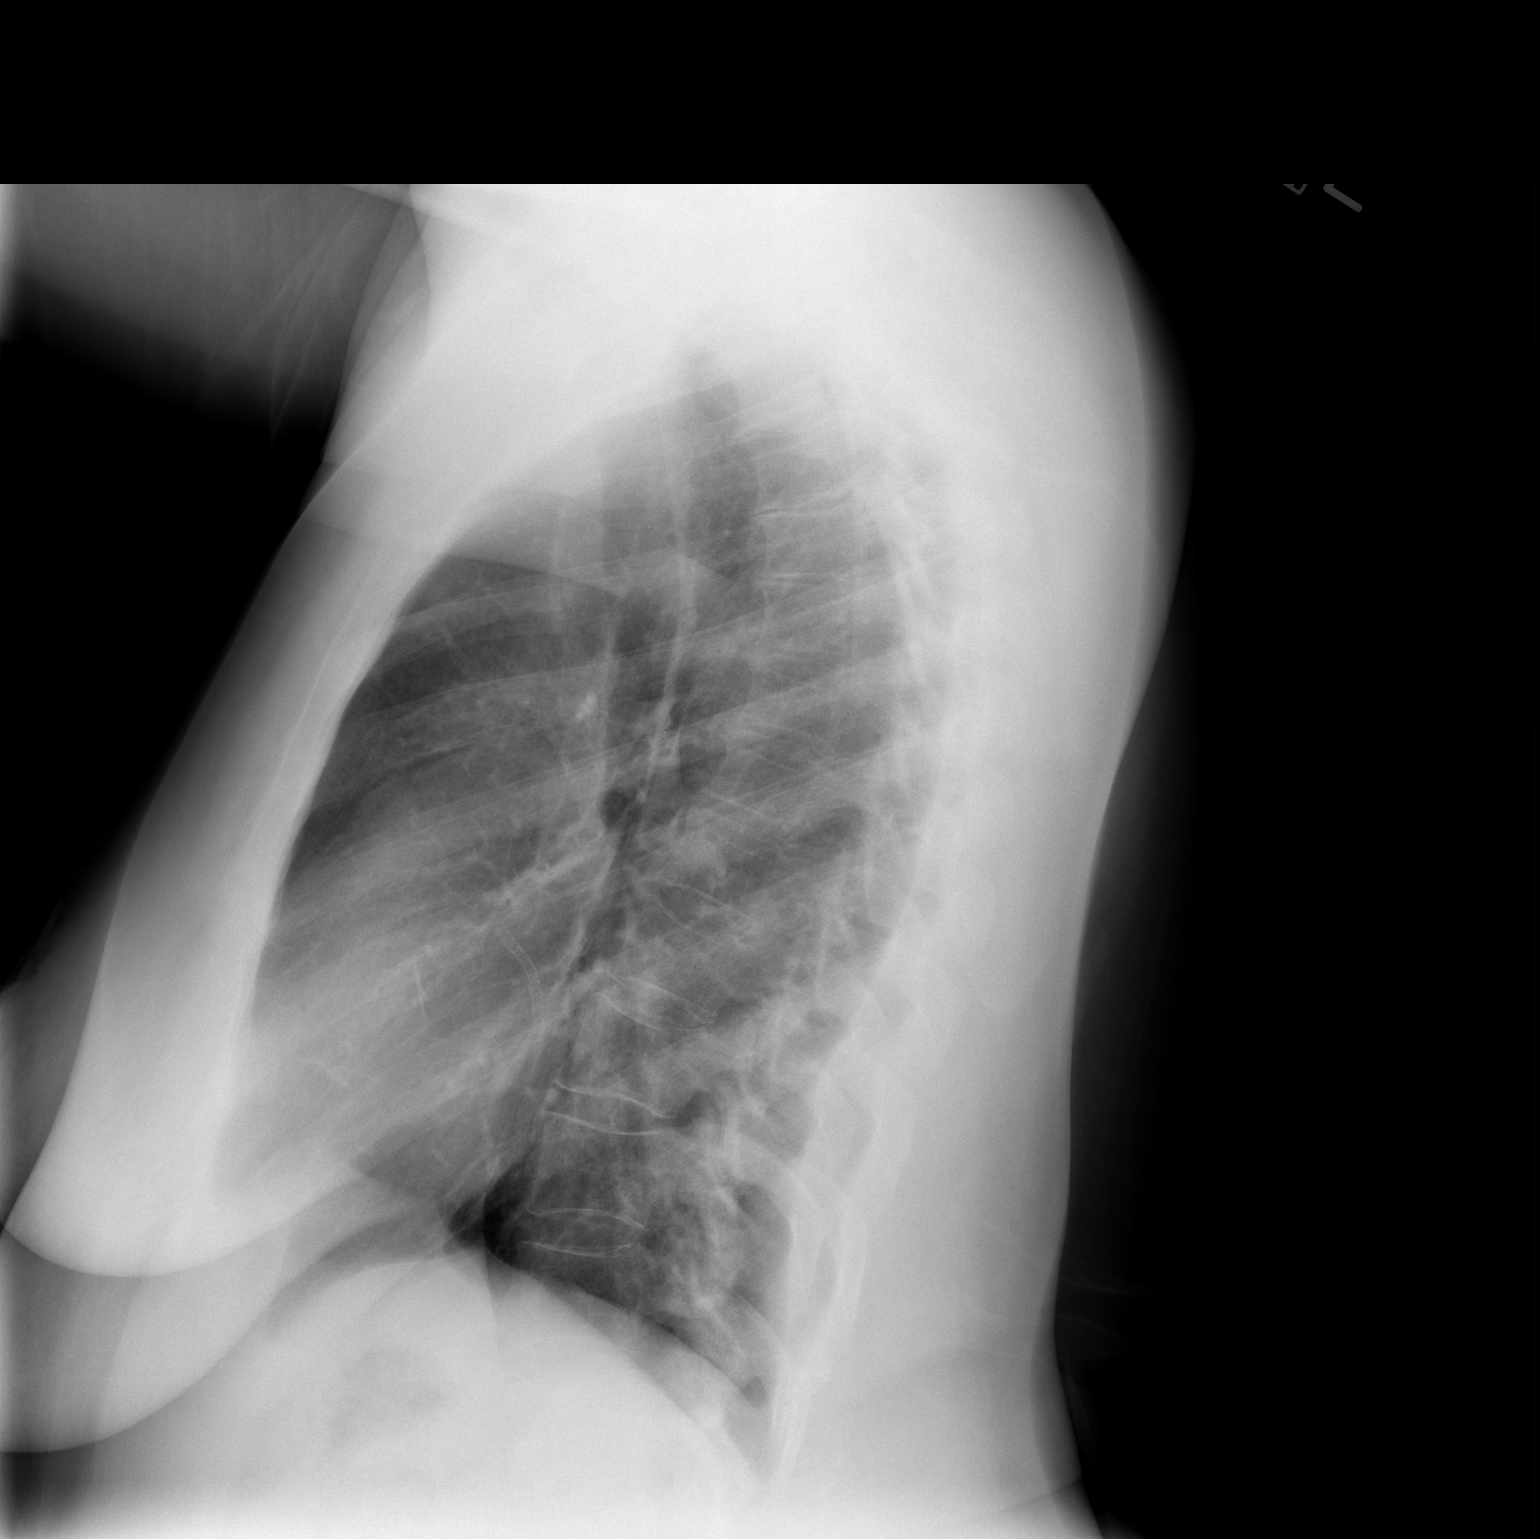

[2 of 2 positions shown; findings below may reference images not displayed]

FINDINGS: The heart size and mediastinal contours are within normal
limits.  Both lungs are clear.  There is a scoliosis deformity
affecting the thoracic and lumbar spine.
IMPRESSION: 1.  No acute cardiopulmonary abnormalities.

## 2014-06-25 ENCOUNTER — Other Ambulatory Visit: Payer: Self-pay | Admitting: Pharmacist

## 2014-06-25 ENCOUNTER — Telehealth: Payer: Self-pay | Admitting: Pharmacist

## 2014-06-25 DIAGNOSIS — G4700 Insomnia, unspecified: Secondary | ICD-10-CM

## 2014-06-25 NOTE — Telephone Encounter (Signed)
[name] is a 48 y.o. female who was reviewed by clinical pharmacy for medication adherence.   A/P:  Thank you for including me in [name]'s care.   Patient reports poor adherence due to taking care of son and grandson at home.  Barriers to adherence include: lack of time and belief in necessity  Empowered patient on the importance of medication adherence in helping her to stay healthy and care for her family  Advised patient to use adherence tools (alarms, calendars, pill boxes)  Patient agrees to contact pharmacy and refill all medications  Other recommendations: recommended OTC multivitamin per patient inquiry.  Patient advised to contact me if any further concerns arise.

## 2014-06-29 MED ORDER — ZOLPIDEM TARTRATE 5 MG PO TABS: 5.0000 mg | ORAL_TABLET | Freq: Every evening | ORAL | Status: DC | PRN

## 2014-07-18 ENCOUNTER — Emergency Department (HOSPITAL_COMMUNITY)
Admission: EM | Admit: 2014-07-18 | Discharge: 2014-07-18 | Disposition: A | Discharge status: Home / Self Care | Payer: Medicare Other | Attending: Emergency Medicine | Admitting: Emergency Medicine

## 2014-07-18 ENCOUNTER — Encounter (HOSPITAL_COMMUNITY): Payer: Self-pay | Admitting: Emergency Medicine

## 2014-07-18 DIAGNOSIS — K047 Periapical abscess without sinus: Secondary | ICD-10-CM | POA: Diagnosis not present

## 2014-07-18 DIAGNOSIS — E559 Vitamin D deficiency, unspecified: Secondary | ICD-10-CM | POA: Insufficient documentation

## 2014-07-18 DIAGNOSIS — Z9861 Coronary angioplasty status: Secondary | ICD-10-CM | POA: Insufficient documentation

## 2014-07-18 DIAGNOSIS — Z8742 Personal history of other diseases of the female genital tract: Secondary | ICD-10-CM | POA: Insufficient documentation

## 2014-07-18 DIAGNOSIS — I2510 Atherosclerotic heart disease of native coronary artery without angina pectoris: Secondary | ICD-10-CM | POA: Diagnosis not present

## 2014-07-18 DIAGNOSIS — Z720 Tobacco use: Secondary | ICD-10-CM | POA: Insufficient documentation

## 2014-07-18 DIAGNOSIS — Z880 Allergy status to penicillin: Secondary | ICD-10-CM | POA: Insufficient documentation

## 2014-07-18 DIAGNOSIS — K219 Gastro-esophageal reflux disease without esophagitis: Secondary | ICD-10-CM | POA: Insufficient documentation

## 2014-07-18 DIAGNOSIS — Z79899 Other long term (current) drug therapy: Secondary | ICD-10-CM | POA: Insufficient documentation

## 2014-07-18 DIAGNOSIS — I10 Essential (primary) hypertension: Secondary | ICD-10-CM | POA: Insufficient documentation

## 2014-07-18 DIAGNOSIS — F319 Bipolar disorder, unspecified: Secondary | ICD-10-CM | POA: Insufficient documentation

## 2014-07-18 DIAGNOSIS — Z7982 Long term (current) use of aspirin: Secondary | ICD-10-CM | POA: Insufficient documentation

## 2014-07-18 DIAGNOSIS — G809 Cerebral palsy, unspecified: Secondary | ICD-10-CM | POA: Diagnosis not present

## 2014-07-18 DIAGNOSIS — K029 Dental caries, unspecified: Secondary | ICD-10-CM | POA: Insufficient documentation

## 2014-07-18 DIAGNOSIS — E785 Hyperlipidemia, unspecified: Secondary | ICD-10-CM | POA: Insufficient documentation

## 2014-07-18 DIAGNOSIS — Z9851 Tubal ligation status: Secondary | ICD-10-CM | POA: Insufficient documentation

## 2014-07-18 DIAGNOSIS — R220 Localized swelling, mass and lump, head: Secondary | ICD-10-CM | POA: Diagnosis present

## 2014-07-18 LAB — I-STAT CHEM 8, ED
BUN: 8 mg/dL (ref 6–23)
Calcium, Ion: 1.14 mmol/L (ref 1.12–1.23)
Chloride: 106 mEq/L (ref 96–112)
Creatinine, Ser: 0.6 mg/dL (ref 0.50–1.10)
GLUCOSE: 90 mg/dL (ref 70–99)
HEMATOCRIT: 45 % (ref 36.0–46.0)
HEMOGLOBIN: 15.3 g/dL — AB (ref 12.0–15.0)
Potassium: 3.4 mEq/L — ABNORMAL LOW (ref 3.7–5.3)
Sodium: 141 mEq/L (ref 137–147)
TCO2: 22 mmol/L (ref 0–100)

## 2014-07-18 MED ORDER — CLINDAMYCIN HCL 300 MG PO CAPS
300.0000 mg | ORAL_CAPSULE | Freq: Once | ORAL | Status: AC
  Administered 2014-07-18: 300 mg via ORAL
  Filled 2014-07-18: qty 1

## 2014-07-18 MED ORDER — HYDROCODONE-ACETAMINOPHEN 5-325 MG PO TABS: 1.0000 | ORAL_TABLET | Freq: Four times a day (QID) | ORAL | Status: DC | PRN

## 2014-07-18 MED ORDER — CLINDAMYCIN HCL 300 MG PO CAPS: 300.0000 mg | ORAL_CAPSULE | Freq: Three times a day (TID) | ORAL | Status: DC

## 2014-07-18 NOTE — ED Notes (Signed)
Pt reports that she noticed swelling to the right side of her face last night that became worse earlier this morning. Pt reports that she has not eaten any new foods, but have shell fish yesterday. States that her face is itching and she also had bad teeth on the right side.

## 2014-07-18 NOTE — Discharge Instructions (Signed)

## 2014-07-18 NOTE — ED Provider Notes (Signed)
I saw and evaluated the patient, reviewed the resident's note and I agree with the findings and plan.   .Face to face Exam:  General:  Awake HEENT:  Atraumatic.  Dental caries.  Dental abscess. Resp:  Normal effort Abd:  Nondistended Neuro:No focal weakness   Nelia Shiobert L Willer Osorno, MD 07/18/14 1815

## 2014-07-18 NOTE — ED Notes (Signed)
Patient hooked up to 5 lead cardiac monitor.

## 2014-07-18 NOTE — ED Notes (Signed)
Patient had a H/a after eating pasta on Thursday evening, patient took some medicine and went to sleep.  Yesterday there was some swelling but nothing like today.  Today patient presents to the ED with major right jaw/facial swelling

## 2014-07-18 NOTE — ED Provider Notes (Signed)
CSN: [id number]     Arrival date & time 07/18/14  1506 History   First MD Initiated Contact with Patient 07/18/14 1630     Chief Complaint  Patient presents with  . Facial Swelling     (Consider location/radiation/quality/duration/timing/severity/associated sxs/prior Treatment) Patient is a 48 y.o. female presenting with general illness. The history is provided by the patient.  Illness Location:  Right side of face Quality:  Swelling Severity:  Mild Onset quality:  Gradual Duration:  2 days Timing:  Constant Progression:  Worsening Chronicity:  New Context:  Patient states that her right lower side of her face has been swelling for the past 2 days it is tender and has some itchiness Relieved by:  Rest Worsened by:  Touching it Ineffective treatments:  None tried Associated symptoms: no abdominal pain, no chest pain, no congestion, no cough, no diarrhea, no fatigue, no fever, no headaches, no nausea, no rash, no rhinorrhea, no shortness of breath, no vomiting and no wheezing   Risk factors:  Poor dentition   Past Medical History  Diagnosis Date  . Coronary artery disease 02/2008    a. s/p INF STEMI 5/09 tx with BMS to CFX;  b. Lexiscan Myoview 12/10 -  low risk, no ischemia.;   c. s/p NSTEMI 8/12: cath with LM 10%, LAD lum. irregs., oOM1 40%, mOM2 40%, mCFX stent occluded, RCA 50%, EF 45-50%; PCI - s/p DES to mCFX  d. Patent LCx stent, diffuse disease up to 50% in small nondominant RCA, 80% ostial stenosis in small OM3.   . Bipolar disorder      Followed by mental health, who prescribe her bipolar meds  . Cerebral palsy      with history of neuropathic pain  and spasticity,  requiring long-term pain medications  . Hypertension   . GERD (gastroesophageal reflux disease)   . Vitamin D deficiency 11/2009     patient treated with high-dose vitamin D weekly x3 months,  levels were rechecked and it was determined that the patient continued to require high dose repletion of vitamin D.   . History of pelvic mass     Found on CT abd/ pelvis (04/2008) - 13.3 cm cystic pelvis mass, possibly left ovarian origin. [facility]   Marland Kitchen. HLD (hyperlipidemia)    Past Surgical History  Procedure Laterality Date  . Total abdominal hysterectomy  03/2003     secondary to symptomatic fibroids.  . Cesarean section      x2  . Tubal ligation    .  elective abortion      x2  . Other surgical history       heel surgery, bilateral  . Coronary angioplasty with stent placement     Family History  Problem Relation Age of Onset  . Lung disease Mother 862    Died of respiratory failure  . Heart attack Father [phone]   History  Substance Use Topics  . Smoking status: Current Every Day Smoker -- 0.50 packs/day for 25 years    Types: Cigarettes  . Smokeless tobacco: Not on file  . Alcohol Use: 0.0 oz/week     Comment: occasionally   OB History   Grav Para Term Preterm Abortions TAB SAB Ect Mult Living                 Review of Systems  Constitutional: Negative for fever, activity change, appetite change and fatigue.  HENT: Positive for dental problem (Chronic poor dentition) and facial swelling.  Negative for congestion and rhinorrhea.   Eyes: Negative for discharge, redness and itching.  Respiratory: Negative for cough, shortness of breath and wheezing.   Cardiovascular: Negative for chest pain.  Gastrointestinal: Negative for nausea, vomiting, abdominal pain and diarrhea.  Genitourinary: Negative for dysuria and hematuria.  Musculoskeletal: Negative for back pain.  Skin: Negative for rash and wound.  Neurological: Negative for syncope and headaches.      Allergies  Penicillins  Home Medications   Prior to Admission medications   Medication Sig Start Date End Date Taking? Authorizing Provider  aspirin 81 MG tablet Take 81 mg by mouth daily.     Yes Historical Provider, MD  Cholecalciferol (D3-1000 PO) Take 1 capsule by mouth daily.   Yes Historical Provider, MD   diazepam (VALIUM) 5 MG tablet Take 5 mg by mouth every 6 (six) hours as needed for anxiety.   Yes Historical Provider, MD  metoprolol succinate (TOPROL-XL) 25 MG 24 hr tablet Take 12.5 mg by mouth daily.   Yes Historical Provider, MD  nitroGLYCERIN (NITROSTAT) 0.4 MG SL tablet Place 0.4 mg under the tongue every 5 (five) minutes as needed for chest pain.   Yes Historical Provider, MD  pantoprazole (PROTONIX) 40 MG tablet Take 40 mg by mouth 2 (two) times daily.   Yes Historical Provider, MD  simvastatin (ZOCOR) 40 MG tablet Take 40 mg by mouth every evening.   Yes Historical Provider, MD  traMADol (ULTRAM) 50 MG tablet Take 50 mg by mouth every 6 (six) hours as needed for moderate pain.   Yes Historical Provider, MD  zolpidem (AMBIEN) 5 MG tablet Take 5 mg by mouth at bedtime as needed for sleep.   Yes Historical Provider, MD  clindamycin (CLEOCIN) 300 MG capsule Take 1 capsule (300 mg total) by mouth 3 (three) times daily. 07/18/14   Pilar Jarvisoug Satvik Parco, MD  HYDROcodone-acetaminophen (NORCO) 5-325 MG per tablet Take 1 tablet by mouth every 6 (six) hours as needed for moderate pain. 07/18/14   Pilar Jarvisoug Shamarcus Hoheisel, MD   BP 164/108  Pulse 78  Temp(Src) 99 F (37.2 C) (Oral)  Resp 11  Ht 5' 4.5" (1.638 m)  Wt 178 lb (80.74 kg)  BMI 30.09 kg/m2  SpO2 100%  LMP 12/22/2007 Physical Exam  Constitutional: She is oriented to person, place, and time. She appears well-developed and well-nourished. No distress.  HENT:  Head: Normocephalic and atraumatic.  Mouth/Throat: Oropharynx is clear and moist. No oropharyngeal exudate.  Multiple dental caries Tender over right side of face and lower aspect externally as well as internally. Some mild edema of the right side of face No tenderness to palpation of the floor of the mouth Uvula midline no trismus  Eyes: Conjunctivae and EOM are normal. Pupils are equal, round, and reactive to light. Right eye exhibits no discharge. Left eye exhibits no discharge. No scleral  icterus.  Neck: Normal range of motion. Neck supple.  Cardiovascular: Normal rate, regular rhythm and normal heart sounds.   No murmur heard. Pulmonary/Chest: Effort normal and breath sounds normal. No respiratory distress. She has no wheezes. She has no rales.  Abdominal: Soft. She exhibits no distension and no mass. There is no tenderness.  Neurological: She is alert and oriented to person, place, and time. She exhibits normal muscle tone. Coordination normal.  Skin: Skin is warm. No rash noted. She is not diaphoretic.    ED Course  Procedures (including critical care time) Labs Review Labs Reviewed  I-STAT CHEM 8, ED - Abnormal; Notable  for the following:    Potassium 3.4 (*)    Hemoglobin 15.3 (*)    All other components within normal limits    Imaging Review No results found.   EKG Interpretation None      MDM   MDM: 48 year old female comes in with right-sided facial swelling. Has very poor dentition her right side. These location exam likely dental abscess. Patient afebrile well-appearing nontoxic no sirs. Attempted needle aspiration with mild return. Given dose of clindamycin and we'll give prescription and to followup with dentistry. Given course Norco for pain. Return to emergency department if febrile or problems with secretions trouble breathing. Discharged.  Final diagnoses:  Dental abscess    New Prescriptions   CLINDAMYCIN (CLEOCIN) 300 MG CAPSULE    Take 1 capsule (300 mg total) by mouth 3 (three) times daily.   HYDROCODONE-ACETAMINOPHEN (NORCO) 5-325 MG PER TABLET    Take 1 tablet by mouth every 6 (six) hours as needed for moderate pain.   Baldo Ash, DDS [facility] [address] [place] [address] [phone]  Schedule an appointment as soon as possible for a visit   Discharged  Pilar Jarvis, MD 07/18/14 1739

## 2014-07-18 NOTE — ED Notes (Signed)
Dr Beaton at bedside 

## 2014-07-18 NOTE — ED Notes (Signed)
Dr. Sharol GivenBritalik at bedside

## 2014-08-25 ENCOUNTER — Encounter: Payer: Medicare Other | Admitting: Internal Medicine

## 2014-09-16 ENCOUNTER — Encounter (HOSPITAL_COMMUNITY): Payer: Self-pay | Admitting: Cardiology

## 2014-09-22 ENCOUNTER — Encounter: Payer: Medicare Other | Admitting: Internal Medicine

## 2014-10-15 ENCOUNTER — Encounter: Payer: Medicare Other | Admitting: Internal Medicine

## 2014-10-29 ENCOUNTER — Encounter: Payer: Self-pay | Admitting: Internal Medicine

## 2014-11-17 ENCOUNTER — Encounter: Payer: Self-pay | Admitting: Internal Medicine

## 2014-12-08 ENCOUNTER — Encounter: Payer: Self-pay | Admitting: Internal Medicine

## 2014-12-08 ENCOUNTER — Ambulatory Visit (INDEPENDENT_AMBULATORY_CARE_PROVIDER_SITE_OTHER): Payer: Medicare Other | Admitting: Internal Medicine

## 2014-12-08 VITALS — BP 158/88 | HR 56 | Temp 97.6°F | Wt 173.8 lb

## 2014-12-08 DIAGNOSIS — I2510 Atherosclerotic heart disease of native coronary artery without angina pectoris: Secondary | ICD-10-CM

## 2014-12-08 DIAGNOSIS — G809 Cerebral palsy, unspecified: Secondary | ICD-10-CM | POA: Diagnosis not present

## 2014-12-08 DIAGNOSIS — I10 Essential (primary) hypertension: Secondary | ICD-10-CM

## 2014-12-08 DIAGNOSIS — Z9114 Patient's other noncompliance with medication regimen: Secondary | ICD-10-CM

## 2014-12-08 DIAGNOSIS — G4700 Insomnia, unspecified: Secondary | ICD-10-CM | POA: Diagnosis not present

## 2014-12-08 DIAGNOSIS — F17210 Nicotine dependence, cigarettes, uncomplicated: Secondary | ICD-10-CM

## 2014-12-08 DIAGNOSIS — Z720 Tobacco use: Secondary | ICD-10-CM

## 2014-12-08 MED ORDER — DIAZEPAM 5 MG PO TABS: 5.0000 mg | ORAL_TABLET | Freq: Four times a day (QID) | ORAL | Status: DC | PRN

## 2014-12-08 MED ORDER — SIMVASTATIN 40 MG PO TABS: 40.0000 mg | ORAL_TABLET | Freq: Every evening | ORAL | Status: DC

## 2014-12-08 MED ORDER — METOPROLOL SUCCINATE ER 25 MG PO TB24: 12.5000 mg | ORAL_TABLET | Freq: Every day | ORAL | Status: DC

## 2014-12-08 MED ORDER — PANTOPRAZOLE SODIUM 40 MG PO TBEC: 40.0000 mg | DELAYED_RELEASE_TABLET | Freq: Every day | ORAL | Status: DC

## 2014-12-08 MED ORDER — CALCIUM-VITAMIN D 250-125 MG-UNIT PO TABS: 1.0000 | ORAL_TABLET | Freq: Every day | ORAL | Status: DC

## 2014-12-08 MED ORDER — ZOLPIDEM TARTRATE 5 MG PO TABS: 5.0000 mg | ORAL_TABLET | Freq: Every evening | ORAL | Status: DC | PRN

## 2014-12-08 NOTE — Patient Instructions (Addendum)
I have restarted your Ambien to help you sleep. I have also provided a prescription for the valium to help with your Spasms. We will like to see you in 1-2 months to check your blood pressure. Please take your blood pressure medications and your Cholesterol medications. It is also important you stop smoking cigarettes.

## 2014-12-09 NOTE — Assessment & Plan Note (Signed)
No chest pain. Pt is supposed to be on aspirin, a statin, and a BB. She is not compliant with these meds and still smokes. Counselled pt seriously about risk for another heart attack, she voiced understanding, and promised to do better.

## 2014-12-09 NOTE — Assessment & Plan Note (Signed)
Still smoking. Not ready to quit. Declined materials to help quit today.

## 2014-12-09 NOTE — Progress Notes (Signed)
Patient ID: [name], female   DOB: 04/12/1966, 49 y.o.   MRN: [id number]   Subjective:   Patient ID: Victoria CheneyDonna G [name] female   DOB: 06/17/1966 49 y.o.   MRN: [id number]  HPI: [name] is a 49 y.o. with PMH listed below presented today for clinic follow up visit. Pt says she has not been making her appointments because of transportation issues.   Past Medical History  Diagnosis Date  . Coronary artery disease 02/2008    a. s/p INF STEMI 5/09 tx with BMS to CFX;  b. Lexiscan Myoview 12/10 -  low risk, no ischemia.;   c. s/p NSTEMI 8/12: cath with LM 10%, LAD lum. irregs., oOM1 40%, mOM2 40%, mCFX stent occluded, RCA 50%, EF 45-50%; PCI - s/p DES to mCFX  d. Patent LCx stent, diffuse disease up to 50% in small nondominant RCA, 80% ostial stenosis in small OM3.   . Bipolar disorder      Followed by mental health, who prescribe her bipolar meds  . Cerebral palsy      with history of neuropathic pain  and spasticity,  requiring long-term pain medications  . Hypertension   . GERD (gastroesophageal reflux disease)   . Vitamin D deficiency 11/2009     patient treated with high-dose vitamin D weekly x3 months,  levels were rechecked and it was determined that the patient continued to require high dose repletion of vitamin D.  . History of pelvic mass     Found on CT abd/ pelvis (04/2008) - 13.3 cm cystic pelvis mass, possibly left ovarian origin. [facility]   Marland Kitchen. HLD (hyperlipidemia)    Current Outpatient Prescriptions  Medication Sig Dispense Refill  . aspirin 81 MG tablet Take 81 mg by mouth daily.      . calcium-vitamin D (OSCAL) 250-125 MG-UNIT per tablet Take 1 tablet by mouth daily. 30 tablet 5  . Cholecalciferol (D3-1000 PO) Take 1 capsule by mouth daily.    . diazepam (VALIUM) 5 MG tablet Take 1 tablet (5 mg total) by mouth every 6 (six) hours as needed for anxiety. 30 tablet 2  . metoprolol succinate (TOPROL-XL) 25 MG 24 hr tablet Take 0.5 tablets (12.5 mg  total) by mouth daily. 30 tablet 6  . nitroGLYCERIN (NITROSTAT) 0.4 MG SL tablet Place 0.4 mg under the tongue every 5 (five) minutes as needed for chest pain.    . pantoprazole (PROTONIX) 40 MG tablet Take 1 tablet (40 mg total) by mouth daily. 30 tablet 3  . simvastatin (ZOCOR) 40 MG tablet Take 1 tablet (40 mg total) by mouth every evening. 30 tablet 6  . zolpidem (AMBIEN) 5 MG tablet Take 1 tablet (5 mg total) by mouth at bedtime as needed for sleep. 30 tablet 1   No current facility-administered medications for this visit.   Family History  Problem Relation Age of Onset  . Lung disease Mother 8862    Died of respiratory failure  . Heart attack Father 4856   History   Social History  . Marital Status: Divorced    Spouse Name: N/A  . Number of Children: N/A  . Years of Education: N/A   Social History Main Topics  . Smoking status: Current Every Day Smoker -- 0.50 packs/day for 25 years    Types: Cigarettes  . Smokeless tobacco: Not on file     Comment: Sometimes less.  . Alcohol Use: 0.0 oz/week    0 Standard drinks or  equivalent per week     Comment: Rarely.  . Drug Use: No  . Sexual Activity: Not Currently   Other Topics Concern  . None   Social History Narrative   Review of Systems: CONSTITUTIONAL- No Fever, weightloss, night sweat or change in appetite. HEAD- No Headache or dizziness. RESPIRATORY- No Cough or SOB. CARDIAC- No chest pain. GI- No nausea, vomiting, diarrhoea, constipation, abd pain. [facility]- Denies depression or anxiety.  Objective:  Physical Exam: Filed Vitals:   12/08/14 1542  BP: 158/88  Pulse: 56  Temp: 97.6 F (36.4 C)  TempSrc: Oral  Weight: 173 lb 12.8 oz (78.835 kg)  SpO2: 100%   GENERAL- alert, co-operative, appears as stated age, not in any distress, sitting in wheel chair. HEENT- Atraumatic, normocephalic. CARDIAC- RRR, no murmurs, rubs or gallops. RESP- Moving equal volumes of air, no wheezes or crackles. ABDOMEN- Soft,  nontender, bowel sounds present. NEURO- No obvious Cr N abnormality.  EXTREMITIES- pulse 2+, symmetric, no pedal edema. SKIN- Warm, dry, No rash or lesion. PSYCH- Normal mood and affect, appropriate thought content and speech.  Assessment & Plan:  The patient's case and plan of care was discussed with attending physician, Dr. Rogelia Boga.  Please see problem based charting for assessment and plan.

## 2014-12-09 NOTE — Assessment & Plan Note (Signed)
BP Readings from Last 3 Encounters:  12/08/14 158/88  07/18/14 157/97  02/20/14 134/88    Lab Results  Component Value Date   NA 141 07/18/2014   K 3.4* 07/18/2014   CREATININE 0.60 07/18/2014    Assessment: Blood pressure control:  Uncontrolled Progress toward BP goal:   Not at goal Comments: Pt admits to non compliance, specifically asks that labs not checked today. As she knows they will be abnormal.  Plan: Medications:  Cont with Metoprolol XR 12.5 daily,  She also has a hx of CAD. Still smokes cigs, some half hearted attempts to quit.  Educational resources provided:   Self management tools provided:   Other plans: Will check BMEt considering Prior low K. Pt agreed, but left without getting labwork done.

## 2014-12-09 NOTE — Assessment & Plan Note (Signed)
Prescription for ambien- 5mg  QHS given.

## 2014-12-09 NOTE — Assessment & Plan Note (Addendum)
Pt has been having cramps related to her CP. She says ibuprofen total of 800mg  daily which she gets OTC, helps with the pain. She has tried Flexeril, baclofen in the past, but she says though they work they make her extremely drowsy. She was started on Valium last clinic visit, this helps with the spasm, but this drug for some reason exacerbates her insomnia. She was previously on Ambien which i stopped as i was placing her on Valium. Will restart Ambien and represcribe Valium, as this helps with her spasm. Concern for GI bleed with continued daily use of NSAids.  Plan- Valium- 5mg  Q6H- #60 tablets - Ambien- 5mg  QHS - Consideration for physical therapy and rehab next visit.

## 2014-12-11 NOTE — Progress Notes (Signed)
Internal Medicine Clinic Attending  Case discussed with Dr. Emokpae soon after the resident saw the patient.  We reviewed the resident's history and exam and pertinent patient test results.  I agree with the assessment, diagnosis, and plan of care documented in the resident's note. 

## 2015-03-23 ENCOUNTER — Emergency Department (HOSPITAL_COMMUNITY): Payer: Medicare Other

## 2015-03-23 ENCOUNTER — Emergency Department (HOSPITAL_COMMUNITY)
Admission: EM | Admit: 2015-03-23 | Discharge: 2015-03-23 | Disposition: A | Discharge status: Home / Self Care | Payer: Medicare Other | Attending: Emergency Medicine | Admitting: Emergency Medicine

## 2015-03-23 ENCOUNTER — Encounter (HOSPITAL_COMMUNITY): Payer: Self-pay | Admitting: *Deleted

## 2015-03-23 DIAGNOSIS — K219 Gastro-esophageal reflux disease without esophagitis: Secondary | ICD-10-CM | POA: Diagnosis not present

## 2015-03-23 DIAGNOSIS — M546 Pain in thoracic spine: Secondary | ICD-10-CM | POA: Insufficient documentation

## 2015-03-23 DIAGNOSIS — Z9861 Coronary angioplasty status: Secondary | ICD-10-CM | POA: Insufficient documentation

## 2015-03-23 DIAGNOSIS — I2510 Atherosclerotic heart disease of native coronary artery without angina pectoris: Secondary | ICD-10-CM | POA: Insufficient documentation

## 2015-03-23 DIAGNOSIS — Z8669 Personal history of other diseases of the nervous system and sense organs: Secondary | ICD-10-CM | POA: Diagnosis not present

## 2015-03-23 DIAGNOSIS — Z8659 Personal history of other mental and behavioral disorders: Secondary | ICD-10-CM | POA: Insufficient documentation

## 2015-03-23 DIAGNOSIS — Z880 Allergy status to penicillin: Secondary | ICD-10-CM | POA: Diagnosis not present

## 2015-03-23 DIAGNOSIS — Z79899 Other long term (current) drug therapy: Secondary | ICD-10-CM | POA: Diagnosis not present

## 2015-03-23 DIAGNOSIS — R52 Pain, unspecified: Secondary | ICD-10-CM

## 2015-03-23 DIAGNOSIS — I10 Essential (primary) hypertension: Secondary | ICD-10-CM | POA: Insufficient documentation

## 2015-03-23 DIAGNOSIS — R519 Headache, unspecified: Secondary | ICD-10-CM

## 2015-03-23 DIAGNOSIS — Z720 Tobacco use: Secondary | ICD-10-CM | POA: Diagnosis not present

## 2015-03-23 DIAGNOSIS — Z7982 Long term (current) use of aspirin: Secondary | ICD-10-CM | POA: Insufficient documentation

## 2015-03-23 DIAGNOSIS — E785 Hyperlipidemia, unspecified: Secondary | ICD-10-CM | POA: Insufficient documentation

## 2015-03-23 DIAGNOSIS — R51 Headache: Secondary | ICD-10-CM | POA: Diagnosis not present

## 2015-03-23 DIAGNOSIS — R110 Nausea: Secondary | ICD-10-CM | POA: Diagnosis not present

## 2015-03-23 DIAGNOSIS — Z9889 Other specified postprocedural states: Secondary | ICD-10-CM | POA: Diagnosis not present

## 2015-03-23 LAB — I-STAT CHEM 8, ED
BUN: 15 mg/dL (ref 6–20)
Calcium, Ion: 1.15 mmol/L (ref 1.12–1.23)
Chloride: 104 mmol/L (ref 101–111)
Creatinine, Ser: 0.5 mg/dL (ref 0.44–1.00)
GLUCOSE: 98 mg/dL (ref 65–99)
HEMATOCRIT: 42 % (ref 36.0–46.0)
HEMOGLOBIN: 14.3 g/dL (ref 12.0–15.0)
Potassium: 4 mmol/L (ref 3.5–5.1)
SODIUM: 139 mmol/L (ref 135–145)
TCO2: 22 mmol/L (ref 0–100)

## 2015-03-23 LAB — D-DIMER, QUANTITATIVE: D-Dimer, Quant: 0.35 ug/mL-FEU (ref 0.00–0.48)

## 2015-03-23 LAB — CBC WITH DIFFERENTIAL/PLATELET
BASOS ABS: 0 10*3/uL (ref 0.0–0.1)
BASOS PCT: 0 % (ref 0–1)
EOS PCT: 1 % (ref 0–5)
Eosinophils Absolute: 0.1 10*3/uL (ref 0.0–0.7)
HCT: 38 % (ref 36.0–46.0)
Hemoglobin: 12.9 g/dL (ref 12.0–15.0)
Lymphocytes Relative: 54 % — ABNORMAL HIGH (ref 12–46)
Lymphs Abs: 4.6 10*3/uL — ABNORMAL HIGH (ref 0.7–4.0)
MCH: 30.3 pg (ref 26.0–34.0)
MCHC: 33.9 g/dL (ref 30.0–36.0)
MCV: 89.2 fL (ref 78.0–100.0)
MONO ABS: 0.6 10*3/uL (ref 0.1–1.0)
Monocytes Relative: 7 % (ref 3–12)
NEUTROS ABS: 3.2 10*3/uL (ref 1.7–7.7)
Neutrophils Relative %: 38 % — ABNORMAL LOW (ref 43–77)
Platelets: 229 10*3/uL (ref 150–400)
RBC: 4.26 MIL/uL (ref 3.87–5.11)
RDW: 14.2 % (ref 11.5–15.5)
WBC: 8.5 10*3/uL (ref 4.0–10.5)

## 2015-03-23 LAB — I-STAT TROPONIN, ED: Troponin i, poc: 0.01 ng/mL (ref 0.00–0.08)

## 2015-03-23 MED ORDER — METOPROLOL SUCCINATE ER 25 MG PO TB24: 12.5000 mg | ORAL_TABLET | Freq: Every day | ORAL | Status: DC

## 2015-03-23 MED ORDER — DIPHENHYDRAMINE HCL 25 MG PO CAPS: 25.0000 mg | ORAL_CAPSULE | Freq: Once | ORAL | Status: DC

## 2015-03-23 MED ORDER — ONDANSETRON HCL 4 MG PO TABS: 4.0000 mg | ORAL_TABLET | Freq: Four times a day (QID) | ORAL | Status: DC

## 2015-03-23 MED ORDER — PROCHLORPERAZINE MALEATE 5 MG PO TABS
5.0000 mg | ORAL_TABLET | Freq: Once | ORAL | Status: DC
  Filled 2015-03-23: qty 1

## 2015-03-23 MED ORDER — DIPHENHYDRAMINE HCL 50 MG/ML IJ SOLN
25.0000 mg | Freq: Once | INTRAMUSCULAR | Status: AC
  Administered 2015-03-23: 25 mg via INTRAVENOUS
  Filled 2015-03-23: qty 1

## 2015-03-23 MED ORDER — KETOROLAC TROMETHAMINE 30 MG/ML IJ SOLN
30.0000 mg | Freq: Once | INTRAMUSCULAR | Status: AC
Start: 2015-03-23 — End: 2015-03-23
  Administered 2015-03-23: 30 mg via INTRAVENOUS
  Filled 2015-03-23: qty 1

## 2015-03-23 MED ORDER — PROCHLORPERAZINE EDISYLATE 5 MG/ML IJ SOLN: 10.0000 mg | Freq: Four times a day (QID) | INTRAMUSCULAR | Status: DC | PRN

## 2015-03-23 MED ORDER — PROCHLORPERAZINE EDISYLATE 5 MG/ML IJ SOLN
5.0000 mg | Freq: Once | INTRAMUSCULAR | Status: AC
  Administered 2015-03-23: 5 mg via INTRAVENOUS

## 2015-03-23 MED ORDER — PROCHLORPERAZINE EDISYLATE 5 MG/ML IJ SOLN
10.0000 mg | Freq: Once | INTRAMUSCULAR | Status: DC
  Filled 2015-03-23: qty 2

## 2015-03-23 NOTE — ED Notes (Addendum)
Pt c/o nausea x4 days with pain in upper back, between shoulder blades.  Unknown if pain is cardiac related or GERD per patient. Per EMS pt was hypertensive at 192/100 on their arrival. Pt's bp at this time 141/73. Pt has been out of metoprolol x 2 days

## 2015-03-23 NOTE — ED Notes (Addendum)
Pt reports intermittent headache at this time; requesting nausea medication; md made aware

## 2015-03-23 NOTE — ED Notes (Signed)
Pt to ED via [facility] c/o upper back pain x 2 days and nausea x 4 days. Hx of MI. Pt noted to be hypertensive on arrival at 192/100, last EMS bp 166/86. Pt given 4mg  zofran enroute. Pt reports being out of Metoprolol x 2 days.

## 2015-03-23 NOTE — ED Notes (Signed)
Patient transported to CT 

## 2015-03-23 NOTE — Discharge Instructions (Signed)
Please read attached information, please contact her primary care provider inform them of your visit today. The schedule follow-up appointment in 3 days for reevaluation. If you're unable to see them as call [facility] wellness for further evaluation. Please return immediately if new or worsening signs or symptoms present.

## 2015-03-23 NOTE — ED Provider Notes (Cosign Needed)
CSN: [id number]     Arrival date & time 03/23/15  0400 History   First MD Initiated Contact with Patient 03/23/15 [phone]     Chief Complaint  Patient presents with  . Nausea    HPI   49 year old female presents today with complaints of headache. Patient reports for the last 4 days she's had frontal headache. She notes that this pain is a throbbing sensation, no radiation pain, no focal neurological deficits, fever, neck stiffness, changes in vision, smell, taste, nausea, vomiting or any other concerning signs or symptoms. She denies trauma to the head. Notes that ibuprofen has not completely resolved the pain. She notes been consistent not made worse by anything. Patient notes that early morning she was concerned that the headache was not dissipating, and is also having some nausea no vomiting and was found to have a blood pressure 192/100 by EMS. She reports that she's also had back pain the last week. She notes this pain is in her thoracic back, feels like someone is "kicking her". She notes the pain very brief lasting approximately one second with total resolution. Notes the pain is made worse with palpation,  flexion of her back; this pain is not associated with nausea, vomiting, shortness of breath, chest pain. Patient reports that she is out of her metoprolol and has not taken in 2 days.     Past Medical History  Diagnosis Date  . Coronary artery disease 02/2008    a. s/p INF STEMI 5/09 tx with BMS to CFX;  b. Lexiscan Myoview 12/10 -  low risk, no ischemia.;   c. s/p NSTEMI 8/12: cath with LM 10%, LAD lum. irregs., oOM1 40%, mOM2 40%, mCFX stent occluded, RCA 50%, EF 45-50%; PCI - s/p DES to mCFX  d. Patent LCx stent, diffuse disease up to 50% in small nondominant RCA, 80% ostial stenosis in small OM3.   . Bipolar disorder      Followed by mental health, who prescribe her bipolar meds  . Cerebral palsy      with history of neuropathic pain  and spasticity,  requiring long-term pain  medications  . Hypertension   . GERD (gastroesophageal reflux disease)   . Vitamin D deficiency 11/2009     patient treated with high-dose vitamin D weekly x3 months,  levels were rechecked and it was determined that the patient continued to require high dose repletion of vitamin D.  . History of pelvic mass     Found on CT abd/ pelvis (04/2008) - 13.3 cm cystic pelvis mass, possibly left ovarian origin. To be followed at [facility].   Marland Kitchen HLD (hyperlipidemia)    Past Surgical History  Procedure Laterality Date  . Total abdominal hysterectomy  03/2003     secondary to symptomatic fibroids.  . Cesarean section      x2  . Tubal ligation    .  elective abortion      x2  . Other surgical history       heel surgery, bilateral  . Coronary angioplasty with stent placement    . Left heart catheterization with coronary angiogram N/A 10/06/2011    Procedure: LEFT HEART CATHETERIZATION WITH CORONARY ANGIOGRAM;  Surgeon: Laurey Morale, MD;  Location: [facility] CATH LAB;  Service: Cardiovascular;  Laterality: N/A;   Family History  Problem Relation Age of Onset  . Lung disease Mother 57    Died of respiratory failure  . Heart attack Father 53   History  Substance Use Topics  .  Smoking status: Current Every Day Smoker -- 0.50 packs/day for 25 years    Types: Cigarettes  . Smokeless tobacco: Not on file     Comment: Sometimes less.  . Alcohol Use: 0.0 oz/week    0 Standard drinks or equivalent per week     Comment: Rarely.   OB History    No data available     Review of Systems  All other systems reviewed and are negative.     Allergies  Penicillins  Home Medications   Prior to Admission medications   Medication Sig Start Date End Date Taking? Authorizing Provider  aspirin 81 MG tablet Take 81 mg by mouth daily.     Yes Historical Provider, MD  diazepam (VALIUM) 5 MG tablet Take 1 tablet (5 mg total) by mouth every 6 (six) hours as needed for anxiety. 12/08/14  Yes Ejiroghene Wendall Stade, MD   nitroGLYCERIN (NITROSTAT) 0.4 MG SL tablet Place 0.4 mg under the tongue every 5 (five) minutes as needed for chest pain.   Yes Historical Provider, MD  pantoprazole (PROTONIX) 40 MG tablet Take 1 tablet (40 mg total) by mouth daily. Patient taking differently: Take 40 mg by mouth 2 (two) times daily.  12/08/14  Yes Ejiroghene E Emokpae, MD  simvastatin (ZOCOR) 40 MG tablet Take 1 tablet (40 mg total) by mouth every evening. 12/08/14  Yes Ejiroghene E Emokpae, MD  zolpidem (AMBIEN) 5 MG tablet Take 1 tablet (5 mg total) by mouth at bedtime as needed for sleep. 12/08/14  Yes Ejiroghene Wendall Stade, MD  metoprolol succinate (TOPROL-XL) 25 MG 24 hr tablet Take 0.5 tablets (12.5 mg total) by mouth daily. 03/23/15   Tinnie Gens Naimah Yingst, PA-C  ondansetron (ZOFRAN) 4 MG tablet Take 1 tablet (4 mg total) by mouth every 6 (six) hours. 03/23/15   Eyvonne Mechanic, PA-C   BP 123/80 mmHg  Pulse 51  Temp(Src) 98.2 F (36.8 C)  Resp 9  SpO2 97%  LMP 12/22/2007 Physical Exam  Constitutional: She is oriented to person, place, and time. She appears well-developed and well-nourished.  HENT:  Head: Normocephalic and atraumatic.  Eyes: Pupils are equal, round, and reactive to light.  Neck: Normal range of motion. Neck supple. No JVD present. No tracheal deviation present. No thyromegaly present.  Cardiovascular: Regular rhythm, normal heart sounds and intact distal pulses.  Exam reveals no gallop and no friction rub.   No murmur heard. Pulmonary/Chest: Effort normal and breath sounds normal. No stridor. No respiratory distress. She has no wheezes. She has no rales. She exhibits no tenderness.  Abdominal: Soft. There is no tenderness.  Musculoskeletal: Normal range of motion.  Lymphadenopathy:    She has no cervical adenopathy.  Neurological: She is alert and oriented to person, place, and time. She has normal strength. No cranial nerve deficit or sensory deficit. GCS eye subscore is 4. GCS verbal subscore is 5. GCS motor  subscore is 6.  Skin: Skin is warm and dry.  Psychiatric: She has a normal mood and affect. Her behavior is normal. Judgment and thought content normal.  Nursing note and vitals reviewed.   ED Course  Procedures (including critical care time) Labs Review Labs Reviewed  CBC WITH DIFFERENTIAL/PLATELET - Abnormal; Notable for the following:    Neutrophils Relative % 38 (*)    Lymphocytes Relative 54 (*)    Lymphs Abs 4.6 (*)    All other components within normal limits  D-DIMER, QUANTITATIVE (NOT AT [facility])  I-STAT CHEM 8, ED  I-STAT  TROPOININ, ED    Imaging Review Dg Chest 2 View  03/23/2015   CLINICAL DATA:  Hypertension. Burning sensation in chest. Cerebral palsy.  EXAM: CHEST  2 VIEW  COMPARISON:  Are 7 scoliosis upper thoracic spine convex towards the left. Coronary artery vascular calcification or stent. Normal heart size and pulmonary vascularity. No focal airspace disease or consolidation in the lungs. No blunting of costophrenic angles. No pneumothorax. Mediastinal contours appear intact.  FINDINGS: The heart size and mediastinal contours are within normal limits. Both lungs are clear. The visualized skeletal structures are unremarkable.  IMPRESSION: No active cardiopulmonary disease.   Electronically Signed   By: Burman Nieves M.D.   On: 03/23/2015 05:47   Ct Head Wo Contrast  03/23/2015   CLINICAL DATA:  49 year old female with frontal headache and nausea, off hypertension medication for 2 days. Initial encounter.  EXAM: CT HEAD WITHOUT CONTRAST  TECHNIQUE: Contiguous axial images were obtained from the base of the skull through the vertex without intravenous contrast.  COMPARISON:  None.  FINDINGS: Chronic left lamina papyracea fracture. Visualized paranasal sinuses and mastoids are clear. No acute osseous abnormality identified. Visualized orbit soft tissues are within normal limits. Visualized scalp soft tissues are within normal limits.  Normal cerebral volume. No  ventriculomegaly. No midline shift, mass effect, or evidence of intracranial mass lesion. Right inferior deep gray matter perivascular space suspected. Gray-white matter differentiation within normal limits. Minimal basal ganglia dystrophic calcifications. No evidence of cortically based acute infarction identified. No acute intracranial hemorrhage identified. No suspicious intracranial vascular hyperdensity.  IMPRESSION: Negative noncontrast CT appearance of the brain.   Electronically Signed   By: Odessa Fleming M.D.   On: 03/23/2015 07:24     EKG Interpretation   Date/Time:  Tuesday March 23 2015 04:12:24 EDT Ventricular Rate:  59 PR Interval:  159 QRS Duration: 91 QT Interval:  439 QTC Calculation: 435 R Axis:   78 Text Interpretation:  Sinus rhythm Confirmed by [facility]  MD, APRIL  ([id number]) on 03/23/2015 4:31:00 AM      MDM   Final diagnoses:  Headache, unspecified headache type  Nausea  Bilateral thoracic back pain    Labs:  D-dimer, i-STAT Chem-8, i-STAT troponin, CBC- no significant findings  Imaging: CT head without contrast negative  Consults: none  Therapeutics: Compazine, Benadryl, Toradol  Assessment: Headache, back pain, nausea   Plan: Patient presented with headache, back pain, nausea. Patient is not taking her blood pressure medication she has not been to the pharmacy to pick it up, she does have a prescription available;  BP found to be elevated this morning. She's had headache for 4 days, no red flags, 100% resolved using the ED, negative CT head. Back pain is tender to palpation, d-dimer negative, no signs of trauma, pain resolved with Toradol. Pain is episodic lasting approximately one second, with the negative d-dimer, point tender this is unlikely aneurysm or any other serious cause of concern. Nausea resolved with Compazine. Patient has a significant cardiac history, heart score 3, has no chest pain or associated symptoms. Negative troponin, normal EKG. Patient  was resting comfortably in exam bed with no acute concerns at the time of discharge. He is given strict return precautions, instructed follow-up with primary care provider in 3 days for reevaluation, and encourage return immediately if worsening signs or symptoms presented. Patient verbalized understanding and agreement to today's plan and had no further questions concerns. Ibuprofen for headache, zofran for nausea.      Eyvonne Mechanic,  PA-C 03/26/15 0112

## 2015-03-26 NOTE — ED Provider Notes (Signed)
Medical screening examination/treatment/procedure(s) were performed by non-physician practitioner and as supervising physician I was immediately available for consultation/collaboration.   EKG Interpretation   Date/Time:  Tuesday March 23 2015 04:12:24 EDT Ventricular Rate:  59 PR Interval:  159 QRS Duration: 91 QT Interval:  439 QTC Calculation: 435 R Axis:   78 Text Interpretation:  Sinus rhythm Confirmed by [facility]  MD, Selby Slovacek  ([id number]) on 03/23/2015 4:31:00 AM       Kysen Wetherington, MD 03/26/15 0388

## 2015-05-02 ENCOUNTER — Encounter (HOSPITAL_COMMUNITY): Payer: Self-pay | Admitting: Emergency Medicine

## 2015-05-02 ENCOUNTER — Emergency Department (HOSPITAL_COMMUNITY)
Admission: EM | Admit: 2015-05-02 | Discharge: 2015-05-02 | Disposition: A | Discharge status: Home / Self Care | Payer: Medicare Other | Attending: Emergency Medicine | Admitting: Emergency Medicine

## 2015-05-02 DIAGNOSIS — Z720 Tobacco use: Secondary | ICD-10-CM | POA: Diagnosis not present

## 2015-05-02 DIAGNOSIS — I2510 Atherosclerotic heart disease of native coronary artery without angina pectoris: Secondary | ICD-10-CM | POA: Insufficient documentation

## 2015-05-02 DIAGNOSIS — Z9851 Tubal ligation status: Secondary | ICD-10-CM | POA: Insufficient documentation

## 2015-05-02 DIAGNOSIS — Z880 Allergy status to penicillin: Secondary | ICD-10-CM | POA: Diagnosis not present

## 2015-05-02 DIAGNOSIS — E559 Vitamin D deficiency, unspecified: Secondary | ICD-10-CM | POA: Insufficient documentation

## 2015-05-02 DIAGNOSIS — E785 Hyperlipidemia, unspecified: Secondary | ICD-10-CM | POA: Diagnosis not present

## 2015-05-02 DIAGNOSIS — Z8669 Personal history of other diseases of the nervous system and sense organs: Secondary | ICD-10-CM | POA: Insufficient documentation

## 2015-05-02 DIAGNOSIS — Z79899 Other long term (current) drug therapy: Secondary | ICD-10-CM | POA: Diagnosis not present

## 2015-05-02 DIAGNOSIS — Z7982 Long term (current) use of aspirin: Secondary | ICD-10-CM | POA: Insufficient documentation

## 2015-05-02 DIAGNOSIS — N611 Abscess of the breast and nipple: Secondary | ICD-10-CM

## 2015-05-02 DIAGNOSIS — K219 Gastro-esophageal reflux disease without esophagitis: Secondary | ICD-10-CM | POA: Diagnosis not present

## 2015-05-02 DIAGNOSIS — Z9861 Coronary angioplasty status: Secondary | ICD-10-CM | POA: Diagnosis not present

## 2015-05-02 DIAGNOSIS — I10 Essential (primary) hypertension: Secondary | ICD-10-CM | POA: Insufficient documentation

## 2015-05-02 DIAGNOSIS — Z8659 Personal history of other mental and behavioral disorders: Secondary | ICD-10-CM | POA: Diagnosis not present

## 2015-05-02 DIAGNOSIS — N61 Inflammatory disorders of breast: Secondary | ICD-10-CM | POA: Insufficient documentation

## 2015-05-02 DIAGNOSIS — R21 Rash and other nonspecific skin eruption: Secondary | ICD-10-CM | POA: Diagnosis present

## 2015-05-02 MED ORDER — SULFAMETHOXAZOLE-TRIMETHOPRIM 800-160 MG PO TABS
1.0000 | ORAL_TABLET | Freq: Two times a day (BID) | ORAL | Status: AC
Start: 2015-05-02 — End: 2015-05-09

## 2015-05-02 MED ORDER — HYDROXYZINE HCL 10 MG PO TABS: 10.0000 mg | ORAL_TABLET | Freq: Four times a day (QID) | ORAL | Status: DC | PRN

## 2015-05-02 NOTE — ED Provider Notes (Signed)
CSN: [id number]     Arrival date & time 05/02/15  1638 History   First MD Initiated Contact with Patient 05/02/15 1653     Chief Complaint  Patient presents with  . Rash     (Consider location/radiation/quality/duration/timing/severity/associated sxs/prior Treatment) HPI [name] is a 49 y.o. female who comes in for evaluation of rash. Patient is on Wednesday she felt "something sting me on my right nipple". Since that time she has had increased redness and swelling around her right nipple with associated intense itching. She has tried Desyrel and hydrocortisone cream without relief of her symptoms. She denies any fevers, chills, shortness of breath, chest pain, difficulty swallowing, nausea or vomiting, abdominal pain, other skin rash. No other aggravating or modifying factors.  Past Medical History  Diagnosis Date  . Coronary artery disease 02/2008    a. s/p INF STEMI 5/09 tx with BMS to CFX;  b. Lexiscan Myoview 12/10 -  low risk, no ischemia.;   c. s/p NSTEMI 8/12: cath with LM 10%, LAD lum. irregs., oOM1 40%, mOM2 40%, mCFX stent occluded, RCA 50%, EF 45-50%; PCI - s/p DES to mCFX  d. Patent LCx stent, diffuse disease up to 50% in small nondominant RCA, 80% ostial stenosis in small OM3.   . Bipolar disorder      Followed by mental health, who prescribe her bipolar meds  . Cerebral palsy      with history of neuropathic pain  and spasticity,  requiring long-term pain medications  . Hypertension   . GERD (gastroesophageal reflux disease)   . Vitamin D deficiency 11/2009     patient treated with high-dose vitamin D weekly x3 months,  levels were rechecked and it was determined that the patient continued to require high dose repletion of vitamin D.  . History of pelvic mass     Found on CT abd/ pelvis (04/2008) - 13.3 cm cystic pelvis mass, possibly left ovarian origin. To be followed at [facility].   Marland Kitchen HLD (hyperlipidemia)    Past Surgical History  Procedure Laterality Date  . Total  abdominal hysterectomy  03/2003     secondary to symptomatic fibroids.  . Cesarean section      x2  . Tubal ligation    .  elective abortion      x2  . Other surgical history       heel surgery, bilateral  . Coronary angioplasty with stent placement    . Left heart catheterization with coronary angiogram N/A 10/06/2011    Procedure: LEFT HEART CATHETERIZATION WITH CORONARY ANGIOGRAM;  Surgeon: Laurey Morale, MD;  Location: [facility] CATH LAB;  Service: Cardiovascular;  Laterality: N/A;   Family History  Problem Relation Age of Onset  . Lung disease Mother 28    Died of respiratory failure  . Heart attack Father 76   History  Substance Use Topics  . Smoking status: Current Every Day Smoker -- 0.50 packs/day for 25 years    Types: Cigarettes  . Smokeless tobacco: Not on file     Comment: Sometimes less.  . Alcohol Use: 0.0 oz/week    0 Standard drinks or equivalent per week     Comment: Rarely.   OB History    No data available     Review of Systems A 10 point review of systems was completed and was negative except for pertinent positives and negatives as mentioned in the history of present illness     Allergies  Penicillins  Home Medications  Prior to Admission medications   Medication Sig Start Date End Date Taking? Authorizing Provider  aspirin 81 MG tablet Take 81 mg by mouth daily.     Yes Historical Provider, MD  cholecalciferol (VITAMIN D) 1000 UNITS tablet Take 1,000 Units by mouth daily.   Yes Historical Provider, MD  diazepam (VALIUM) 5 MG tablet Take 1 tablet (5 mg total) by mouth every 6 (six) hours as needed for anxiety. 12/08/14  Yes Ejiroghene Wendall Stade, MD  metoprolol succinate (TOPROL-XL) 25 MG 24 hr tablet Take 0.5 tablets (12.5 mg total) by mouth daily. 03/23/15  Yes Jeffrey Hedges, PA-C  nitroGLYCERIN (NITROSTAT) 0.4 MG SL tablet Place 0.4 mg under the tongue every 5 (five) minutes as needed for chest pain.   Yes Historical Provider, MD  ondansetron  (ZOFRAN) 4 MG tablet Take 1 tablet (4 mg total) by mouth every 6 (six) hours. 03/23/15  Yes Jeffrey Hedges, PA-C  pantoprazole (PROTONIX) 40 MG tablet Take 1 tablet (40 mg total) by mouth daily. Patient taking differently: Take 40 mg by mouth 2 (two) times daily.  12/08/14  Yes Ejiroghene E Emokpae, MD  simvastatin (ZOCOR) 40 MG tablet Take 1 tablet (40 mg total) by mouth every evening. 12/08/14  Yes Ejiroghene E Emokpae, MD  zolpidem (AMBIEN) 5 MG tablet Take 1 tablet (5 mg total) by mouth at bedtime as needed for sleep. 12/08/14  Yes Ejiroghene Wendall Stade, MD  hydrOXYzine (ATARAX/VISTARIL) 10 MG tablet Take 1 tablet (10 mg total) by mouth every 6 (six) hours as needed for itching. 05/02/15   Joycie Peek, PA-C  sulfamethoxazole-trimethoprim (BACTRIM DS,SEPTRA DS) 800-160 MG per tablet Take 1 tablet by mouth 2 (two) times daily. 05/02/15 05/09/15  Joycie Peek, PA-C   BP 177/94 mmHg  Pulse 60  Temp(Src) 98 F (36.7 C) (Oral)  Resp 17  SpO2 100%  LMP 12/22/2007 Physical Exam  Constitutional:  Awake, alert, nontoxic appearance.  HENT:  Head: Atraumatic.  Eyes: Right eye exhibits no discharge. Left eye exhibits no discharge.  Neck: Neck supple.  Pulmonary/Chest: Effort normal. She exhibits no tenderness.    Chaperone present for breast exam.  Abdominal: Soft. There is no tenderness. There is no rebound.  Musculoskeletal: She exhibits no tenderness.  Baseline ROM, no obvious new focal weakness.  Neurological:  Mental status and motor strength appears baseline for patient and situation.  Skin: No rash noted.  Psychiatric: She has a normal mood and affect.  Nursing note and vitals reviewed.     ED Course  Procedures (including critical care time) Labs Review Labs Reviewed - No data to display  Imaging Review No results found.   EKG Interpretation None     Meds given in ED:  Medications - No data to display  New Prescriptions   HYDROXYZINE (ATARAX/VISTARIL) 10 MG TABLET     Take 1 tablet (10 mg total) by mouth every 6 (six) hours as needed for itching.   SULFAMETHOXAZOLE-TRIMETHOPRIM (BACTRIM DS,SEPTRA DS) 800-160 MG PER TABLET    Take 1 tablet by mouth 2 (two) times daily.   Filed Vitals:   05/02/15 1735  BP: 177/94  Pulse: 60  Temp: 98 F (36.7 C)  TempSrc: Oral  Resp: 17  SpO2: 100%    MDM  Vitals stable - WNL -afebrile Pt resting comfortably in ED. PE--physical exam concerning for abscess over right nipple. No mastitis or nipple drainage. No lymphadenopathy Will initiate patient on empiric antibiotics and have her follow-up with breast surgeon for further evaluation of  abscess.  I discussed all relevant lab findings and imaging results with pt and they verbalized understanding. Discussed f/u with PCP within 48 hrs and return precautions, pt very amenable to plan. Prior to patient discharge, I discussed and reviewed this case with Dr. Micheline Maze   Final diagnoses:  Breast abscess of female        Joycie Peek, PA-C 05/03/15 1017  Toy Cookey, MD 05/04/15 [id number]

## 2015-05-02 NOTE — Discharge Instructions (Signed)
You were seen and evaluated in the ED today for your breast discomfort. It is reviewed follow-up with [facility] in the breast surgeon for further evaluation and management of the possible infection at the end of your nipple. Please take her antibiotics as prescribed. Take the Vistaril for the itching. Return to ED for worsening symptoms.  Abscess An abscess is an infected area that contains a collection of pus and debris.It can occur in almost any part of the body. An abscess is also known as a furuncle or boil. CAUSES  An abscess occurs when tissue gets infected. This can occur from blockage of oil or sweat glands, infection of hair follicles, or a minor injury to the skin. As the body tries to fight the infection, pus collects in the area and creates pressure under the skin. This pressure causes pain. People with weakened immune systems have difficulty fighting infections and get certain abscesses more often.  SYMPTOMS Usually an abscess develops on the skin and becomes a painful mass that is red, warm, and tender. If the abscess forms under the skin, you may feel a moveable soft area under the skin. Some abscesses break open (rupture) on their own, but most will continue to get worse without care. The infection can spread deeper into the body and eventually into the bloodstream, causing you to feel ill.  DIAGNOSIS  Your caregiver will take your medical history and perform a physical exam. A sample of fluid may also be taken from the abscess to determine what is causing your infection. TREATMENT  Your caregiver may prescribe antibiotic medicines to fight the infection. However, taking antibiotics alone usually does not cure an abscess. Your caregiver may need to make a small cut (incision) in the abscess to drain the pus. In some cases, gauze is packed into the abscess to reduce pain and to continue draining the area. HOME CARE INSTRUCTIONS   Only take over-the-counter or prescription  medicines for pain, discomfort, or fever as directed by your caregiver.  If you were prescribed antibiotics, take them as directed. Finish them even if you start to feel better.  If gauze is used, follow your caregiver's directions for changing the gauze.  To avoid spreading the infection:  Keep your draining abscess covered with a bandage.  Wash your hands well.  Do not share personal care items, towels, or whirlpools with others.  Avoid skin contact with others.  Keep your skin and clothes clean around the abscess.  Keep all follow-up appointments as directed by your caregiver. SEEK MEDICAL CARE IF:   You have increased pain, swelling, redness, fluid drainage, or bleeding.  You have muscle aches, chills, or a general ill feeling.  You have a fever. MAKE SURE YOU:   Understand these instructions.  Will watch your condition.  Will get help right away if you are not doing well or get worse. Document Released: 07/05/2005 Document Revised: 03/26/2012 Document Reviewed: 12/08/2011 [facility] Patient Information 2015 [place], [place]. This information is not intended to replace advice given to you by your health care provider. Make sure you discuss any questions you have with your health care provider.

## 2015-05-02 NOTE — ED Notes (Signed)
Pt states that she began an itchy rash with some swelling on her Right nipple area.  Mbr states that a round swollen area below nipple is not new and has been there for a long time.  She has not begun new creams or lotions in the recent past.

## 2015-05-06 ENCOUNTER — Telehealth: Payer: Self-pay | Admitting: Internal Medicine

## 2015-05-06 NOTE — Telephone Encounter (Signed)
Call to patient to confirm appointment for 05/07/15 at 10:15 lmtcb

## 2015-05-07 ENCOUNTER — Ambulatory Visit (INDEPENDENT_AMBULATORY_CARE_PROVIDER_SITE_OTHER): Payer: Medicare Other | Admitting: Internal Medicine

## 2015-05-07 ENCOUNTER — Encounter: Payer: Self-pay | Admitting: Internal Medicine

## 2015-05-07 VITALS — BP 134/74 | HR 52 | Temp 98.0°F

## 2015-05-07 DIAGNOSIS — N61 Inflammatory disorders of breast: Secondary | ICD-10-CM | POA: Diagnosis not present

## 2015-05-07 DIAGNOSIS — N611 Abscess of the breast and nipple: Secondary | ICD-10-CM | POA: Insufficient documentation

## 2015-05-07 DIAGNOSIS — L0291 Cutaneous abscess, unspecified: Secondary | ICD-10-CM

## 2015-05-07 HISTORY — DX: Abscess of the breast and nipple: N61.1

## 2015-05-07 NOTE — Patient Instructions (Signed)
1.Please apply warm compresses to your breast several times per day for about 10 minutes at a time to promote drainage.  Keep the area covered with clean bandage changed at least once per day.  Finish the Bactrim.  Call if the redness and swelling comes back or if you develop fever, nausea or vomiting.   2. Please take all medications as prescribed.    3. If you have worsening of your symptoms or new symptoms arise, please call the clinic ([phone]), or go to the ER immediately if symptoms are severe.  Come back to see me on August 10th to check the wound.  I would like for you to get a mammogram after this has healed.

## 2015-05-07 NOTE — Progress Notes (Signed)
Subjective:    Patient ID: [name], female    DOB: Oct 29, 1965, 49 y.o.   MRN: [id number]  HPI Comments: [name] is a 49 year old woman with PMH as below here for ED follow-up after evaluation for abscess.  Please see problem based charting for assessment and plan.      Past Medical History  Diagnosis Date  . Coronary artery disease 02/2008    a. s/p INF STEMI 5/09 tx with BMS to CFX;  b. Lexiscan Myoview 12/10 -  low risk, no ischemia.;   c. s/p NSTEMI 8/12: cath with LM 10%, LAD lum. irregs., oOM1 40%, mOM2 40%, mCFX stent occluded, RCA 50%, EF 45-50%; PCI - s/p DES to mCFX  d. Patent LCx stent, diffuse disease up to 50% in small nondominant RCA, 80% ostial stenosis in small OM3.   . Bipolar disorder      Followed by mental health, who prescribe her bipolar meds  . Cerebral palsy      with history of neuropathic pain  and spasticity,  requiring long-term pain medications  . Hypertension   . GERD (gastroesophageal reflux disease)   . Vitamin D deficiency 11/2009     patient treated with high-dose vitamin D weekly x3 months,  levels were rechecked and it was determined that the patient continued to require high dose repletion of vitamin D.  . History of pelvic mass     Found on CT abd/ pelvis (04/2008) - 13.3 cm cystic pelvis mass, possibly left ovarian origin. To be followed at [facility].   Marland Kitchen HLD (hyperlipidemia)    Current Outpatient Prescriptions on File Prior to Visit  Medication Sig Dispense Refill  . aspirin 81 MG tablet Take 81 mg by mouth daily.      . cholecalciferol (VITAMIN D) 1000 UNITS tablet Take 1,000 Units by mouth daily.    . diazepam (VALIUM) 5 MG tablet Take 1 tablet (5 mg total) by mouth every 6 (six) hours as needed for anxiety. 30 tablet 2  . hydrOXYzine (ATARAX/VISTARIL) 10 MG tablet Take 1 tablet (10 mg total) by mouth every 6 (six) hours as needed for itching. 30 tablet 0  . metoprolol succinate (TOPROL-XL) 25 MG 24 hr tablet Take 0.5 tablets (12.5 mg  total) by mouth daily. 30 tablet 6  . nitroGLYCERIN (NITROSTAT) 0.4 MG SL tablet Place 0.4 mg under the tongue every 5 (five) minutes as needed for chest pain.    Marland Kitchen ondansetron (ZOFRAN) 4 MG tablet Take 1 tablet (4 mg total) by mouth every 6 (six) hours. 12 tablet 0  . pantoprazole (PROTONIX) 40 MG tablet Take 1 tablet (40 mg total) by mouth daily. (Patient taking differently: Take 40 mg by mouth 2 (two) times daily. ) 30 tablet 3  . simvastatin (ZOCOR) 40 MG tablet Take 1 tablet (40 mg total) by mouth every evening. 30 tablet 6  . sulfamethoxazole-trimethoprim (BACTRIM DS,SEPTRA DS) 800-160 MG per tablet Take 1 tablet by mouth 2 (two) times daily. 14 tablet 0  . zolpidem (AMBIEN) 5 MG tablet Take 1 tablet (5 mg total) by mouth at bedtime as needed for sleep. 30 tablet 1   No current facility-administered medications on file prior to visit.    Review of Systems  Constitutional: Positive for appetite change and fatigue. Negative for fever and chills.  Respiratory: Negative for cough and shortness of breath.   Cardiovascular: Negative for chest pain.  Gastrointestinal: Negative for nausea, vomiting, abdominal pain and diarrhea.  Musculoskeletal: Negative  for myalgias.  Neurological: Negative for headaches.       Occasional headaches, no new or severe headaches.       Filed Vitals:   05/07/15 1047  BP: 134/74  Pulse: 52  Temp: 98 F (36.7 C)  TempSrc: Oral  SpO2: 100%     Objective:   Physical Exam  Constitutional: She is oriented to person, place, and time. She appears well-developed. No distress.  HENT:  Head: Normocephalic and atraumatic.  Mouth/Throat: Oropharynx is clear and moist. No oropharyngeal exudate.  Eyes: EOM are normal. Pupils are equal, round, and reactive to light.  Neck: Neck supple.  Cardiovascular: Normal rate, regular rhythm and normal heart sounds.  Exam reveals no gallop and no friction rub.   No murmur heard. Pulmonary/Chest: Effort normal and breath  sounds normal. No respiratory distress. She has no wheezes. She has no rales.  Abdominal: Soft. Bowel sounds are normal. She exhibits no distension. There is no tenderness. There is no rebound and no guarding.  Musculoskeletal: Normal range of motion. She exhibits no edema or tenderness.  Neurological: She is alert and oriented to person, place, and time. No cranial nerve deficit.  Skin: Skin is warm and dry. She is not diaphoretic. There is erythema.  4 x 4.5cm abscess above right nipple.  No induration. Fluctuant and drains easily.  Mildly TTP.  Psychiatric: She has a normal mood and affect. Her behavior is normal. Judgment and thought content normal.  Vitals reviewed.         Assessment & Plan:  Please see problem based assessment and plan.

## 2015-05-07 NOTE — Assessment & Plan Note (Addendum)
Abscess on right breast above the nipple x 1.5 weeks.  She initially thought she was getting eczema on her breast (common place for her eczema and she was scratching the area a lot) but then it became red and swollen.  She was concerned so she went to the ED five days ago and was prescribed Bactrim.  It began to drain pus this morning so she made an appt in [facility].  She says it looked worse and has improved and gotten smaller and less red since she started the abx.  She denies fever, N/V, diarrhea.  She has had less appetite since this happened.  The abscess in 4.5x4cm above the nipple of the right breast.  It is easily drains pus when pressed.  It is TTP but she says this has improved as well.  There is no drainage from the nipple or nipple involvement.  This is likely due to secondary infection from scratching at eczematous skin.  It seems to be improving.  Other concern would be inflammatory breast cancer.  Last mammo was 2014 and revealed a cyst in the left breast. - finish Bactrim - start warm compresses to abscess for 10 min at a time prn - wound was re-dressed and she was provided with clean gauze to cover it with at home - she will return to see me in 2 weeks for follow-up - will refer for mammogram at that visit

## 2015-05-08 ENCOUNTER — Encounter (HOSPITAL_COMMUNITY): Payer: Self-pay | Admitting: Emergency Medicine

## 2015-05-08 ENCOUNTER — Emergency Department (HOSPITAL_COMMUNITY)
Admission: EM | Admit: 2015-05-08 | Discharge: 2015-05-08 | Disposition: A | Discharge status: Home / Self Care | Payer: Medicare Other | Attending: Emergency Medicine | Admitting: Emergency Medicine

## 2015-05-08 DIAGNOSIS — E785 Hyperlipidemia, unspecified: Secondary | ICD-10-CM | POA: Insufficient documentation

## 2015-05-08 DIAGNOSIS — I10 Essential (primary) hypertension: Secondary | ICD-10-CM | POA: Diagnosis not present

## 2015-05-08 DIAGNOSIS — K219 Gastro-esophageal reflux disease without esophagitis: Secondary | ICD-10-CM | POA: Insufficient documentation

## 2015-05-08 DIAGNOSIS — Z8669 Personal history of other diseases of the nervous system and sense organs: Secondary | ICD-10-CM | POA: Insufficient documentation

## 2015-05-08 DIAGNOSIS — Z880 Allergy status to penicillin: Secondary | ICD-10-CM | POA: Diagnosis not present

## 2015-05-08 DIAGNOSIS — E559 Vitamin D deficiency, unspecified: Secondary | ICD-10-CM | POA: Insufficient documentation

## 2015-05-08 DIAGNOSIS — Z79899 Other long term (current) drug therapy: Secondary | ICD-10-CM | POA: Diagnosis not present

## 2015-05-08 DIAGNOSIS — Z720 Tobacco use: Secondary | ICD-10-CM | POA: Insufficient documentation

## 2015-05-08 DIAGNOSIS — Z7982 Long term (current) use of aspirin: Secondary | ICD-10-CM | POA: Insufficient documentation

## 2015-05-08 DIAGNOSIS — Z9861 Coronary angioplasty status: Secondary | ICD-10-CM | POA: Diagnosis not present

## 2015-05-08 DIAGNOSIS — I2510 Atherosclerotic heart disease of native coronary artery without angina pectoris: Secondary | ICD-10-CM | POA: Diagnosis not present

## 2015-05-08 DIAGNOSIS — N61 Inflammatory disorders of breast: Secondary | ICD-10-CM | POA: Insufficient documentation

## 2015-05-08 DIAGNOSIS — Z8659 Personal history of other mental and behavioral disorders: Secondary | ICD-10-CM | POA: Diagnosis not present

## 2015-05-08 DIAGNOSIS — N611 Abscess of the breast and nipple: Secondary | ICD-10-CM

## 2015-05-08 MED ORDER — SULFAMETHOXAZOLE-TRIMETHOPRIM 800-160 MG PO TABS
1.0000 | ORAL_TABLET | Freq: Two times a day (BID) | ORAL | Status: AC
Start: 2015-05-08 — End: 2015-05-15

## 2015-05-08 NOTE — ED Notes (Signed)
Pt states she had abscess drained on breast and now its a whole in it.

## 2015-05-08 NOTE — Discharge Instructions (Signed)
Warm compresses or soaks to the right breast area. Keep it covered with gauze. The "hole" in drainage is normal, and it should heal once the infection is gone. Please follow-up with your doctor if it is not improving. Return if worsening.   Abscess An abscess is an infected area that contains a collection of pus and debris.It can occur in almost any part of the body. An abscess is also known as a furuncle or boil. CAUSES  An abscess occurs when tissue gets infected. This can occur from blockage of oil or sweat glands, infection of hair follicles, or a minor injury to the skin. As the body tries to fight the infection, pus collects in the area and creates pressure under the skin. This pressure causes pain. People with weakened immune systems have difficulty fighting infections and get certain abscesses more often.  SYMPTOMS Usually an abscess develops on the skin and becomes a painful mass that is red, warm, and tender. If the abscess forms under the skin, you may feel a moveable soft area under the skin. Some abscesses break open (rupture) on their own, but most will continue to get worse without care. The infection can spread deeper into the body and eventually into the bloodstream, causing you to feel ill.  DIAGNOSIS  Your caregiver will take your medical history and perform a physical exam. A sample of fluid may also be taken from the abscess to determine what is causing your infection. TREATMENT  Your caregiver may prescribe antibiotic medicines to fight the infection. However, taking antibiotics alone usually does not cure an abscess. Your caregiver may need to make a small cut (incision) in the abscess to drain the pus. In some cases, gauze is packed into the abscess to reduce pain and to continue draining the area. HOME CARE INSTRUCTIONS   Only take over-the-counter or prescription medicines for pain, discomfort, or fever as directed by your caregiver.  If you were prescribed antibiotics,  take them as directed. Finish them even if you start to feel better.  If gauze is used, follow your caregiver's directions for changing the gauze.  To avoid spreading the infection:  Keep your draining abscess covered with a bandage.  Wash your hands well.  Do not share personal care items, towels, or whirlpools with others.  Avoid skin contact with others.  Keep your skin and clothes clean around the abscess.  Keep all follow-up appointments as directed by your caregiver. SEEK MEDICAL CARE IF:   You have increased pain, swelling, redness, fluid drainage, or bleeding.  You have muscle aches, chills, or a general ill feeling.  You have a fever. MAKE SURE YOU:   Understand these instructions.  Will watch your condition.  Will get help right away if you are not doing well or get worse. Document Released: 07/05/2005 Document Revised: 03/26/2012 Document Reviewed: 12/08/2011 [facility] Patient Information 2015 [place], [place]. This information is not intended to replace advice given to you by your health care provider. Make sure you discuss any questions you have with your health care provider.

## 2015-05-08 NOTE — ED Provider Notes (Signed)
CSN: [id number]     Arrival date & time 05/08/15  1642 History  This chart was scribed for non-physician practitioner Jaynie Crumble, PA-C, working with Gwyneth Sprout, MD, by Tanda Rockers, ED Scribe. This patient was seen in room WTR5/WTR5 and the patient's care was started at 4:48 PM.  Chief Complaint  Patient presents with  . Abscess   The history is provided by the patient. No language interpreter was used.     HPI Comments: [name] is a 49 y.o. female who presents to the Emergency Department complaining of abscess to right breast that occurred 1.5 weeks ago. Pt was seen in ED on 05/02/2015 (1 week ago) for the abscess. She did not have an I&D done at that time but was prescribed Bactrim. Pt is in the ED today because she states there is a hole in the abscess, which is causing her concern. Pt notes drainage to the area as well. She was seen by PCP 1 day ago and was told to just keep gauze on the area. She denies any increasing redness or warmth and states it has been healing well. Denies fever, chills, or any other associated symptoms.     Past Medical History  Diagnosis Date  . Coronary artery disease 02/2008    a. s/p INF STEMI 5/09 tx with BMS to CFX;  b. Lexiscan Myoview 12/10 -  low risk, no ischemia.;   c. s/p NSTEMI 8/12: cath with LM 10%, LAD lum. irregs., oOM1 40%, mOM2 40%, mCFX stent occluded, RCA 50%, EF 45-50%; PCI - s/p DES to mCFX  d. Patent LCx stent, diffuse disease up to 50% in small nondominant RCA, 80% ostial stenosis in small OM3.   . Bipolar disorder      Followed by mental health, who prescribe her bipolar meds  . Cerebral palsy      with history of neuropathic pain  and spasticity,  requiring long-term pain medications  . Hypertension   . GERD (gastroesophageal reflux disease)   . Vitamin D deficiency 11/2009     patient treated with high-dose vitamin D weekly x3 months,  levels were rechecked and it was determined that the patient continued to  require high dose repletion of vitamin D.  . History of pelvic mass     Found on CT abd/ pelvis (04/2008) - 13.3 cm cystic pelvis mass, possibly left ovarian origin. To be followed at [facility].   Marland Kitchen HLD (hyperlipidemia)    Past Surgical History  Procedure Laterality Date  . Total abdominal hysterectomy  03/2003     secondary to symptomatic fibroids.  . Cesarean section      x2  . Tubal ligation    .  elective abortion      x2  . Other surgical history       heel surgery, bilateral  . Coronary angioplasty with stent placement    . Left heart catheterization with coronary angiogram N/A 10/06/2011    Procedure: LEFT HEART CATHETERIZATION WITH CORONARY ANGIOGRAM;  Surgeon: Laurey Morale, MD;  Location: [facility] CATH LAB;  Service: Cardiovascular;  Laterality: N/A;   Family History  Problem Relation Age of Onset  . Lung disease Mother 32    Died of respiratory failure  . Heart attack Father 68   History  Substance Use Topics  . Smoking status: Current Every Day Smoker -- 0.50 packs/day for 25 years    Types: Cigarettes  . Smokeless tobacco: Not on file     Comment: Sometimes  less.  . Alcohol Use: 0.0 oz/week    0 Standard drinks or equivalent per week     Comment: Rarely.   OB History    No data available     Review of Systems  Constitutional: Negative for fever and chills.  Skin: Positive for wound. Negative for color change.   Allergies  Penicillins  Home Medications   Prior to Admission medications   Medication Sig Start Date End Date Taking? Authorizing Provider  aspirin 81 MG tablet Take 81 mg by mouth daily.      Historical Provider, MD  cholecalciferol (VITAMIN D) 1000 UNITS tablet Take 1,000 Units by mouth daily.    Historical Provider, MD  diazepam (VALIUM) 5 MG tablet Take 1 tablet (5 mg total) by mouth every 6 (six) hours as needed for anxiety. 12/08/14   Ejiroghene Wendall Stade, MD  hydrOXYzine (ATARAX/VISTARIL) 10 MG tablet Take 1 tablet (10 mg total) by mouth every 6  (six) hours as needed for itching. Patient not taking: Reported on 05/07/2015 05/02/15   Joycie Peek, PA-C  metoprolol succinate (TOPROL-XL) 25 MG 24 hr tablet Take 0.5 tablets (12.5 mg total) by mouth daily. 03/23/15   Eyvonne Mechanic, PA-C  nitroGLYCERIN (NITROSTAT) 0.4 MG SL tablet Place 0.4 mg under the tongue every 5 (five) minutes as needed for chest pain.    Historical Provider, MD  ondansetron (ZOFRAN) 4 MG tablet Take 1 tablet (4 mg total) by mouth every 6 (six) hours. Patient not taking: Reported on 05/07/2015 03/23/15   Eyvonne Mechanic, PA-C  pantoprazole (PROTONIX) 40 MG tablet Take 1 tablet (40 mg total) by mouth daily. Patient taking differently: Take 40 mg by mouth 2 (two) times daily.  12/08/14   Ejiroghene Wendall Stade, MD  simvastatin (ZOCOR) 40 MG tablet Take 1 tablet (40 mg total) by mouth every evening. 12/08/14   Ejiroghene Wendall Stade, MD  sulfamethoxazole-trimethoprim (BACTRIM DS,SEPTRA DS) 800-160 MG per tablet Take 1 tablet by mouth 2 (two) times daily. 05/02/15 05/09/15  Joycie Peek, PA-C  zolpidem (AMBIEN) 5 MG tablet Take 1 tablet (5 mg total) by mouth at bedtime as needed for sleep. Patient not taking: Reported on 05/07/2015 12/08/14   Onnie Boer, MD   LMP 12/22/2007   Physical Exam  Constitutional: She is oriented to person, place, and time. She appears well-developed and well-nourished. No distress.  HENT:  Head: Normocephalic and atraumatic.  Eyes: Conjunctivae and EOM are normal.  Neck: Neck supple. No tracheal deviation present.  Cardiovascular: Normal rate.   Pulmonary/Chest: Effort normal. No respiratory distress.  Musculoskeletal: Normal range of motion.  Neurological: She is alert and oriented to person, place, and time.  Skin: Skin is warm and dry.  Area of erythema and induration to the right breast, just above the nipple,showing 4 cm in diameter. There is a central opening with sanguinous drainage. Tender to palpation.  Psychiatric: She has a normal  mood and affect. Her behavior is normal.  Nursing note and vitals reviewed.   ED Course  Procedures (including critical care time)  DIAGNOSTIC STUDIES:  COORDINATION OF CARE: 4:51 PM-Discussed treatment plan which includes keeping wound clean, applying gauze, washing with soap and water, with pt at bedside and pt agreed to plan.   Labs Review Labs Reviewed - No data to display  Imaging Review No results found.   EKG Interpretation None      MDM   Final diagnoses:  Abscess of right breast   Patient is currently being treated for  an abscess of the right breast, she states it was not incised or drained prior to today. She states that she was placed on antibiotics and actually saw her doctor yesterday who rechecked her and told to continue antibiotics and warm compresses. Patient states today it opened up and she developed a hole in the middle of this area. It is draining. She states this is what brought her here, she was concerned. Patient's also brought a scab that fell off which caused her to have a central opening. I explained to her that this is normal, and infection needs to drain out. She'll needs to continue antibiotics, warm compresses, soaks. Also explained to her that she does need to follow-up with her doctor in case this lesion is not healing and have it biopsied. Patient voiced understanding Pr she has an appointment with her doctor. Return precautions discussed.  Filed Vitals:   05/08/15 1711  BP: 124/66  Pulse: 64  Temp: 98.4 F (36.9 C)  TempSrc: Oral  Resp: 16  SpO2: 97%   I personally performed the services described in this documentation, which was scribed in my presence. The recorded information has been reviewed and is accurate.   Jaynie Crumble, PA-C 05/08/15 1853  Gwyneth Sprout, MD 05/08/15 [phone]

## 2015-05-13 NOTE — Progress Notes (Signed)
Internal Medicine Clinic Attending  I saw and evaluated the patient.  I personally confirmed the key portions of the history and exam documented by Dr. Wilson and I reviewed pertinent patient test results.  The assessment, diagnosis, and plan were formulated together and I agree with the documentation in the resident's note. 

## 2015-05-20 ENCOUNTER — Telehealth: Payer: Self-pay | Admitting: Internal Medicine

## 2015-05-20 NOTE — Telephone Encounter (Signed)
Call to patient to confirm appointment for 05/21/15 at 10:30 lmtcb ° °

## 2015-05-21 ENCOUNTER — Ambulatory Visit (INDEPENDENT_AMBULATORY_CARE_PROVIDER_SITE_OTHER): Payer: Medicare Other | Admitting: Internal Medicine

## 2015-05-21 ENCOUNTER — Other Ambulatory Visit: Payer: Self-pay | Admitting: Internal Medicine

## 2015-05-21 VITALS — BP 145/78 | HR 65 | Temp 97.9°F | Wt 151.6 lb

## 2015-05-21 DIAGNOSIS — L0291 Cutaneous abscess, unspecified: Secondary | ICD-10-CM

## 2015-05-21 DIAGNOSIS — N611 Abscess of the breast and nipple: Secondary | ICD-10-CM

## 2015-05-21 DIAGNOSIS — N61 Inflammatory disorders of breast: Secondary | ICD-10-CM | POA: Diagnosis not present

## 2015-05-21 MED ORDER — DIPHENHYDRAMINE-ZINC ACETATE 2-0.1 % EX CREA: 1.0000 "application " | TOPICAL_CREAM | Freq: Three times a day (TID) | CUTANEOUS | Status: DC | PRN

## 2015-05-21 NOTE — Patient Instructions (Addendum)
[name] it was nice meeting you today. Please go for your breast mamm[facility] and [facility] on 05/24/15 at 2 pm. Meanwhile apply Benadryl Cream to the nipple as needed for itching.

## 2015-05-22 NOTE — Assessment & Plan Note (Signed)
Abscess on R breast areola for >2.5 weeks. Patient has already finished 2 courses of Bactrim and is using hot compresses. The area is not draining at present and is non-tender. However, patient complaining of pruritis - there is an erythematous rash that extends to the skin above the R areola. She has tried hydroxyzine but it did not help. Area underneath the areola is hard on palpation, most likely due to scar tissue formation. Patient does report having eczema of the R breast 2-3 times per year. Her symptoms could be due to a R breast abscess and superimposed eczema. However, Paget's disease has to be ruled out because patient does report having unintentional weight loss.  -We spoke to the [facility] and they have scheduled the patient for a diagnostic Mammo and breast ultrasound on Monday (8/15/1/6). We also recommended a full thickness skin wedge or punch biopsy because Paget's disease sometimes does not show up on imaging. As per breast center, they will do the mammo and ultrasound first and will do skin biopsy if needed. Skin biopsy can be done either at the [facility] or can be done by [facility].  -Extra-strength Benadryl cream to be applied to the area of rash for itching -F/u visit in 1 week (8/19)

## 2015-05-22 NOTE — Progress Notes (Signed)
Patient ID: [name], female   DOB: 1966/04/30, 49 y.o.   MRN: [id number]   Subjective:   Patient ID: [name] female   DOB: 1966/02/12 49 y.o.   MRN: [id number]  HPI: [name] is a 49 y.o. F with a PMHx cerebral palsy, HTN, HLD, CAD, and bipolar disorder presenting to the clinic for a follow up. Patient went to the ED on 7/24 and was diagnosed with a R breast abscess and prescribed Bactrim. She was at the clinic on 7/29 and recommended to continue Bactrim and start using hot compresses. On 7/30 the patient returned to the ED because the abscess drained on its own and was told to continue antibiotics and warm compresses. Patient states she has finished two courses of Bactrim, 7 days and 5 days. At present, she states there is no pain or drainage at the site, however, the site is hard, skin is rough, and the area is very itchy. Patient was prescribed Hydroxyzine previously for itching but states it is not helping. States she is applying coconut oil to the area which eases the itching a little. As per patient, she has a hx of Eczema which affects the R breast 2-3 times a year. Patient also states she has been loosing weight unintentionally. Denies having any fevers. States she feels tired chronically due to her cerebral palsy. As per pt, she had a complete hysterectomy in 2009 and has been having hot flashes since then. She does not follow up with an OB/GYN physician.     Past Medical History  Diagnosis Date  . Coronary artery disease 02/2008    a. s/p INF STEMI 5/09 tx with BMS to CFX;  b. Lexiscan Myoview 12/10 -  low risk, no ischemia.;   c. s/p NSTEMI 8/12: cath with LM 10%, LAD lum. irregs., oOM1 40%, mOM2 40%, mCFX stent occluded, RCA 50%, EF 45-50%; PCI - s/p DES to mCFX  d. Patent LCx stent, diffuse disease up to 50% in small nondominant RCA, 80% ostial stenosis in small OM3.   . Bipolar disorder      Followed by mental health, who prescribe her bipolar meds  .  Cerebral palsy      with history of neuropathic pain  and spasticity,  requiring long-term pain medications  . Hypertension   . GERD (gastroesophageal reflux disease)   . Vitamin D deficiency 11/2009     patient treated with high-dose vitamin D weekly x3 months,  levels were rechecked and it was determined that the patient continued to require high dose repletion of vitamin D.  . History of pelvic mass     Found on CT abd/ pelvis (04/2008) - 13.3 cm cystic pelvis mass, possibly left ovarian origin. To be followed at [facility].   Marland Kitchen HLD (hyperlipidemia)    Current Outpatient Prescriptions  Medication Sig Dispense Refill  . aspirin 81 MG tablet Take 81 mg by mouth daily.      . cholecalciferol (VITAMIN D) 1000 UNITS tablet Take 1,000 Units by mouth daily.    . diazepam (VALIUM) 5 MG tablet Take 1 tablet (5 mg total) by mouth every 6 (six) hours as needed for anxiety. 30 tablet 2  . metoprolol succinate (TOPROL-XL) 25 MG 24 hr tablet Take 0.5 tablets (12.5 mg total) by mouth daily. 30 tablet 6  . nitroGLYCERIN (NITROSTAT) 0.4 MG SL tablet Place 0.4 mg under the tongue every 5 (five) minutes as needed for chest pain.    . pantoprazole (PROTONIX)  40 MG tablet Take 1 tablet (40 mg total) by mouth daily. (Patient taking differently: Take 40 mg by mouth 2 (two) times daily. ) 30 tablet 3  . simvastatin (ZOCOR) 40 MG tablet Take 1 tablet (40 mg total) by mouth every evening. 30 tablet 6  . diphenhydrAMINE-zinc acetate (BENADRYL EXTRA STRENGTH) cream Apply 1 application topically 3 (three) times daily as needed for itching. 28.4 g 0   No current facility-administered medications for this visit.   Family History  Problem Relation Age of Onset  . Lung disease Mother 22    Died of respiratory failure  . Heart attack Father 28   Social History   Social History  . Marital Status: Divorced    Spouse Name: N/A  . Number of Children: N/A  . Years of Education: N/A   Social History Main Topics  . Smoking  status: Current Every Day Smoker -- 0.50 packs/day for 25 years    Types: Cigarettes  . Smokeless tobacco: Not on file     Comment: Sometimes less.  . Alcohol Use: 0.0 oz/week    0 Standard drinks or equivalent per week     Comment: Rarely.  . Drug Use: No  . Sexual Activity: Not Currently   Other Topics Concern  . Not on file   Social History Narrative   Review of Systems: Review of Systems  Constitutional: Positive for weight loss and malaise/fatigue. Negative for fever.  HENT: Negative for ear pain.   Eyes: Negative for blurred vision and pain.  Respiratory: Negative for cough, shortness of breath and wheezing.   Cardiovascular: Negative for chest pain, palpitations and leg swelling.  Gastrointestinal: Negative for nausea, vomiting, abdominal pain, diarrhea and constipation.  Genitourinary: Negative.   Skin: Positive for itching and rash.  Neurological: Negative for headaches.     Objective:  Physical Exam: Filed Vitals:   05/21/15 1029  BP: 145/78  Pulse: 65  Temp: 97.9 F (36.6 C)  TempSrc: Oral  Weight: 151 lb 9.6 oz (68.765 kg)  SpO2: 98%   Physical Exam  Constitutional: She is oriented to person, place, and time. No distress.  Patient in a wheelchair   HENT:  Head: Normocephalic and atraumatic.  Eyes: EOM are normal. Pupils are equal, round, and reactive to light.  Neck: Neck supple. No tracheal deviation present.  Cardiovascular: Normal rate, regular rhythm and intact distal pulses.   Pulmonary/Chest: Effort normal and breath sounds normal. No respiratory distress. She has no wheezes. She has no rales.  Abdominal: Soft. Bowel sounds are normal. She exhibits no distension. There is no tenderness.  Musculoskeletal: She exhibits no edema.  Neurological: She is alert and oriented to person, place, and time.  Skin: Skin is warm and dry. Rash noted. There is erythema.  R breast:  No active drainage from the site of abscess on the areola (above the nipple).    Area underneath the site of abscess is hard but non-tender on palpation.  No drainage from the nipple or the site of abscess upon palpation.  Presence of a rash that extends to the skin above the areola.   Bilateral breast exam:  No axillary lymphadenopathy appreciated. No masses felt on breast exam.     Assessment & Plan:

## 2015-05-24 ENCOUNTER — Other Ambulatory Visit: Payer: Self-pay | Admitting: Internal Medicine

## 2015-05-24 ENCOUNTER — Ambulatory Visit
Admission: RE | Admit: 2015-05-24 | Discharge: 2015-05-24 | Disposition: A | Discharge status: Home / Self Care | Payer: Medicare Other | Source: Ambulatory Visit | Attending: Internal Medicine | Admitting: Internal Medicine

## 2015-05-24 DIAGNOSIS — N611 Abscess of the breast and nipple: Secondary | ICD-10-CM

## 2015-05-24 DIAGNOSIS — N6489 Other specified disorders of breast: Secondary | ICD-10-CM | POA: Diagnosis not present

## 2015-05-24 NOTE — Progress Notes (Signed)
Internal Medicine Clinic Attending  I saw and evaluated the patient.  I personally confirmed the key portions of the history and exam documented by Dr. Loney Loh and I reviewed pertinent patient test results.  The assessment, diagnosis, and plan were formulated together and I agree with the documentation in the resident's note.  Options for further work up discussed extensively with the patient.  Most concerning is that this skin change is related to a malignancy; therefore mammogram and possible biopsy scheduled urgently.  Could also be eczema with overlying bacterial infection which is taking a long while to resolve.  Further work up after mammogram and biopsy.

## 2015-05-28 ENCOUNTER — Ambulatory Visit: Payer: Self-pay | Admitting: Internal Medicine

## 2015-05-28 ENCOUNTER — Telehealth: Payer: Self-pay | Admitting: Internal Medicine

## 2015-05-28 NOTE — Telephone Encounter (Signed)
Patient was supposed to be here today for a follow up to go over the mammogram and and breast US results. She cancelled her appointment this morning. I called there at 10:35 am and discussed the results over the phone. I told the patient that the next best step in her management is to get a skin biopsy to rule out possible breast cancer (Paget's disease) and that I had already put in a referral to General Surgery on 05/24/15. Patient states she does not want a biopsy. I again explained to her that I was concerned about the possibility of breast cancer and not getting biopsy done on time poses the risk of delaying diagnosis and treatment. She expressed her understanding and again refused biopsy. I told the patient she can think about it and call me at the clinic or make an appointment if she changes her mind.

## 2015-06-10 ENCOUNTER — Ambulatory Visit (INDEPENDENT_AMBULATORY_CARE_PROVIDER_SITE_OTHER): Payer: Medicare Other | Admitting: Internal Medicine

## 2015-06-10 ENCOUNTER — Encounter: Payer: Self-pay | Admitting: Internal Medicine

## 2015-06-10 VITALS — BP 124/73 | HR 50 | Temp 98.0°F | Wt 147.9 lb

## 2015-06-10 DIAGNOSIS — N649 Disorder of breast, unspecified: Secondary | ICD-10-CM

## 2015-06-10 NOTE — Patient Instructions (Signed)
We will be referring you to a Surgeon for a breast biopsy, if you are the lesion on your breast has not healed by the time of your appointment, then you should have the biopsy done. If if is back to normal, then we will like you to get the biopsy done.

## 2015-06-10 NOTE — Progress Notes (Signed)
Patient ID: [name], female   DOB: [birth year], 49 y.o.   MRN: [id number]   Subjective:   Patient ID: [name] female   DOB: 1965-12-14 49 y.o.   MRN: [id number]  HPI: [name] is a 49 y.o. with PMH listed below, presented today for follow up of her breast lesion. Please see problem based charting for details.  Past Medical History  Diagnosis Date  . Coronary artery disease 02/2008    a. s/p INF STEMI 5/09 tx with BMS to CFX;  b. Lexiscan Myoview 12/10 -  low risk, no ischemia.;   c. s/p NSTEMI 8/12: cath with LM 10%, LAD lum. irregs., oOM1 40%, mOM2 40%, mCFX stent occluded, RCA 50%, EF 45-50%; PCI - s/p DES to mCFX  d. Patent LCx stent, diffuse disease up to 50% in small nondominant RCA, 80% ostial stenosis in small OM3.   . Bipolar disorder      Followed by mental health, who prescribe her bipolar meds  . Cerebral palsy      with history of neuropathic pain  and spasticity,  requiring long-term pain medications  . Hypertension   . GERD (gastroesophageal reflux disease)   . Vitamin D deficiency 11/2009     patient treated with high-dose vitamin D weekly x3 months,  levels were rechecked and it was determined that the patient continued to require high dose repletion of vitamin D.  . History of pelvic mass     Found on CT abd/ pelvis (04/2008) - 13.3 cm cystic pelvis mass, possibly left ovarian origin. To be followed at [facility].   Marland Kitchen HLD (hyperlipidemia)    Current Outpatient Prescriptions  Medication Sig Dispense Refill  . aspirin 81 MG tablet Take 81 mg by mouth daily.      . cholecalciferol (VITAMIN D) 1000 UNITS tablet Take 1,000 Units by mouth daily.    . diazepam (VALIUM) 5 MG tablet Take 1 tablet (5 mg total) by mouth every 6 (six) hours as needed for anxiety. 30 tablet 2  . diphenhydrAMINE-zinc acetate (BENADRYL EXTRA STRENGTH) cream Apply 1 application topically 3 (three) times daily as needed for itching. 28.4 g 0  . metoprolol succinate (TOPROL-XL) 25  MG 24 hr tablet Take 0.5 tablets (12.5 mg total) by mouth daily. 30 tablet 6  . nitroGLYCERIN (NITROSTAT) 0.4 MG SL tablet Place 0.4 mg under the tongue every 5 (five) minutes as needed for chest pain.    . pantoprazole (PROTONIX) 40 MG tablet Take 1 tablet (40 mg total) by mouth daily. (Patient taking differently: Take 40 mg by mouth 2 (two) times daily. ) 30 tablet 3  . simvastatin (ZOCOR) 40 MG tablet Take 1 tablet (40 mg total) by mouth every evening. 30 tablet 6   No current facility-administered medications for this visit.   Family History  Problem Relation Age of Onset  . Lung disease Mother 68    Died of respiratory failure  . Heart attack Father 83   Social History   Social History  . Marital Status: Divorced    Spouse Name: N/A  . Number of Children: N/A  . Years of Education: N/A   Social History Main Topics  . Smoking status: Current Every Day Smoker -- 0.30 packs/day for 25 years    Types: Cigarettes  . Smokeless tobacco: None     Comment: Sometimes less.  . Alcohol Use: 0.0 oz/week    0 Standard drinks or equivalent per week     Comment: Rarely.  Marland Kitchen  Drug Use: No  . Sexual Activity: Not Asked   Other Topics Concern  . None   Social History Narrative   Review of Systems: CONSTITUTIONAL- No Fever, she has been having poor appetite, and over the past few days, because of the biopsy we told her she should do, also haviong loosing some weight- intentional. SKIN- Skin lesion on right breast HEAD- No Headache or dizziness. Mouth/throat- No Sorethroat RESPIRATORY- No Cough or SOB. CARDIAC- No Palpitations, or chest pain. GI- No vomiting, diarrhoea,  abd pain. URINARY- No Frequency, or dysuria.  Objective:  Physical Exam: Filed Vitals:   06/10/15 1110  BP: 124/73  Pulse: 50  Temp: 98 F (36.7 C)  TempSrc: Oral  Weight: 147 lb 14.4 oz (67.087 kg)  SpO2: 100%   GENERAL- alert, co-operative, appears as stated age, not in any distress. HEENT- Atraumatic,  normocephalic, PERRL, EOMI, oral mucosa appears moist, good and intact dentition.  CARDIAC- RRR, no murmurs, rubs or gallops. RESP- Moving equal volumes of air, and clear to auscultation bilaterally, no wheezes or crackles. ABDOMEN- Soft, nontender, bowel sounds present. NEURO- No obvious Cr N abnormality, on a wheel chair EXTREMITIES- pulse 2+, symmetric, no pedal edema. SKIN- Right breast- mild erythema surrounding nipple area, obvious hypertrophic skin with thickening on sup-med aspect of nipple, where prior abscess was. Appears to have healed, with mild surrounding erythema, non tender, no discharge from nipple, no ulcer present, on palpation area above nipple appears indurated- most likely from fibrosis from healing tissue.  PSYCH- Normal mood and affect, appropriate thought content and speech.  Assessment & Plan:  The patient's case and plan of care was discussed with attending physician, Dr. Erlinda Hong.  Please see problem based charting for assessment and plan.

## 2015-06-10 NOTE — Progress Notes (Signed)
Internal Medicine Clinic Attending  I saw and evaluated the patient.  I personally confirmed the key portions of the history and exam documented by Dr. Emokpae and I reviewed pertinent patient test results.  The assessment, diagnosis, and plan were formulated together and I agree with the documentation in the resident's note. 

## 2015-06-10 NOTE — Assessment & Plan Note (Addendum)
Pt says lesion on breast started as a spider bite which she killed on her breast, then the abscess started. Abscess on breast has now resolved. She is not having any pain or drainage from area, except for increased induration in area. She is very worried about getting a biopsy done, and does not want to go it. She believes this is related to the chronic excema that's she usually sees occaisonally on her breast. That resolves  With topical steroids. Ultrasound and Mammogram done- 05/2015 recommended that pt get a breast biopsy done. She denies typical presentation of pagets disease of the breast- scaly, raw, vesicular, or ulcerated lesion that typically begins on the nipple and then spreads to the areola , there is also usually pain, burning, sometimes itching. Pt only endorses itching and dry skin on breast that resolved after steroid cream application.  Plan-  Considering lesion started with a spider bite, without typical symptoms, we will give some time for tissue to heal.  - Will put in referral to General surg now, and by the time referral goes through, will determine if lesion is back to normal skin, or if biopsy is still needed. Pt agrees to plan. - Pt not to apply steriod creams to area, as it might delay wound healing.

## 2015-06-11 NOTE — Addendum Note (Signed)
Addended by: Onnie Boer on: 06/11/2015 10:01 AM   Modules accepted: Orders

## 2015-06-16 NOTE — Addendum Note (Signed)
Addended by: Onnie Boer on: 06/16/2015 08:50 AM   Modules accepted: Orders

## 2015-06-22 ENCOUNTER — Ambulatory Visit: Payer: Self-pay | Admitting: Internal Medicine

## 2015-06-22 ENCOUNTER — Telehealth: Payer: Self-pay | Admitting: *Deleted

## 2015-06-22 NOTE — Telephone Encounter (Signed)
PATIENT WAS CONTACTED WITH HER APPOINTMENT TO [facility] [place] [facility] 9-30-016 @ 10:15AM, TO ARRIVE 10:00AM. PATIENT WAS INSTRUCTED TO CALL THE OFFICE IF UNABLE TO KEEP THIS APPOINTMENT. TO TAKE CO PAY IF NEEDED.

## 2015-07-09 DIAGNOSIS — N61 Inflammatory disorders of breast: Secondary | ICD-10-CM | POA: Diagnosis not present

## 2015-08-20 DIAGNOSIS — N6011 Diffuse cystic mastopathy of right breast: Secondary | ICD-10-CM | POA: Diagnosis not present

## 2015-11-09 ENCOUNTER — Ambulatory Visit
Admission: RE | Admit: 2015-11-09 | Discharge: 2015-11-09 | Disposition: A | Discharge status: Home / Self Care | Payer: Medicare Other | Source: Ambulatory Visit | Attending: Surgery | Admitting: Surgery

## 2015-11-09 ENCOUNTER — Other Ambulatory Visit: Payer: Self-pay | Admitting: Internal Medicine

## 2015-11-09 ENCOUNTER — Other Ambulatory Visit: Payer: Self-pay | Admitting: Surgery

## 2015-11-09 DIAGNOSIS — N6459 Other signs and symptoms in breast: Secondary | ICD-10-CM

## 2015-12-13 ENCOUNTER — Telehealth: Payer: Self-pay | Admitting: Internal Medicine

## 2015-12-13 NOTE — Telephone Encounter (Signed)
APPT REMINDER CALL, LMTCB IF SHE NEEDS TO CANCEL °

## 2015-12-14 ENCOUNTER — Ambulatory Visit (INDEPENDENT_AMBULATORY_CARE_PROVIDER_SITE_OTHER): Payer: Medicare Other | Admitting: Internal Medicine

## 2015-12-14 VITALS — BP 145/82 | HR 61 | Temp 97.5°F | Ht 60.45 in | Wt 134.9 lb

## 2015-12-14 DIAGNOSIS — E785 Hyperlipidemia, unspecified: Secondary | ICD-10-CM

## 2015-12-14 DIAGNOSIS — Z114 Encounter for screening for human immunodeficiency virus [HIV]: Secondary | ICD-10-CM

## 2015-12-14 DIAGNOSIS — I2510 Atherosclerotic heart disease of native coronary artery without angina pectoris: Secondary | ICD-10-CM | POA: Diagnosis not present

## 2015-12-14 DIAGNOSIS — Z0000 Encounter for general adult medical examination without abnormal findings: Secondary | ICD-10-CM

## 2015-12-14 DIAGNOSIS — G4700 Insomnia, unspecified: Secondary | ICD-10-CM | POA: Diagnosis not present

## 2015-12-14 DIAGNOSIS — L853 Xerosis cutis: Secondary | ICD-10-CM | POA: Diagnosis not present

## 2015-12-14 DIAGNOSIS — Z90710 Acquired absence of both cervix and uterus: Secondary | ICD-10-CM

## 2015-12-14 DIAGNOSIS — I10 Essential (primary) hypertension: Secondary | ICD-10-CM

## 2015-12-14 DIAGNOSIS — F17210 Nicotine dependence, cigarettes, uncomplicated: Secondary | ICD-10-CM

## 2015-12-14 DIAGNOSIS — Z8742 Personal history of other diseases of the female genital tract: Secondary | ICD-10-CM

## 2015-12-14 MED ORDER — HYDROCORTISONE 2.5 % EX LOTN: TOPICAL_LOTION | Freq: Two times a day (BID) | CUTANEOUS | Status: DC

## 2015-12-14 MED ORDER — METOPROLOL SUCCINATE ER 25 MG PO TB24: 12.5000 mg | ORAL_TABLET | Freq: Every day | ORAL | Status: DC

## 2015-12-14 MED ORDER — ATORVASTATIN CALCIUM 20 MG PO TABS: 20.0000 mg | ORAL_TABLET | Freq: Every day | ORAL | Status: DC

## 2015-12-14 MED ORDER — ZOLPIDEM TARTRATE 5 MG PO TABS: 5.0000 mg | ORAL_TABLET | Freq: Every evening | ORAL | Status: DC | PRN

## 2015-12-14 NOTE — Assessment & Plan Note (Signed)
No chest pain today. She has been taking her aspirin, but hasnot taken her metoprolol or simvastatin in at least 2 months. She ha sbeen resistant to taking the statin, and insisting she will work [employer] diet.   Plan- Pt agrees to day to take statin, will switch to mod intensity statin- 20mg  daily, she should be on a high intensity but can increase dose as tolerated to prevent statin myopathy or side effects that would prevent pt from taking med altogether. - Reiterrated need for BB, even if BB is normal- cont metop- 12.5mg  daily.

## 2015-12-14 NOTE — Progress Notes (Signed)
Patient ID: [name], female   DOB: 06/13/1966, 50 y.o.   MRN: [id number]   Subjective:   Patient ID: Victoria CheneyDonna G [name] female   DOB: 07/27/1966 50 y.o.   MRN: [id number]  HPI: [name] is a 50 y.o. with PMH listed below, presented today to follow up on her CAD, HTN and insomnia.  Past Medical History  Diagnosis Date  . Coronary artery disease 02/2008    a. s/p INF STEMI 5/09 tx with BMS to CFX;  b. Lexiscan Myoview 12/10 -  low risk, no ischemia.;   c. s/p NSTEMI 8/12: cath with LM 10%, LAD lum. irregs., oOM1 40%, mOM2 40%, mCFX stent occluded, RCA 50%, EF 45-50%; PCI - s/p DES to mCFX  d. Patent LCx stent, diffuse disease up to 50% in small nondominant RCA, 80% ostial stenosis in small OM3.   . Bipolar disorder      Followed by mental health, who prescribe her bipolar meds  . Cerebral palsy      with history of neuropathic pain  and spasticity,  requiring long-term pain medications  . Hypertension   . GERD (gastroesophageal reflux disease)   . Vitamin D deficiency 11/2009     patient treated with high-dose vitamin D weekly x3 months,  levels were rechecked and it was determined that the patient continued to require high dose repletion of vitamin D.  . History of pelvic mass     Found on CT abd/ pelvis (04/2008) - 13.3 cm cystic pelvis mass, possibly left ovarian origin. [facility].   Marland Kitchen. HLD (hyperlipidemia)    Review of Systems: CONSTITUTIONAL- No Fever, weightloss, night sweat or change in appetite. SKIN- No Rash, colour changes or itching. HEAD- No Headache or dizziness. EYES- No Vision loss, pain, redness, double or blurred vision. EARS- No vertigo, hearing loss or ear discharge. Mouth/throat- No Sorethroat, dentures, or bleeding gums. RESPIRATORY- No Cough or SOB. CARDIAC- No Palpitations, DOE, PND or chest pain. GI- No nausea, vomiting, diarrhoea, constipation, abd pain. URINARY- No Frequency, urgency, straining or dysuria. NEUROLOGIC- No  Numbness, syncope, seizures or burning. [facility]- Denies depression or anxiety.  Objective:  Physical Exam: Filed Vitals:   12/14/15 1432  BP: 145/82  Pulse: 61  Temp: 97.5 F (36.4 C)  TempSrc: Oral  Height: 5' 0.45" (1.535 m)  Weight: 134 lb 14.4 oz (61.19 kg)  SpO2: 100%   GENERAL- alert, co-operative, appears as stated age, not in any distress. HEENT- Atraumatic, normocephalic, PERRL, oral mucosa appears moist,  neck supple. CARDIAC- RRR, no murmurs, rubs or gallops.  Breast- previous skin lesion appears well healed without scarring, left breast. RESP- Moving equal volumes of air, and no wheezes or crackles. ABDOMEN- Soft, nontender, no palpable masses or organomegaly, bowel sounds present. NEURO- No obvious Cr N abnormality, bilateral lower extremity weakness- in a wheel chair. EXTREMITIES- pulse 2+, symmetric, no pedal edema. PSYCH- Normal mood and affect, appropriate thought content and speech.  Assessment & Plan:   The patient's case and plan of care was discussed with attending physician, Dr. Heide SparkNarendra.  Please see problem based charting for assessment and plan.

## 2015-12-14 NOTE — Assessment & Plan Note (Signed)
BP Readings from Last 3 Encounters:  12/14/15 145/82  06/10/15 124/73  05/21/15 145/78    Lab Results  Component Value Date   NA 139 03/23/2015   K 4.0 03/23/2015   CREATININE 0.50 03/23/2015    Assessment: Blood pressure control:  mildly elevated, has not taken metop in at least 2 months. Comments: Cont metoprolol 12.5mg  daily Monitor pulse, has hx of bradycardia.

## 2015-12-14 NOTE — Assessment & Plan Note (Signed)
She had her ovaries removed in 2009 after mass thought to be from her ovaries was discovered on CT scan. She has had a hysterectomy in the past- 2004. Abdomen is benign today.

## 2015-12-14 NOTE — Patient Instructions (Signed)
We will be changing your cholesterol medications to lipitor, this further reduces your risk of having another heart attack. Please continue the medication for your blood pressure called metoprolol, this also reduces your risk of having another heart attack.   Also it is important that you taking your aspirin everyday.  Also important to quit smoking.   Apply vaseline to your body. Avoid taking long hot showers or bath.

## 2015-12-14 NOTE — Assessment & Plan Note (Signed)
Represcribed ambien- 5mg  QHS. D/c valium.

## 2015-12-14 NOTE — Assessment & Plan Note (Addendum)
Smoking 1/2 a pack per day, she has cut down from 1PPD. She has lost a significant amount of weight. She refused low dose CT scan to screen for lung cancer. She says her weight loss is intentional. She started smoking at the age of 50. Weight- 178- 2015, 134 today. She has no chest pain, cough or SOB.  Plan- Emphasized quitting cig use, she is not ready - Meets criteria for low dose-CT Lung cancer screen, she refused today.

## 2015-12-14 NOTE — Assessment & Plan Note (Signed)
Generalized dry skin flaking. Itching worse in elbows and upper back. She ha stried OTC hydrocortisone with no relief.  Plan- Apply vaseline to affected areas - Hydrocortisone cream- 2.5% to elbows and upper back.

## 2015-12-14 NOTE — Assessment & Plan Note (Addendum)
Refused colonoscopy, and lung cancer screen with low dose CT scan. She has lost quit a bit of weight in the past 2 years, consider reiterating the importance of screen at next visit.  Plan- Stool cards X3. - Hep C  - HIV screen

## 2015-12-14 NOTE — Assessment & Plan Note (Addendum)
D/c simvastatin, start moderate intensity statin 20mg  daily of lipitor.

## 2015-12-15 LAB — HEPATITIS C ANTIBODY

## 2015-12-15 LAB — HIV ANTIBODY (ROUTINE TESTING W REFLEX): HIV SCREEN 4TH GENERATION: NONREACTIVE

## 2015-12-17 NOTE — Progress Notes (Signed)
Internal Medicine Clinic Attending  Case discussed with Dr. Emokpae soon after the resident saw the patient.  We reviewed the resident's history and exam and pertinent patient test results.  I agree with the assessment, diagnosis, and plan of care documented in the resident's note. 

## 2016-03-24 ENCOUNTER — Encounter: Payer: Self-pay | Admitting: *Deleted

## 2016-04-17 ENCOUNTER — Ambulatory Visit: Payer: Self-pay

## 2016-04-18 ENCOUNTER — Ambulatory Visit (INDEPENDENT_AMBULATORY_CARE_PROVIDER_SITE_OTHER): Payer: Medicare Other | Admitting: Internal Medicine

## 2016-04-18 ENCOUNTER — Encounter: Payer: Self-pay | Admitting: Internal Medicine

## 2016-04-18 VITALS — BP 148/110 | HR 58 | Temp 98.2°F | Ht 65.0 in | Wt 141.8 lb

## 2016-04-18 DIAGNOSIS — G809 Cerebral palsy, unspecified: Secondary | ICD-10-CM

## 2016-04-18 DIAGNOSIS — R7303 Prediabetes: Secondary | ICD-10-CM

## 2016-04-18 DIAGNOSIS — M25559 Pain in unspecified hip: Secondary | ICD-10-CM

## 2016-04-18 DIAGNOSIS — M25551 Pain in right hip: Secondary | ICD-10-CM | POA: Diagnosis not present

## 2016-04-18 DIAGNOSIS — M25552 Pain in left hip: Secondary | ICD-10-CM

## 2016-04-18 DIAGNOSIS — R51 Headache: Secondary | ICD-10-CM

## 2016-04-18 DIAGNOSIS — G43009 Migraine without aura, not intractable, without status migrainosus: Secondary | ICD-10-CM

## 2016-04-18 DIAGNOSIS — Z993 Dependence on wheelchair: Secondary | ICD-10-CM

## 2016-04-18 DIAGNOSIS — G43909 Migraine, unspecified, not intractable, without status migrainosus: Secondary | ICD-10-CM | POA: Insufficient documentation

## 2016-04-18 DIAGNOSIS — G44001 Cluster headache syndrome, unspecified, intractable: Secondary | ICD-10-CM | POA: Insufficient documentation

## 2016-04-18 DIAGNOSIS — G8929 Other chronic pain: Secondary | ICD-10-CM | POA: Diagnosis not present

## 2016-04-18 LAB — POCT GLYCOSYLATED HEMOGLOBIN (HGB A1C): Hemoglobin A1C: 5.7

## 2016-04-18 LAB — GLUCOSE, CAPILLARY: Glucose-Capillary: 62 mg/dL — ABNORMAL LOW (ref 65–99)

## 2016-04-18 MED ORDER — TRAMADOL HCL 50 MG PO TABS: 50.0000 mg | ORAL_TABLET | Freq: Four times a day (QID) | ORAL | Status: DC | PRN

## 2016-04-18 NOTE — Assessment & Plan Note (Signed)
Patient has bilateral hip pain related to imobility, wheel chair use and cerebral palsy. She is able to ambulate and does a great job at home. However, she has chronic hip pain in both joints bilaterally. This condition is active and stable without new concerning red flag symptoms.She uses about 10 tramadol per month for days when here pain is severe. -- Continue ambulation as tolerated -- Tramadol 50mg  PRN for pain, I wrote for 30 pills without a refill

## 2016-04-18 NOTE — Patient Instructions (Signed)
It was a pleasure seeing you today. [facility] for [facility].  -- Use tramadol as needed for pain -- Use aleve migraine as needed for migraines but do not use other NSAIDs including ibuprofen and regular aleve -- Will follow-up concerning blood work

## 2016-04-18 NOTE — Progress Notes (Signed)
CC: Routine follow-up appointment HPI: [name] is a 50 y.o. female with a h/o of HTN, CAD, Migraines and cerebral palsy who presents as a routine follow-up appointment. At the visit today she complains of new migraines for the last two months. They are very similar to her migraines in the past. During these episodes she has nausea, photophobia and phonophobia. She does not vomit. Her headaches are improved with NSAIDs. She has had about 2 per week for the last two months. During these episodes she does not have mental status changes or focal neurological deficits. She has no red flag symptoms with these headaches and says they feel exactly as they do in the past. Also, she complains of continued bilateral hip pain. This condition is chronic and has not worsened. This is due to her immobility and cerebral palsy. Her pain is improved with ambulation and with NSAID use. She also endorses polydipsia and polyuria for the last one month and request that we check her for diabetes today. She denies chest pain, shortness of breath, fevers, chills, weight loss, and night sweats.   She has no additional acute concerns at today's visit.    Past Medical History  Diagnosis Date  . Coronary artery disease 02/2008    a. s/p INF STEMI 5/09 tx with BMS to CFX;  b. Lexiscan Myoview 12/10 -  low risk, no ischemia.;   c. s/p NSTEMI 8/12: cath with LM 10%, LAD lum. irregs., oOM1 40%, mOM2 40%, mCFX stent occluded, RCA 50%, EF 45-50%; PCI - s/p DES to mCFX  d. Patent LCx stent, diffuse disease up to 50% in small nondominant RCA, 80% ostial stenosis in small OM3.   . Bipolar disorder (HCC)      Followed by mental health, who prescribe her bipolar meds  . Cerebral palsy (HCC)      with history of neuropathic pain  and spasticity,  requiring long-term pain medications  . Hypertension   . GERD (gastroesophageal reflux disease)   . Vitamin D deficiency 11/2009     patient treated with high-dose vitamin D  weekly x3 months,  levels were rechecked and it was determined that the patient continued to require high dose repletion of vitamin D.  . History of pelvic mass     Found on CT abd/ pelvis (04/2008) - 13.3 cm cystic pelvis mass, possibly left ovarian origin. To be followed at [facility].   Marland Kitchen HLD (hyperlipidemia)    Current Outpatient Rx  Name  Route  Sig  Dispense  Refill  . aspirin 81 MG tablet   Oral   Take 81 mg by mouth daily.           Marland Kitchen atorvastatin (LIPITOR) 20 MG tablet   Oral   Take 1 tablet (20 mg total) by mouth daily.   30 tablet   6   . cholecalciferol (VITAMIN D) 1000 UNITS tablet   Oral   Take 1,000 Units by mouth daily.         . diphenhydrAMINE-zinc acetate (BENADRYL EXTRA STRENGTH) cream   Topical   Apply 1 application topically 3 (three) times daily as needed for itching.   28.4 g   0   . hydrocortisone 2.5 % lotion   Topical   Apply topically 2 (two) times daily.   59 mL   0   . metoprolol succinate (TOPROL-XL) 25 MG 24 hr tablet   Oral   Take 0.5 tablets (12.5 mg total) by mouth daily.  30 tablet   6   . nitroGLYCERIN (NITROSTAT) 0.4 MG SL tablet   Sublingual   Place 0.4 mg under the tongue every 5 (five) minutes as needed for chest pain.         . pantoprazole (PROTONIX) 40 MG tablet   Oral   Take 1 tablet (40 mg total) by mouth daily. Patient taking differently: Take 40 mg by mouth 2 (two) times daily.    30 tablet   3   . traMADol (ULTRAM) 50 MG tablet   Oral   Take 1 tablet (50 mg total) by mouth every 6 (six) hours as needed.   30 tablet   0   . EXPIRED: zolpidem (AMBIEN) 5 MG tablet   Oral   Take 1 tablet (5 mg total) by mouth at bedtime as needed for sleep.   30 tablet   2      Review of Systems: A complete ROS was negative except as per HPI.  Physical Exam: Filed Vitals:   04/18/16 1521  BP: 148/110  Pulse: 58  Temp: 98.2 F (36.8 C)  TempSrc: Oral  Height: 5\' 5"  (1.651 m)  Weight: 141 lb 12.8 oz (64.32 kg)    SpO2: 100%   General appearance: alert and cooperative Head: Normocephalic, without obvious abnormality, atraumatic Lungs: clear to auscultation bilaterally Heart: regular rate and rhythm, S1, S2 normal, no murmur, click, rub or gallop Abdomen: soft, non-tender; bowel sounds normal; no masses,  no organomegaly  Assessment & Plan:  See encounters tab for problem based medical decision making. Patient seen with Dr. Criselda PeachesMullen  Signed: Thomasene LotJames Boluwatife Mutchler, MD 04/18/2016, 4:30 PM  Pager: [phone]

## 2016-04-18 NOTE — Progress Notes (Signed)
error 

## 2016-04-18 NOTE — Assessment & Plan Note (Addendum)
Patient has had migraines in the past. She recently developed them again about 2 months ago. She has two episodes per week. They are associated with nausea, photophobia, and phonophobia. She has not had any episodes of vomiting. She says they feel just like her headaches in the past. There are no concerning symptoms associated with these headaches. We will continue to monitor her headaches but do not think any further workup is needed at this time. -- She will try OTC migraine medications during acute episodes -- We discussed avoiding chronic NSAID use and if using Aleve for migraines she will not use other forms of NSAIDS -- We will continue to monitor these headaches at her next visit

## 2016-04-18 NOTE — Assessment & Plan Note (Signed)
Patient is stable with this condition. She does extremely well and is able to walk around her house. -- Continue to monitor

## 2016-04-20 NOTE — Progress Notes (Signed)
Internal Medicine Clinic Attending  I saw and evaluated the patient.  I personally confirmed the key portions of the history and exam documented by Dr. Ladona Ridgelaylor and I reviewed pertinent patient test results.  The assessment, diagnosis, and plan were formulated together and I agree with the documentation in the resident's note.  I think after seeing and speaking with the patient she has chronic daily headache, likely from chronic analgesia use.  I advised her to try the OTC migraine therapy and then we will need to try withdrawing from NSAIDs and controlling hip pain another way.  This was discussed as well in Dr. Thad Rangereynolds note from a few years ago.  The severity of these new headaches may be related to migraine, but may also be worsening of withdrawal headaches.

## 2016-05-23 ENCOUNTER — Other Ambulatory Visit: Payer: Self-pay | Admitting: Internal Medicine

## 2016-05-23 DIAGNOSIS — Z1231 Encounter for screening mammogram for malignant neoplasm of breast: Secondary | ICD-10-CM

## 2016-05-29 ENCOUNTER — Encounter: Payer: Self-pay | Admitting: Internal Medicine

## 2016-05-29 ENCOUNTER — Ambulatory Visit (INDEPENDENT_AMBULATORY_CARE_PROVIDER_SITE_OTHER): Payer: Medicare Other | Admitting: Internal Medicine

## 2016-05-29 ENCOUNTER — Encounter (INDEPENDENT_AMBULATORY_CARE_PROVIDER_SITE_OTHER): Payer: Self-pay

## 2016-05-29 VITALS — BP 143/75 | HR 61 | Temp 98.4°F | Ht 64.5 in | Wt 138.0 lb

## 2016-05-29 DIAGNOSIS — J069 Acute upper respiratory infection, unspecified: Secondary | ICD-10-CM | POA: Insufficient documentation

## 2016-05-29 DIAGNOSIS — F17210 Nicotine dependence, cigarettes, uncomplicated: Secondary | ICD-10-CM

## 2016-05-29 MED ORDER — GUAIFENESIN-DM 100-10 MG/5ML PO SYRP: 5.0000 mL | ORAL_SOLUTION | ORAL | 0 refills | Status: DC | PRN

## 2016-05-29 MED ORDER — FLUTICASONE PROPIONATE 50 MCG/ACT NA SUSP: 2.0000 | Freq: Every day | NASAL | 0 refills | Status: DC

## 2016-05-29 MED ORDER — GUAIFENESIN-CODEINE 100-10 MG/5ML PO SOLN: 10.0000 mL | Freq: Every evening | ORAL | 0 refills | Status: DC | PRN

## 2016-05-29 NOTE — Progress Notes (Signed)
    CC: cough  HPI: Ms.Florean G Derego is a 50 y.o. female with PMHx of CAD, HTN and GERD who presents to the clinic for cough.   Patient complains of a 3 week history of cough. Patient states that 3 weeks ago she developed a cough, mostly non-productive, only occasional mucous production, and associated sore throat and malaise. Sore throat and malaise have improved, but she continues to have a dry cough which keeps her up at night. She denies associated fever, chills, shortness of breath, wheezing, nausea, vomiting or diarrhea. She has been exposed to sick contacts including her daughter and granddaughter. She has tried chlorpheniramine and acetaminophen without relief.   Past Medical History:  Diagnosis Date  . Bipolar disorder (HCC)     Followed by mental health, who prescribe her bipolar meds  . Cerebral palsy (HCC)     with history of neuropathic pain  and spasticity,  requiring long-term pain medications  . Coronary artery disease 02/2008   a. s/p INF STEMI 5/09 tx with BMS to CFX;  b. Lexiscan Myoview 12/10 -  low risk, no ischemia.;   c. s/p NSTEMI 8/12: cath with LM 10%, LAD lum. irregs., oOM1 40%, mOM2 40%, mCFX stent occluded, RCA 50%, EF 45-50%; PCI - s/p DES to mCFX  d. Patent LCx stent, diffuse disease up to 50% in small nondominant RCA, 80% ostial stenosis in small OM3.   Marland Kitchen. GERD (gastroesophageal reflux disease)   . History of pelvic mass    Found on CT abd/ pelvis (04/2008) - 13.3 cm cystic pelvis mass, possibly left ovarian origin. [facility].   Marland Kitchen. HLD (hyperlipidemia)   . Hypertension   . Vitamin D deficiency 11/2009    patient treated with high-dose vitamin D weekly x3 months,  levels were rechecked and it was determined that the patient continued to require high dose repletion of vitamin D.    Review of Systems: A complete ROS was negative except as noted in HPI.   Physical Exam: Vitals:   05/29/16 1346  BP: (!) 143/75  Pulse: 61  Temp: 98.4 F (36.9  C)  TempSrc: Oral  SpO2: 100%  Weight: 138 lb (62.6 kg)  Height: 5' 4.5" (1.638 m)   General: Vital signs reviewed.  Patient is well-developed and well-nourished, in no acute distress and cooperative with exam.  HEENT: Normal conjunctiva. PERRLA. Erythematous, edematous nasal turbinates. Mildly erythematous posterior oropharynx.  Cardiovascular: RRR, S1 normal, S2 normal, no murmurs, gallops, or rubs. Pulmonary/Chest: Clear to auscultation bilaterally, no wheezes, rales, or rhonchi. Abdominal: Soft, non-tender, non-distended, BS + Skin: Warm, dry and intact. No rashes or erythema. Psychiatric: Normal mood and affect. speech and behavior is normal. Cognition and memory are normal.   Assessment & Plan:  See encounters tab for problem based medical decision making. Patient discussed with Dr. Oswaldo DoneVincent

## 2016-05-29 NOTE — Assessment & Plan Note (Addendum)
Patient complains of a 3 week history of cough. Patient states that 3 weeks ago she developed a cough, mostly non-productive, only occasional mucous production, and associated sore throat and malaise. Sore throat and malaise have improved, but she continues to have a dry cough which keeps her up at night. She denies associated fever, chills, shortness of breath, wheezing, nausea, vomiting or diarrhea. She has been exposed to sick contacts including her daughter and granddaughter. She has tried chlorpheniramine and acetaminophen without relief. She has a 30 pack year history of tobacco abuse. She denies hemoptysis.  Assessment: Acute Viral URI with lasting cough  Plan: -No role for antibiotics -Robitussin DM Q4H during daytime -Robitussin AC QHS prn at nighttime (do not take Ambien) -Flonase 2 sprays each nostril BID -Follow up if no improvement

## 2016-05-29 NOTE — Patient Instructions (Addendum)
USE ROBITUSSIN DM DURING THE DAY EVERY 6 HOURS AS NEEDED FOR COUGH.  USE FLONASE NASAL SPRAY 2 SPRAYS EACH NOSTRIL ONCE A DAY.  USE ROBITUSSIN AC (WITH CODEINE) AT NIGHTTIME TO HELP WITH YOUR  COUGH. YOUR COUGH SHOULD RESOLVE IN ABOUT 1 WEEK. PLEASE DO NOT TAKE THE ROBITUSSIN WITH CODEINE WITH YOUR AMBIEN.

## 2016-05-30 NOTE — Progress Notes (Signed)
Internal Medicine Clinic Attending  Case discussed with Dr. Burns at the time of the visit.  We reviewed the resident's history and exam and pertinent patient test results.  I agree with the assessment, diagnosis, and plan of care documented in the resident's note.  

## 2016-05-31 ENCOUNTER — Ambulatory Visit
Admission: RE | Admit: 2016-05-31 | Discharge: 2016-05-31 | Disposition: A | Discharge status: Home / Self Care | Payer: Medicare Other | Source: Ambulatory Visit | Attending: Internal Medicine | Admitting: Internal Medicine

## 2016-05-31 DIAGNOSIS — Z1231 Encounter for screening mammogram for malignant neoplasm of breast: Secondary | ICD-10-CM

## 2016-09-08 ENCOUNTER — Other Ambulatory Visit: Payer: Self-pay

## 2016-09-08 DIAGNOSIS — I10 Essential (primary) hypertension: Secondary | ICD-10-CM

## 2016-09-08 NOTE — Telephone Encounter (Signed)
metoprolol succinate (TOPROL-XL) 25 MG 24 hr tablet, refill request @ [facility] on [address].

## 2016-09-09 MED ORDER — METOPROLOL SUCCINATE ER 25 MG PO TB24: 12.5000 mg | ORAL_TABLET | Freq: Every day | ORAL | 0 refills | Status: DC

## 2016-10-19 ENCOUNTER — Encounter: Payer: Self-pay | Admitting: Internal Medicine

## 2016-10-19 ENCOUNTER — Ambulatory Visit (INDEPENDENT_AMBULATORY_CARE_PROVIDER_SITE_OTHER): Payer: Medicare Other | Admitting: Internal Medicine

## 2016-10-19 DIAGNOSIS — Z955 Presence of coronary angioplasty implant and graft: Secondary | ICD-10-CM

## 2016-10-19 DIAGNOSIS — I10 Essential (primary) hypertension: Secondary | ICD-10-CM

## 2016-10-19 DIAGNOSIS — I252 Old myocardial infarction: Secondary | ICD-10-CM

## 2016-10-19 DIAGNOSIS — Z0000 Encounter for general adult medical examination without abnormal findings: Secondary | ICD-10-CM

## 2016-10-19 DIAGNOSIS — Z7982 Long term (current) use of aspirin: Secondary | ICD-10-CM

## 2016-10-19 DIAGNOSIS — I2510 Atherosclerotic heart disease of native coronary artery without angina pectoris: Secondary | ICD-10-CM

## 2016-10-19 DIAGNOSIS — Z993 Dependence on wheelchair: Secondary | ICD-10-CM

## 2016-10-19 DIAGNOSIS — F17210 Nicotine dependence, cigarettes, uncomplicated: Secondary | ICD-10-CM

## 2016-10-19 DIAGNOSIS — M25551 Pain in right hip: Secondary | ICD-10-CM

## 2016-10-19 DIAGNOSIS — Z79899 Other long term (current) drug therapy: Secondary | ICD-10-CM

## 2016-10-19 DIAGNOSIS — G809 Cerebral palsy, unspecified: Secondary | ICD-10-CM

## 2016-10-19 DIAGNOSIS — M25552 Pain in left hip: Secondary | ICD-10-CM | POA: Diagnosis not present

## 2016-10-19 DIAGNOSIS — Z79891 Long term (current) use of opiate analgesic: Secondary | ICD-10-CM

## 2016-10-19 MED ORDER — TRAMADOL HCL 50 MG PO TABS: 50.0000 mg | ORAL_TABLET | Freq: Four times a day (QID) | ORAL | 0 refills | Status: DC | PRN

## 2016-10-19 MED ORDER — ROSUVASTATIN CALCIUM 10 MG PO TABS: 10.0000 mg | ORAL_TABLET | Freq: Every day | ORAL | 11 refills | Status: DC

## 2016-10-19 MED ORDER — SIMVASTATIN 20 MG PO TABS: 20.0000 mg | ORAL_TABLET | Freq: Every day | ORAL | 3 refills | Status: DC

## 2016-10-19 MED ORDER — PANTOPRAZOLE SODIUM 40 MG PO TBEC: 40.0000 mg | DELAYED_RELEASE_TABLET | Freq: Every day | ORAL | 3 refills | Status: DC

## 2016-10-19 MED ORDER — METOPROLOL SUCCINATE ER 25 MG PO TB24: 12.5000 mg | ORAL_TABLET | Freq: Every day | ORAL | 0 refills | Status: DC

## 2016-10-19 NOTE — Patient Instructions (Signed)
It was a pleasure seeing you today. [facility] for [facility].   You're doing a great job. Please continue to take your medication as prescribed. Please continue to take aspirin 81 mg once daily for the prevention of heart disease.  I will see back in clinic in 3 months or sooner if needed.

## 2016-10-19 NOTE — Progress Notes (Signed)
Internal Medicine Clinic Attending  Case discussed with Dr. Taylor at the time of the visit.  We reviewed the resident's history and exam and pertinent patient test results.  I agree with the assessment, diagnosis, and plan of care documented in the resident's note. 

## 2016-10-19 NOTE — Assessment & Plan Note (Signed)
I discussed with the patient the importance of colon cancer screening. I discussed multiple options including CT colonoscopy, colonoscopy and stool testing. She does not wish to proceed with colon cancer screening at this time. She states that she may be willing to have a colonoscopy next year. Please continue to address these issues with her. -- Continue to discuss colon cancer screening recommendations -- Consider colonoscopy in 1 year per patient

## 2016-10-19 NOTE — Addendum Note (Signed)
Addended by: Kathleen ArgueAYLOR, Granville Whitefield M on: 10/19/2016 02:39 PM   Modules accepted: Orders

## 2016-10-19 NOTE — Progress Notes (Signed)
   CC: Hip Pain  HPI: [name] is a 51 y.o. female with a h/o of hypertension, coronary artery disease, chronic migraines and cerebral palsy who presents with continued hip pain. Does endorse some headaches. No n/v, abdominal pain, diarrhea, constipation, dysuria, and weight loss. She has no additional acute complaints or concerns at today's visit.   Review of Systems: A complete ROS was negative except as per HPI.  Physical Exam: Vitals:   10/19/16 1348  BP: (!) 150/78  Pulse: (!) 57  Temp: 98 F (36.7 C)  TempSrc: Oral  SpO2: 100%  Weight: 145 lb 3.2 oz (65.9 kg)  Height: 5\' 5"  (1.651 m)   General appearance: alert, cooperative and Patient in wheelchair secondary to cerebral palsy Head: Normocephalic, without obvious abnormality, atraumatic Lungs: clear to auscultation bilaterally Heart: regular rate and rhythm, S1, S2 normal, no murmur, click, rub or gallop Abdomen: soft, non-tender; bowel sounds normal; no masses,  no organomegaly Extremities: No edema lower extremities extremely frail and thin  Assessment & Plan:  See encounters tab for problem based medical decision making. Patient discussed with Dr. Heide SparkNarendra  Signed: Thomasene LotJames Stephano Arrants, MD 10/19/2016, 2:01 PM  Pager: [phone]

## 2016-10-19 NOTE — Assessment & Plan Note (Signed)
Patient has a history of coronary artery disease and hypertension. She is currently taking metoprolol extended release 12.5 mg once daily. Blood pressure in the office today was 130/70. She is currently at goal. We'll not make any adjustments to her antihypertensive medication regimen. At this time the choice of metoprolol has stool indications for both hypertension and mortality benefit in patients with coronary artery disease. We will continue with her current medication regimen. -- Metoprolol 12.5 mg once daily

## 2016-10-19 NOTE — Addendum Note (Signed)
Addended by: Kathleen ArgueAYLOR, Kamora Vossler M on: 10/19/2016 03:18 PM   Modules accepted: Orders

## 2016-10-19 NOTE — Assessment & Plan Note (Addendum)
Patient has a history of coronary artery disease. She is currently taking a statin as well as aspirin 81 mg once daily. Her statin was recently switched from simvastatin to atorvastatin. The patient states she is not able to tolerate the atorvastatin as well as it seems to give her headaches. She denies muscle tenderness or increased weakness. She wishes to go back to simvastatin. I will switch the patient back to rosuvastatin at this time. Also, I discussed with the patient the importance of continuing aspirin 81 mg daily for the secondary prevention of cardiovascular disease. And following evidence based guidelines for the treatment of coronary artery disease status post inferior STEMI and stent placement we will continue the patient on a beta blocker, a statin and aspirin. -- Metoprolol 12.5 mg once daily -- Aspirin 81 mg once daily -- Switched from atorvastatin 20 mg once daily to rosuvastatin 10 mg once daily

## 2016-10-19 NOTE — Assessment & Plan Note (Signed)
Patient has cerebral palsy. She is able to ambulate but spends majority of time in a wheelchair. She has bilateral hip pain secondary to this. The hip pain is doing better today. She uses tramadol on an as-needed basis for the treatment of this hip pain. I will refill this medication as 45 pill seems to last her several months. -- Tramadol when necessary hip pain

## 2016-10-24 ENCOUNTER — Telehealth: Payer: Self-pay

## 2016-10-24 NOTE — Telephone Encounter (Signed)
Pt wanted to know how to have scripts transferred to [facility], informed her on how, call ended

## 2016-10-24 NOTE — Telephone Encounter (Signed)
Please call pt back regarding meds.  

## 2016-11-03 ENCOUNTER — Other Ambulatory Visit: Payer: Self-pay

## 2016-11-03 MED ORDER — ROSUVASTATIN CALCIUM 10 MG PO TABS: 10.0000 mg | ORAL_TABLET | Freq: Every day | ORAL | 11 refills | Status: DC

## 2016-11-03 MED ORDER — NITROGLYCERIN 0.4 MG SL SUBL: 0.4000 mg | SUBLINGUAL_TABLET | SUBLINGUAL | 0 refills | Status: DC | PRN

## 2016-11-03 NOTE — Telephone Encounter (Signed)
nitroGLYCERIN (NITROSTAT) 0.4 MG SL tablet,  rosuvastatin (CRESTOR) 10 MG tablet, refill request @ [facility] on [address].

## 2016-11-07 ENCOUNTER — Other Ambulatory Visit: Payer: Self-pay | Admitting: Internal Medicine

## 2016-11-07 ENCOUNTER — Telehealth: Payer: Self-pay | Admitting: *Deleted

## 2016-11-07 NOTE — Telephone Encounter (Signed)
See other note

## 2016-11-07 NOTE — Telephone Encounter (Signed)
Tiffany had relayed that [address] [facility] called and wanted last scripts that were sent to [facility] to be given, she told the person calling that they could be transferred, the person told her no they could not be, she called to me to tell me about the call and in the process the pharm hung up, I called [facility], spoke to the pharmacist and he stated he would call [facility] and have the scripts transferred, he states [facility] [place] will not send the profiles of transferring pts to them but they will give him the scripts he is hoping to speak to a pharmacist there that he has a good relationship with and get the pt profile but states he will take care of it and he is sorry for the confusion

## 2016-11-07 NOTE — Telephone Encounter (Signed)
rosuvastatin (CRESTOR) 10 MG tablet nitroGLYCERIN (NITROSTAT) 0.4 MG SL tablet  Refills sent to incorrect pharmacy need to be sent to [facility]

## 2017-02-08 ENCOUNTER — Encounter: Payer: Self-pay | Admitting: Internal Medicine

## 2017-03-08 ENCOUNTER — Ambulatory Visit (INDEPENDENT_AMBULATORY_CARE_PROVIDER_SITE_OTHER): Payer: Medicare Other | Admitting: Internal Medicine

## 2017-03-08 ENCOUNTER — Encounter: Payer: Self-pay | Admitting: Internal Medicine

## 2017-03-08 VITALS — BP 157/75 | HR 57 | Temp 98.2°F | Ht 64.0 in | Wt 158.9 lb

## 2017-03-08 DIAGNOSIS — G809 Cerebral palsy, unspecified: Secondary | ICD-10-CM

## 2017-03-08 DIAGNOSIS — M25559 Pain in unspecified hip: Secondary | ICD-10-CM

## 2017-03-08 DIAGNOSIS — M25552 Pain in left hip: Secondary | ICD-10-CM

## 2017-03-08 DIAGNOSIS — G801 Spastic diplegic cerebral palsy: Secondary | ICD-10-CM

## 2017-03-08 DIAGNOSIS — M25551 Pain in right hip: Secondary | ICD-10-CM

## 2017-03-08 DIAGNOSIS — Z79891 Long term (current) use of opiate analgesic: Secondary | ICD-10-CM | POA: Diagnosis not present

## 2017-03-08 DIAGNOSIS — R7303 Prediabetes: Secondary | ICD-10-CM | POA: Diagnosis not present

## 2017-03-08 DIAGNOSIS — I2510 Atherosclerotic heart disease of native coronary artery without angina pectoris: Secondary | ICD-10-CM

## 2017-03-08 DIAGNOSIS — F17210 Nicotine dependence, cigarettes, uncomplicated: Secondary | ICD-10-CM | POA: Diagnosis not present

## 2017-03-08 DIAGNOSIS — Z79899 Other long term (current) drug therapy: Secondary | ICD-10-CM | POA: Diagnosis not present

## 2017-03-08 DIAGNOSIS — G8929 Other chronic pain: Secondary | ICD-10-CM | POA: Diagnosis not present

## 2017-03-08 DIAGNOSIS — I10 Essential (primary) hypertension: Secondary | ICD-10-CM

## 2017-03-08 DIAGNOSIS — Z0000 Encounter for general adult medical examination without abnormal findings: Secondary | ICD-10-CM

## 2017-03-08 LAB — GLUCOSE, CAPILLARY: GLUCOSE-CAPILLARY: 77 mg/dL (ref 65–99)

## 2017-03-08 LAB — POCT GLYCOSYLATED HEMOGLOBIN (HGB A1C): Hemoglobin A1C: 5.6

## 2017-03-08 MED ORDER — AMLODIPINE BESYLATE 5 MG PO TABS: 5.0000 mg | ORAL_TABLET | Freq: Every day | ORAL | 2 refills | Status: DC

## 2017-03-08 MED ORDER — TRAMADOL HCL 50 MG PO TABS: 50.0000 mg | ORAL_TABLET | Freq: Four times a day (QID) | ORAL | 0 refills | Status: DC | PRN

## 2017-03-08 NOTE — Patient Instructions (Signed)
It was a pleasure seeing you today. [facility]   I have started a new medicine for your blood pressure called amlodipine. You are to take this medication once daily. Please return to clinic in 1 month to check on your blood pressure and have lab work done.

## 2017-03-08 NOTE — Assessment & Plan Note (Signed)
Patient has chronic hip pain secondary to spastic cerebral palsy. This is well controlled on tramadol. She takes this as a when necessary medication when her pain is severe. She has tried cyclobenzaprine and baclofen in the past without relief and says they were over sedating. She is not interested in any type of injection at this time. She is not a surgical candidate as of yet. We will refill tramadol. -- Refill tramadol

## 2017-03-08 NOTE — Assessment & Plan Note (Addendum)
The patient has a history of prediabetes. Today she endorses nocturia. Denies dysuria. We'll repeat hemoglobin A1c. Repeat hemoglobin A1c today of 5.6. This is still within the prediabetes category. The patient states that she's been drinking lots of coffee during the day which most likely explains her nocturia. We'll continue to monitor as medical therapy is not indicated at this time. -- Continue to monitor

## 2017-03-08 NOTE — Assessment & Plan Note (Addendum)
Patient has a history of coronary artery disease and hypertension. She is currently taking metoprolol extended release 12.5 mg once daily. Blood pressure in the office today as follows: Vitals:   03/08/17 1356  BP: (!) 157/75  Pulse: (!) 57  Temp: 98.2 F (36.8 C)   Patient remains hypertensive. We'll start low-dose amlodipine at this time. She needs to follow-up in clinic in 1 month for repeat blood pressure and lab work. -- Amlodipine 5 mg once daily -- Continue metoprolol 12.5 mg daily -- Return to clinic in 1 month for repeat blood pressure assessment, basic metabolic panel and lipid profile

## 2017-03-08 NOTE — Assessment & Plan Note (Signed)
Patient diagnosed with cerebral palsy. She is requesting a handicap parking permit today I think this is entirely reasonable. This will be provided for her. -- Provided with handicap parking permit

## 2017-03-08 NOTE — Progress Notes (Signed)
    CC: Hip pain and polyuria HPI: [name] is a 51 y.o. female with a past medical history as listed below who presents with hip pain and polyuria.   Past Medical History:  Diagnosis Date  . Bipolar disorder (HCC)     Followed by mental health, who prescribe her bipolar meds  . Cerebral palsy (HCC)     with history of neuropathic pain  and spasticity,  requiring long-term pain medications  . Coronary artery disease 02/2008   a. s/p INF STEMI 5/09 tx with BMS to CFX;  b. Lexiscan Myoview 12/10 -  low risk, no ischemia.;   c. s/p NSTEMI 8/12: cath with LM 10%, LAD lum. irregs., oOM1 40%, mOM2 40%, mCFX stent occluded, RCA 50%, EF 45-50%; PCI - s/p DES to mCFX  d. Patent LCx stent, diffuse disease up to 50% in small nondominant RCA, 80% ostial stenosis in small OM3.   Marland Kitchen. GERD (gastroesophageal reflux disease)   . History of pelvic mass    Found on CT abd/ pelvis (04/2008) - 13.3 cm cystic pelvis mass, possibly left ovarian origin. [facility].   Marland Kitchen. HLD (hyperlipidemia)   . Hypertension   . Vitamin D deficiency 11/2009    patient treated with high-dose vitamin D weekly x3 months,  levels were rechecked and it was determined that the patient continued to require high dose repletion of vitamin D.    Review of Systems: Denies chest pain and shortness of breath. Endorses polyuria. Denies constipation and diarrhea.   Physical Exam: Vitals:   03/08/17 1356  BP: (!) 157/75  Pulse: (!) 57  Temp: 98.2 F (36.8 C)  TempSrc: Oral  Weight: 158 lb 14.4 oz (72.1 kg)  Height: 5\' 4"  (1.626 m)   BP (!) 157/75 (BP Location: Left Arm, Patient Position: Sitting, Cuff Size: Normal)   Pulse (!) 57   Temp 98.2 F (36.8 C) (Oral)   Ht 5\' 4"  (1.626 m)   Wt 158 lb 14.4 oz (72.1 kg)   LMP 12/22/2007   BMI 27.28 kg/m  General appearance: alert and cooperative Head: Normocephalic, without obvious abnormality, atraumatic Lungs: clear to auscultation bilaterally Heart: regular rate  and rhythm, S1, S2 normal, no murmur, click, rub or gallop Abdomen: soft, non-tender; bowel sounds normal; no masses,  no organomegaly Extremities: extremities normal, atraumatic, no cyanosis or edema  Assessment & Plan:  See encounters tab for problem based medical decision making. Patient discussed with Dr. Cleda DaubE. Hoffman  Signed: Thomasene Lotaylor, Fong Mccarry, MD 03/08/2017, 2:10 PM  Pager: [phone]

## 2017-03-08 NOTE — Assessment & Plan Note (Signed)
Patient will benefit from basic metabolic panel and lipid profile at her next visit when she returns for hypertension management. She is currently on a statin and has a history of coronary artery disease. She is overdue for colon cancer screening. She does not wish to have colonoscopy but after discussing various methods had decided to do DNA stool testing. This will be provided for her today. -- Colon cancer screening provided

## 2017-03-12 NOTE — Progress Notes (Signed)
Internal Medicine Clinic Attending  Case discussed with Dr. Taylor at the time of the visit.  We reviewed the resident's history and exam and pertinent patient test results.  I agree with the assessment, diagnosis, and plan of care documented in the resident's note. 

## 2017-03-12 NOTE — Addendum Note (Signed)
Addended by: Carlynn PurlHOFFMAN, Shaqueta Casady C on: 03/12/2017 01:22 PM   Modules accepted: Level of Service

## 2017-03-19 ENCOUNTER — Encounter: Payer: Self-pay | Admitting: *Deleted

## 2017-05-14 IMAGING — CR DG CHEST 2V
2 series · 2 of 2 positions shown · non-contrast
Comparison: Are 7 scoliosis upper thoracic spine convex towards the
left.

CLINICAL DATA: Hypertension. Burning sensation in chest. Cerebral
palsy.

EXAM:
CHEST  2 VIEW

[chest pa]
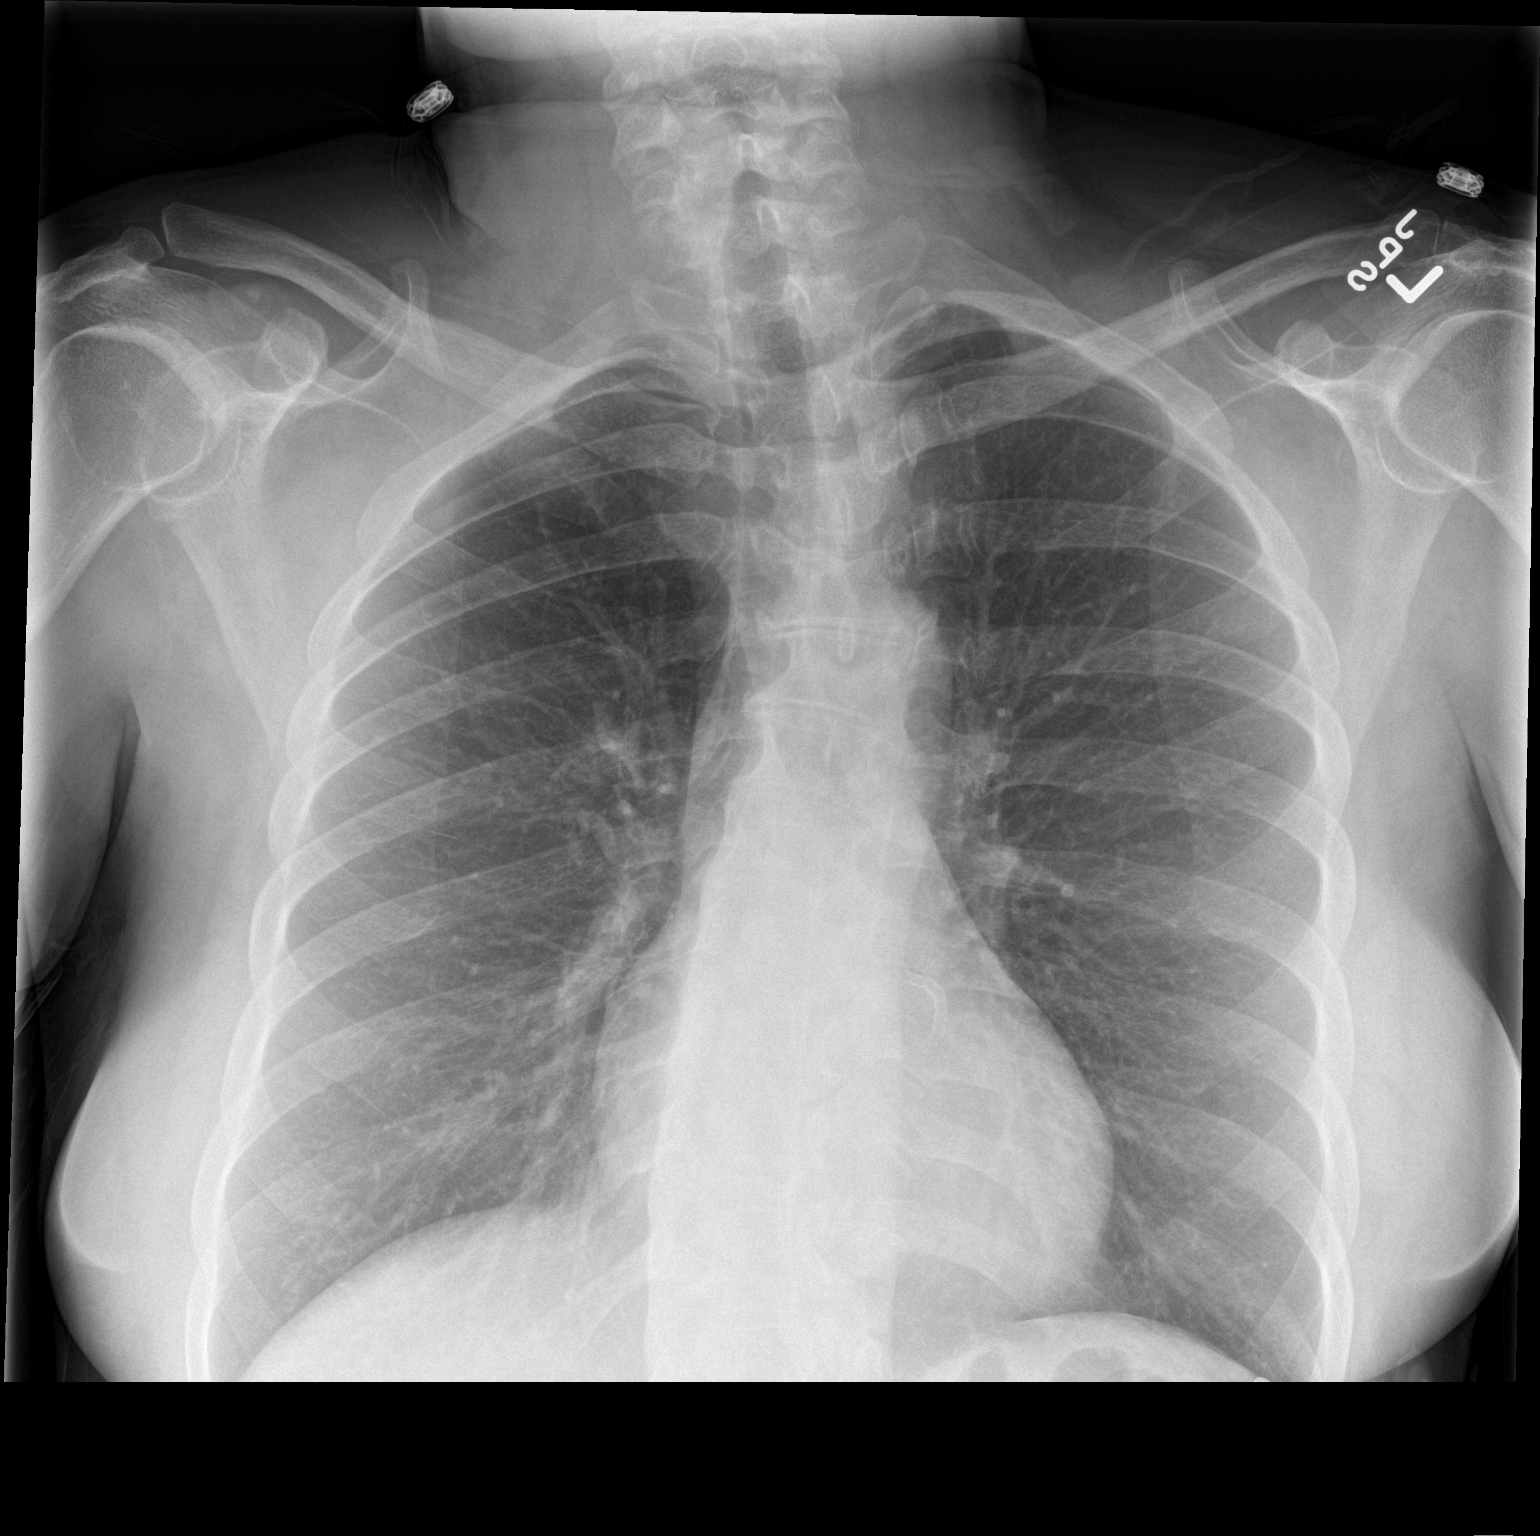

[chest lat]
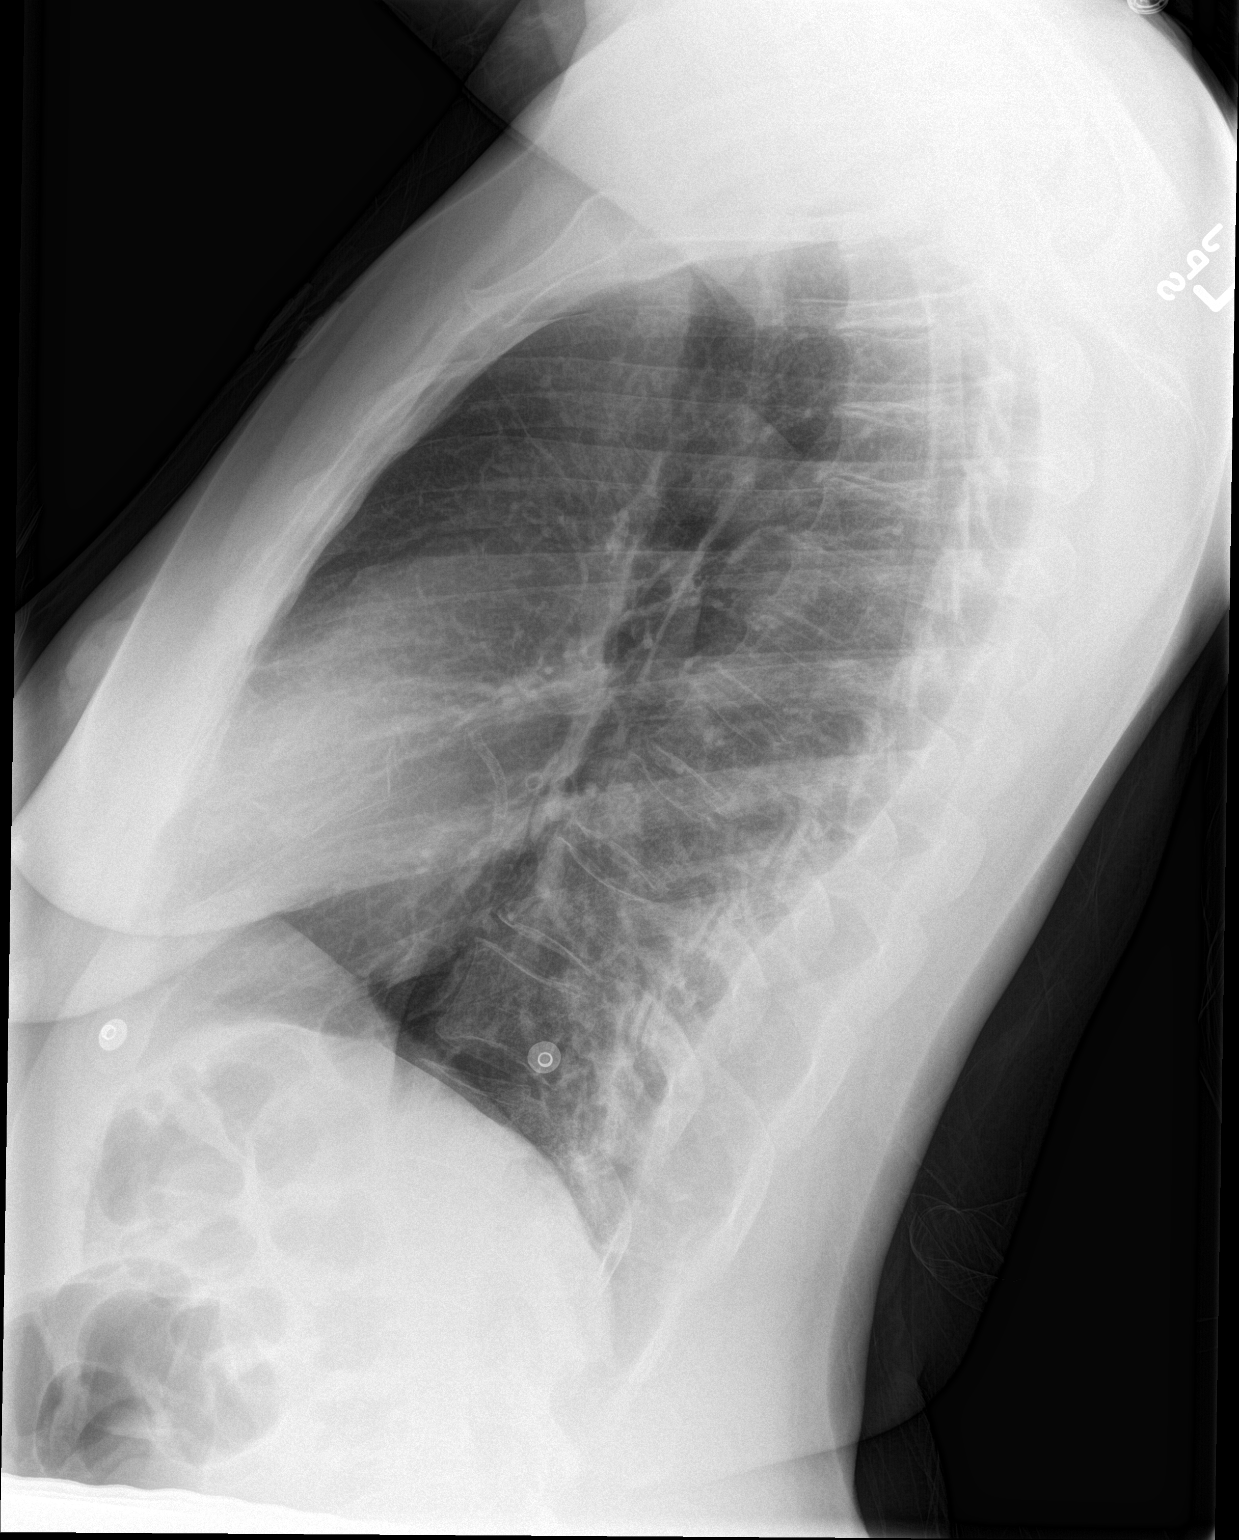

[2 of 2 positions shown; findings below may reference images not displayed]

Coronary artery vascular calcification or stent. Normal heart
size and pulmonary vascularity. No focal airspace disease or
consolidation in the lungs. No blunting of costophrenic angles. No
pneumothorax. Mediastinal contours appear intact.
FINDINGS: The heart size and mediastinal contours are within normal limits.
Both lungs are clear. The visualized skeletal structures are
unremarkable.
IMPRESSION: No active cardiopulmonary disease.

## 2017-05-17 ENCOUNTER — Ambulatory Visit (INDEPENDENT_AMBULATORY_CARE_PROVIDER_SITE_OTHER): Payer: Medicare Other | Admitting: Internal Medicine

## 2017-05-17 VITALS — BP 147/79 | HR 52 | Temp 98.0°F | Ht 64.0 in | Wt 162.1 lb

## 2017-05-17 DIAGNOSIS — I2510 Atherosclerotic heart disease of native coronary artery without angina pectoris: Secondary | ICD-10-CM | POA: Diagnosis not present

## 2017-05-17 DIAGNOSIS — M25551 Pain in right hip: Secondary | ICD-10-CM

## 2017-05-17 DIAGNOSIS — G809 Cerebral palsy, unspecified: Secondary | ICD-10-CM | POA: Diagnosis not present

## 2017-05-17 DIAGNOSIS — Z993 Dependence on wheelchair: Secondary | ICD-10-CM | POA: Diagnosis not present

## 2017-05-17 DIAGNOSIS — G4700 Insomnia, unspecified: Secondary | ICD-10-CM

## 2017-05-17 DIAGNOSIS — Z79891 Long term (current) use of opiate analgesic: Secondary | ICD-10-CM | POA: Diagnosis not present

## 2017-05-17 DIAGNOSIS — M25552 Pain in left hip: Secondary | ICD-10-CM | POA: Diagnosis not present

## 2017-05-17 DIAGNOSIS — I10 Essential (primary) hypertension: Secondary | ICD-10-CM | POA: Diagnosis not present

## 2017-05-17 DIAGNOSIS — Z79899 Other long term (current) drug therapy: Secondary | ICD-10-CM

## 2017-05-17 MED ORDER — METOPROLOL SUCCINATE ER 25 MG PO TB24: 12.5000 mg | ORAL_TABLET | Freq: Every day | ORAL | 2 refills | Status: DC

## 2017-05-17 MED ORDER — TRAMADOL HCL 50 MG PO TABS: 50.0000 mg | ORAL_TABLET | Freq: Every day | ORAL | 0 refills | Status: DC | PRN

## 2017-05-17 MED ORDER — HYDROCHLOROTHIAZIDE 12.5 MG PO CAPS: 12.5000 mg | ORAL_CAPSULE | Freq: Every day | ORAL | 11 refills | Status: DC

## 2017-05-17 MED ORDER — ZOLPIDEM TARTRATE 5 MG PO TABS: 5.0000 mg | ORAL_TABLET | Freq: Every evening | ORAL | 2 refills | Status: DC | PRN

## 2017-05-17 MED ORDER — ROSUVASTATIN CALCIUM 10 MG PO TABS: 10.0000 mg | ORAL_TABLET | Freq: Every day | ORAL | 11 refills | Status: DC

## 2017-05-17 MED ORDER — ROSUVASTATIN CALCIUM 20 MG PO TABS: 20.0000 mg | ORAL_TABLET | Freq: Every day | ORAL | 11 refills | Status: DC

## 2017-05-17 NOTE — Assessment & Plan Note (Addendum)
Patient has chronic hip pian secondary to spastic cerebral palsy. She has tried cyclobenzaprine and baclofen in the past without relief and felt they were too sedating. This pain has been well controlled on tramadol. Her last pain contract was in 2015, so we filled out a new pain contract today. She has been needing 2 doses on some days so she was increased to 35 pills per 30 day period. She was supplied three 30 day prescription with pharmacy instruction to fill 30 days following the previous filled date. - Tramadol 50mg  Daily PRN

## 2017-05-17 NOTE — Assessment & Plan Note (Addendum)
Patient has a history of insomnia. She continues to have some trouble sleeping. She was last prescribed ambien in 2017. She states she does not take Ambien regularly and when she takes tramadol in the evening she does not need the Ambien. She ran out of her prescription about 3 weeks ago. - Refill Ambien 5mg  qhs

## 2017-05-17 NOTE — Assessment & Plan Note (Addendum)
Patient has a history of Hypertension and Coronary artery disease. She is on metoprolol succinate 12.5mg  daily and was started on amlodipine 5mg  daily at her visit on 03/08/17 for persistent hypertension. Today her blood pressure is 147/79 and she states that she stopped taking her Amlodipine after a few day because she started getting bad headaches. She agreed to a trial of hydrochlorothiazide 12.5mg  daily. - Refill metoprolol 12.5mg  daily - Hydrochlorothiazide 12.5 mg daily - Reassess in 1 month - BMP in 1 month

## 2017-05-17 NOTE — Patient Instructions (Signed)
Thank you for allowing us to care for you  For you high blood pressure: - We ADDED hydrochlorothiazide 12.5 mg daily - please return in 1 month to evaluate your response to this medication  For your history of coronary artery disease (history of heart attack): - We increased your rosuvastatin to 20mg  daily - please return in 1 month to evaluate your response to this medication - We refilled your metoprolol  For your hip pain: - We filled out a new pain contract, a copy is attached - We refilled your tramadol 50 mg 1-2 daily PRN, 35 pills per 30 days  For your Insomnia: - We refilled your Ambien 5 mg

## 2017-05-17 NOTE — Assessment & Plan Note (Signed)
Patient has a history of cerebral palsy. Today she continue to have hip pain associated with her cerebral palsy. Her pain medication was refilled for this sequela. Please see separate hip pain assessment for details. - Refill Tramadol for hip pain associated with cerebral palsy

## 2017-05-17 NOTE — Progress Notes (Signed)
   CC: Hypertension management and lab work  HPI:  [name] is a 51 y.o. female with a history as below. Please see the Assessment and Plan for the status of the patients chronic medical problems.  Past Medical History:  Diagnosis Date  . Bipolar disorder (HCC)     Followed by mental health, who prescribe her bipolar meds  . Cerebral palsy (HCC)     with history of neuropathic pain  and spasticity,  requiring long-term pain medications  . Coronary artery disease 02/2008   a. s/p INF STEMI 5/09 tx with BMS to CFX;  b. Lexiscan Myoview 12/10 -  low risk, no ischemia.;   c. s/p NSTEMI 8/12: cath with LM 10%, LAD lum. irregs., oOM1 40%, mOM2 40%, mCFX stent occluded, RCA 50%, EF 45-50%; PCI - s/p DES to mCFX  d. Patent LCx stent, diffuse disease up to 50% in small nondominant RCA, 80% ostial stenosis in small OM3.   Marland Kitchen. GERD (gastroesophageal reflux disease)   . History of pelvic mass    Found on CT abd/ pelvis (04/2008) - 13.3 cm cystic pelvis mass, possibly left ovarian origin. [facility]   Marland Kitchen. HLD (hyperlipidemia)   . Hypertension   . Vitamin D deficiency 11/2009    patient treated with high-dose vitamin D weekly x3 months,  levels were rechecked and it was determined that the patient continued to require high dose repletion of vitamin D.   Review of Systems:  ROS performed and negative except as described.  Physical Exam:  Vitals:   05/17/17 1337  BP: (!) 147/79  Pulse: (!) 52  Temp: 98 F (36.7 C)  TempSrc: Oral  SpO2: 100%  Weight: 162 lb 1.6 oz (73.5 kg)  Height: 5\' 4"  (1.626 m)   Physical Exam  Constitutional: She is oriented to person, place, and time. She appears well-developed and well-nourished.  Wheel Chair bound female  HENT:  Head: Normocephalic and atraumatic.  Eyes: Conjunctivae and EOM are normal.  Cardiovascular: Normal rate, regular rhythm and normal heart sounds.   Pulmonary/Chest: Effort normal and breath sounds normal. She has no  wheezes. She has no rales.  Abdominal: Soft. Bowel sounds are normal. She exhibits no distension. There is no tenderness.  Musculoskeletal: She exhibits edema and tenderness.  Neurological: She is alert and oriented to person, place, and time.  Skin: Skin is warm and dry.     Assessment & Plan:   See Encounters Tab for problem based charting.  Patient seen with Dr. Heide SparkNarendra.

## 2017-05-17 NOTE — Assessment & Plan Note (Addendum)
Patient has a history of coronary artery disease. She is currently taking aspirin 81mg  daily and rosuvastatin 10mg , and metoprolol succinate 12.5 daily. She has a history of intolerance to atorvastatin. Patient has tolerated rosuvastatin well, but states she ran out 1 month ago. We will increase her rosuvastatin today to 20mg  as this is the optimal dose for CAD. Lipid profile deferred to follow up visit. - Refill Metoprolol 12.5mg  Daily - Continue aspirin 81mg  daily - Rosuvastatin increased to 20mg  Daily - Lipid profile on follow up visit

## 2017-05-18 NOTE — Progress Notes (Signed)
Internal Medicine Clinic Attending  Case discussed with Dr. Melvin  at the time of the visit.  We reviewed the resident's history and exam and pertinent patient test results.  I agree with the assessment, diagnosis, and plan of care documented in the resident's note.  

## 2017-05-22 LAB — TOXASSURE SELECT,+ANTIDEPR,UR

## 2017-07-16 NOTE — Progress Notes (Signed)
   CC: Hypertension, Coronary Atherosclerosis, Chronic Hip Pain  HPI:  [name] is a 50 y.o. F with PMHx listed below presenting for Hypertension, Coronary A41therosclerosis, and Chronic hip pain. Please see the A&P for the status of the patient's chronic medical problems.    Past Medical History:  Diagnosis Date  . Bipolar disorder (HCC)     Followed by mental health, who prescribe her bipolar meds  . Cerebral palsy (HCC)     with history of neuropathic pain  and spasticity,  requiring long-term pain medications  . Coronary artery disease 02/2008   a. s/p INF STEMI 5/09 tx with BMS to CFX;  b. Lexiscan Myoview 12/10 -  low risk, no ischemia.;   c. s/p NSTEMI 8/12: cath with LM 10%, LAD lum. irregs., oOM1 40%, mOM2 40%, mCFX stent occluded, RCA 50%, EF 45-50%; PCI - s/p DES to mCFX  d. Patent LCx stent, diffuse disease up to 50% in small nondominant RCA, 80% ostial stenosis in small OM3.   Marland Kitchen GERD (gastroesophageal reflux disease)   . History of pelvic mass    Found on CT abd/ pelvis (04/2008) - 13.3 cm cystic pelvis mass, possibly left ovarian origin. To be [facility] at [facility].   Marland Kitchen HLD (hyperlipidemia)   . Hypertension   . Vitamin D deficiency 11/2009    patient treated with high-dose vitamin D weekly x3 months,  levels were rechecked and it was determined that the patient continued to require high dose repletion of vitamin D.   Review of Systems:  Performed and negative except as otherwise indicated.  Physical Exam:  Vitals:   07/17/17 1329  BP: 137/66  Pulse: (!) 55  Temp: 98.6 F (37 C)  TempSrc: Oral  SpO2: 100%  Weight: 165 lb 9.6 oz (75.1 kg)   Physical Exam  Constitutional: She appears well-developed and well-nourished.  Wheel chair bound female  HENT:  Head: Normocephalic and atraumatic.  Eyes: EOM are normal. Right eye exhibits no discharge. Left eye exhibits no discharge.  Cardiovascular: Normal rate, regular rhythm, normal heart sounds and intact distal  pulses.   Pulmonary/Chest: Effort normal and breath sounds normal. She has no wheezes.  Abdominal: Soft. Bowel sounds are normal. She exhibits no distension. There is no tenderness.  Musculoskeletal: She exhibits no edema or deformity.  Skin: Skin is warm and dry.  Psychiatric: She has a normal mood and affect.   Assessment & Plan:   See Encounters Tab for problem based charting.  Patient seen with Dr. Oswaldo Done

## 2017-07-17 ENCOUNTER — Encounter: Payer: Self-pay | Admitting: Internal Medicine

## 2017-07-17 ENCOUNTER — Encounter (INDEPENDENT_AMBULATORY_CARE_PROVIDER_SITE_OTHER): Payer: Self-pay

## 2017-07-17 ENCOUNTER — Ambulatory Visit (INDEPENDENT_AMBULATORY_CARE_PROVIDER_SITE_OTHER): Payer: Medicare Other | Admitting: Internal Medicine

## 2017-07-17 VITALS — BP 137/66 | HR 55 | Temp 98.6°F | Wt 165.6 lb

## 2017-07-17 DIAGNOSIS — Z79899 Other long term (current) drug therapy: Secondary | ICD-10-CM | POA: Diagnosis not present

## 2017-07-17 DIAGNOSIS — I10 Essential (primary) hypertension: Secondary | ICD-10-CM

## 2017-07-17 DIAGNOSIS — M25551 Pain in right hip: Secondary | ICD-10-CM

## 2017-07-17 DIAGNOSIS — M25552 Pain in left hip: Secondary | ICD-10-CM

## 2017-07-17 DIAGNOSIS — G809 Cerebral palsy, unspecified: Secondary | ICD-10-CM

## 2017-07-17 DIAGNOSIS — G8929 Other chronic pain: Secondary | ICD-10-CM | POA: Diagnosis not present

## 2017-07-17 DIAGNOSIS — I252 Old myocardial infarction: Secondary | ICD-10-CM

## 2017-07-17 DIAGNOSIS — E785 Hyperlipidemia, unspecified: Secondary | ICD-10-CM | POA: Diagnosis not present

## 2017-07-17 DIAGNOSIS — I2510 Atherosclerotic heart disease of native coronary artery without angina pectoris: Secondary | ICD-10-CM | POA: Diagnosis not present

## 2017-07-17 MED ORDER — TRAMADOL HCL 50 MG PO TABS: 50.0000 mg | ORAL_TABLET | Freq: Every day | ORAL | 0 refills | Status: DC | PRN

## 2017-07-17 NOTE — Assessment & Plan Note (Addendum)
Patient has a history of coronary artery disease. She is currently taking aspirin  daily and rosuvastatin , and metoprolol succinate 12.5 daily. She has a history of intolerance to atorvastatin. Patient has tolerated rosuvastatin well, but she had run out last at her last visit. Her rosuvastatin was increased  at her last visit as this is the optimal dose for CAD. - Continue Metoprolol 12.5mg  Daily - Continue aspirin  daily - Continue Rosuvastatin increased to  Daily - Lipid profile today

## 2017-07-17 NOTE — Assessment & Plan Note (Addendum)
Patient has chronic hip pian secondary to spastic cerebral palsy. Pain remains well controlled on tramadol. She had been needing 2 doses on some days so she was increased to 35 pills per 30 day period during her last visit. She has one refill remaining at the time of this visit. - Supplied with 2 additional Rx's so she can go 3 months before follow up. (each Rx with instruction to fill 30 days from prior) - Tramadol  Daily PRN

## 2017-07-17 NOTE — Patient Instructions (Signed)
Thank you for allowing [place] to care for you  Your blood pressure today was 137/66 which is improving! - Continue to take Hydrochlorothiazide (fluid pill) in the morning, daily  You are having labs drawn today to look at your metabolic function, and Lipids - You will be called with abnormal results - You will receive a letter for normal results  You have been provided with 2 prescription for tramadol, this should give you a total supply of 3 months  Please consider getting a flu shot soon  Please return in 3 months

## 2017-07-17 NOTE — Assessment & Plan Note (Signed)
Patient has a history of cerebral palsy. She continues to have hip pain associated with her cerebral palsy. Her pain medication was refilled for this sequela. Please see separate hip pain assessment for details. - Refill Tramadol for hip pain associated with cerebral palsy 

## 2017-07-17 NOTE — Assessment & Plan Note (Addendum)
Patient has a history of Hypertension and Coronary artery disease. She is on metoprolol succinate 12.5mg  daily and was previously tried on amlodipine  daily, but experienced persistent headaches and stopped taking this. She was subsequently started on HCTZ 12.5 Daily and has returned for a recheck. Today her blood pressure is 137/66 (improved from 147/79 last visit) She states she has only been taking the HCTZ every other day as she was taking it at night and having to get up more often at night to use the restroom. She has been instructed that she can and should take this medication in the morning. - Continue Hydrochlorothiazide 12.5 mg daily - BMP today

## 2017-07-18 LAB — BMP8+ANION GAP
Anion Gap: 14 mmol/L (ref 10.0–18.0)
BUN / CREAT RATIO: 13 (ref 9–23)
BUN: 8 mg/dL (ref 6–24)
CHLORIDE: 105 mmol/L (ref 96–106)
CO2: 22 mmol/L (ref 20–29)
CREATININE: 0.63 mg/dL (ref 0.57–1.00)
Calcium: 9.8 mg/dL (ref 8.7–10.2)
GFR calc non Af Amer: 103 mL/min/{1.73_m2} (ref >59)
GFR, EST AFRICAN AMERICAN: 119 mL/min/{1.73_m2} (ref >59)
GLUCOSE: 76 mg/dL (ref 65–99)
Potassium: 3.9 mmol/L (ref 3.5–5.2)
SODIUM: 141 mmol/L (ref 134–144)

## 2017-07-18 LAB — LIPID PANEL
CHOLESTEROL TOTAL: 156 mg/dL (ref 100–199)
Chol/HDL Ratio: 3.3 ratio (ref 0.0–4.4)
HDL: 48 mg/dL (ref >39)
LDL Calculated: 90 mg/dL (ref 0–99)
Triglycerides: 88 mg/dL (ref 0–149)
VLDL CHOLESTEROL CAL: 18 mg/dL (ref 5–40)

## 2017-07-18 NOTE — Progress Notes (Signed)
Internal Medicine Clinic Attending  I saw and evaluated the patient.  I personally confirmed the key portions of the history and exam documented by Dr. Melvin and I reviewed pertinent patient test results.  The assessment, diagnosis, and plan were formulated together and I agree with the documentation in the resident's note.  

## 2017-07-18 NOTE — Addendum Note (Signed)
Addended by: Erlinda Hong T on: 07/18/2017 09:20 AM   Modules accepted: Level of Service

## 2017-08-16 ENCOUNTER — Encounter: Payer: Self-pay | Admitting: Internal Medicine

## 2017-10-18 ENCOUNTER — Encounter: Payer: Self-pay | Admitting: Internal Medicine

## 2017-10-30 ENCOUNTER — Encounter: Payer: Self-pay | Admitting: *Deleted

## 2017-12-06 ENCOUNTER — Encounter: Payer: Self-pay | Admitting: Internal Medicine

## 2017-12-26 ENCOUNTER — Encounter: Payer: Self-pay | Admitting: Internal Medicine

## 2017-12-26 ENCOUNTER — Ambulatory Visit (INDEPENDENT_AMBULATORY_CARE_PROVIDER_SITE_OTHER): Payer: Medicare Other | Admitting: Internal Medicine

## 2017-12-26 ENCOUNTER — Encounter (INDEPENDENT_AMBULATORY_CARE_PROVIDER_SITE_OTHER): Payer: Self-pay

## 2017-12-26 ENCOUNTER — Other Ambulatory Visit: Payer: Self-pay

## 2017-12-26 VITALS — BP 151/83 | HR 53 | Temp 98.2°F | Ht 64.0 in | Wt 166.1 lb

## 2017-12-26 DIAGNOSIS — M25559 Pain in unspecified hip: Secondary | ICD-10-CM | POA: Diagnosis not present

## 2017-12-26 DIAGNOSIS — G8929 Other chronic pain: Secondary | ICD-10-CM | POA: Diagnosis not present

## 2017-12-26 DIAGNOSIS — G479 Sleep disorder, unspecified: Secondary | ICD-10-CM | POA: Diagnosis not present

## 2017-12-26 DIAGNOSIS — Z7982 Long term (current) use of aspirin: Secondary | ICD-10-CM

## 2017-12-26 DIAGNOSIS — K219 Gastro-esophageal reflux disease without esophagitis: Secondary | ICD-10-CM | POA: Diagnosis not present

## 2017-12-26 DIAGNOSIS — Z79899 Other long term (current) drug therapy: Secondary | ICD-10-CM | POA: Diagnosis not present

## 2017-12-26 DIAGNOSIS — L853 Xerosis cutis: Secondary | ICD-10-CM

## 2017-12-26 DIAGNOSIS — I10 Essential (primary) hypertension: Secondary | ICD-10-CM

## 2017-12-26 DIAGNOSIS — G801 Spastic diplegic cerebral palsy: Secondary | ICD-10-CM

## 2017-12-26 DIAGNOSIS — G4733 Obstructive sleep apnea (adult) (pediatric): Secondary | ICD-10-CM | POA: Insufficient documentation

## 2017-12-26 DIAGNOSIS — M25552 Pain in left hip: Secondary | ICD-10-CM

## 2017-12-26 DIAGNOSIS — Z79891 Long term (current) use of opiate analgesic: Secondary | ICD-10-CM

## 2017-12-26 DIAGNOSIS — M25551 Pain in right hip: Secondary | ICD-10-CM

## 2017-12-26 DIAGNOSIS — I2510 Atherosclerotic heart disease of native coronary artery without angina pectoris: Secondary | ICD-10-CM

## 2017-12-26 MED ORDER — METOPROLOL SUCCINATE ER 25 MG PO TB24: 12.5000 mg | ORAL_TABLET | Freq: Every day | ORAL | 2 refills | Status: DC

## 2017-12-26 MED ORDER — HYDROCORTISONE 2.5 % EX LOTN: TOPICAL_LOTION | Freq: Two times a day (BID) | CUTANEOUS | 0 refills | Status: DC

## 2017-12-26 MED ORDER — NITROGLYCERIN 0.4 MG SL SUBL: 0.4000 mg | SUBLINGUAL_TABLET | SUBLINGUAL | 0 refills | Status: DC | PRN

## 2017-12-26 MED ORDER — PANTOPRAZOLE SODIUM 40 MG PO TBEC: 40.0000 mg | DELAYED_RELEASE_TABLET | Freq: Every day | ORAL | 3 refills | Status: DC

## 2017-12-26 NOTE — Patient Instructions (Addendum)
Thank you for allowing us to care for you  For your high blood pressure: - Your BP was elevated today at 151/88 - We have provided refills for your BP medications - We will defer medication adjustments pending the results of your sleep study we have ordered  For your chronic hip pain: - Continue to take 1-2 Tramadol daily as needed - Continue to take tylenol daily (less than 3,000 mg daily is safe)  For your difficulty sleeping: - A sleep study has been ordered for you  For your acid reflux - Pantoprazole has been refilled for you  We will discuss colonoscopy referral at your next visit  Please follow up in about 3 months (after sleep study) for reevaluation.

## 2017-12-26 NOTE — Assessment & Plan Note (Signed)
BP elevated today at 151/88. This is increased from previous BP of 137/66. Patient states she has been taking her medications. She is requesting refill of metoprolol today. She also reports difficultly sleeping (as noted elsewhere) and is being sent for sleep study to evaluate for OSA, which is also likely contributing to her elevated BP. Will defer medication adjustments at this time until the result of her sleep study are available, considering her BP was well controlled at prior visit. - HCTZ 12.5mg  Daily - Metoprolol 12.5mg  Daily

## 2017-12-26 NOTE — Progress Notes (Signed)
   CC: Hypertension, Hip pain, Sleep Disturbance, Medication refills  HPI:  [name] is a 52 y.o. F with PMHx listed below presenting for Hypertension, Hip pain, Sleep Disturbance, Medication refills. Please see the A&P for the status of the patient's chronic medical problems.  Past Medical History:  Diagnosis Date  . Bipolar disorder (HCC)     Followed by mental health, who prescribe her bipolar meds  . Cerebral palsy (HCC)     with history of neuropathic pain  and spasticity,  requiring long-term pain medications  . Coronary artery disease 02/2008   a. s/p INF STEMI 5/09 tx with BMS to CFX;  b. Lexiscan Myoview 12/10 -  low risk, no ischemia.;   c. s/p NSTEMI 8/12: cath with LM 10%, LAD lum. irregs., oOM1 40%, mOM2 40%, mCFX stent occluded, RCA 50%, EF 45-50%; PCI - s/p DES to mCFX  d. Patent LCx stent, diffuse disease up to 50% in small nondominant RCA, 80% ostial stenosis in small OM3.   Marland Kitchen. GERD (gastroesophageal reflux disease)   . History of pelvic mass    Found on CT abd/ pelvis (04/2008) - 13.3 cm cystic pelvis mass, possibly left ovarian origin. [facility].   Marland Kitchen. HLD (hyperlipidemia)   . Hypertension   . Vitamin D deficiency 11/2009    patient treated with high-dose vitamin D weekly x3 months,  levels were rechecked and it was determined that the patient continued to require high dose repletion of vitamin D.   Review of Systems:  Performed and all others negative.  Physical Exam:  Vitals:   12/26/17 1525  BP: (!) 151/88  Pulse: (!) 56  Temp: 98.2 F (36.8 C)  TempSrc: Oral  SpO2: 100%  Weight: 166 lb 1.6 oz (75.3 kg)  Height: 5\' 4"  (1.626 m)   Physical Exam  Constitutional: She is oriented to person, place, and time. She appears well-developed and well-nourished. No distress.  HENT:  Head: Normocephalic and atraumatic.  Cardiovascular: Regular rhythm, normal heart sounds and intact distal pulses.  Mild bradycardia  Pulmonary/Chest: Effort  normal and breath sounds normal. No respiratory distress.  Abdominal: Soft. Bowel sounds are normal. She exhibits no distension. There is no tenderness.  Musculoskeletal: She exhibits no edema or deformity.  Neurological: She is alert and oriented to person, place, and time.  Skin: Skin is warm and dry.   Assessment & Plan:   See Encounters Tab for problem based charting.  Patient discussed with Dr. Heide SparkNarendra

## 2017-12-26 NOTE — Assessment & Plan Note (Addendum)
Patient has chronic hip pian secondary to spastic cerebral palsy. Painremains adequately controlled on tylenol and tramadol (1-2 tablets daily PRN). She state she has not used all of her refills at this time. Database reviewed and appropriate. - Tylenol (<3g daily) - Tramadol 50mg  Daily PRN #35 (Each Rx with instruction to fill 30 days from prior)

## 2017-12-26 NOTE — Assessment & Plan Note (Signed)
Patient has a history of CAD. She is requesting refills of metoprolol and nitroglycerin today. Will provide refills ans continue current therapy. - Metoprolol 12.5mg  Daily - ASA 81mg  Daily - Rosuvastatin 20mg  Daily - Nitrostat 0.4mg  PRN

## 2017-12-26 NOTE — Assessment & Plan Note (Signed)
Patient reports history of difficulty sleeping. She states that she feels tired during the day. She wakes up at night feeling like she needs to catch her breath and coughing. She has also been told by family and significant others that she stops breathing in her sleep. She additionally has a history of HTN. Her history is consistent with Obstructive sleep apnea. Will send patient for sleep study. - Split night sleep study

## 2017-12-26 NOTE — Assessment & Plan Note (Signed)
Patient is requesting a refill of her hydrocortisone cream today, which was provided. - Hydrocortisone cream 2.5%, to affected area

## 2017-12-27 ENCOUNTER — Encounter: Payer: Self-pay | Admitting: Internal Medicine

## 2017-12-27 NOTE — Progress Notes (Signed)
Internal Medicine Clinic Attending  Case discussed with Dr. Melvin  at the time of the visit.  We reviewed the resident's history and exam and pertinent patient test results.  I agree with the assessment, diagnosis, and plan of care documented in the resident's note.  

## 2018-02-04 ENCOUNTER — Encounter (HOSPITAL_BASED_OUTPATIENT_CLINIC_OR_DEPARTMENT_OTHER): Payer: Self-pay

## 2018-02-08 ENCOUNTER — Other Ambulatory Visit: Payer: Self-pay

## 2018-02-08 ENCOUNTER — Other Ambulatory Visit: Payer: Self-pay | Admitting: *Deleted

## 2018-02-08 DIAGNOSIS — G809 Cerebral palsy, unspecified: Secondary | ICD-10-CM

## 2018-02-08 NOTE — Telephone Encounter (Signed)
traMADol (ULTRAM) 50 MG tablet   Refill request @ [facility] on [address].

## 2018-02-08 NOTE — Telephone Encounter (Signed)
Request sent 

## 2018-02-09 ENCOUNTER — Other Ambulatory Visit: Payer: Self-pay | Admitting: Internal Medicine

## 2018-02-09 DIAGNOSIS — G809 Cerebral palsy, unspecified: Secondary | ICD-10-CM

## 2018-02-09 DIAGNOSIS — M25552 Pain in left hip: Secondary | ICD-10-CM

## 2018-02-09 MED ORDER — TRAMADOL HCL 50 MG PO TABS: 50.0000 mg | ORAL_TABLET | Freq: Every day | ORAL | 0 refills | Status: DC | PRN

## 2018-02-09 NOTE — Progress Notes (Signed)
Patient asked for refill of medication and refill was printed by mistake. This is an electronic refill of this medication. Patient takes this pain medication as needed for hip pain secondary to spastic cerebral palsy.  Ginette Otto, DO IM PGY-1

## 2018-02-09 NOTE — Telephone Encounter (Signed)
Refill Approved

## 2018-02-11 NOTE — Telephone Encounter (Signed)
Call made to pharmacy to confirm-pt has picked up rx. No further action required, phone call complete.Criss Alvine, Daschel Roughton Cassady5/6/20199:31 AM

## 2018-02-26 ENCOUNTER — Ambulatory Visit (HOSPITAL_BASED_OUTPATIENT_CLINIC_OR_DEPARTMENT_OTHER): Payer: Medicare Other | Attending: Internal Medicine | Admitting: Internal Medicine

## 2018-02-26 DIAGNOSIS — G4733 Obstructive sleep apnea (adult) (pediatric): Secondary | ICD-10-CM | POA: Insufficient documentation

## 2018-02-26 DIAGNOSIS — G479 Sleep disorder, unspecified: Secondary | ICD-10-CM

## 2018-03-04 DIAGNOSIS — G479 Sleep disorder, unspecified: Secondary | ICD-10-CM | POA: Diagnosis not present

## 2018-03-04 DIAGNOSIS — G4733 Obstructive sleep apnea (adult) (pediatric): Secondary | ICD-10-CM

## 2018-03-04 NOTE — Procedures (Signed)
Patient Name: [name], [name] Date: 02/26/2018 Gender: Female D.O.B: 1966/09/18 Age (years): 51 Referring Provider: Burns Spain Height (inches): 64 Interpreting Physician: Jetty Duhamel MD, ABSM Weight (lbs): 167 RPSGT: Lowry Ram BMI: 29 MRN: [id number] Neck Size: 16.00  CLINICAL INFORMATION Sleep Study Type: NPSG Indication for sleep study: Hypertension, Snoring, Witnessed Apneas  Epworth Sleepiness Score: 9  SLEEP STUDY TECHNIQUE As per the AASM Manual for the Scoring of Sleep and Associated Events v2.3 (April 2016) with a hypopnea requiring 4% desaturations.  The channels recorded and monitored were frontal, central and occipital EEG, electrooculogram (EOG), submentalis EMG (chin), nasal and oral airflow, thoracic and abdominal wall motion, anterior tibialis EMG, snore microphone, electrocardiogram, and pulse oximetry.  MEDICATIONS Medications self-administered by patient taken the night of the study : none reported  SLEEP ARCHITECTURE The study was initiated at 10:31:31 PM and ended at 5:00:33 AM.  Sleep onset time was 28.9 minutes and the sleep efficiency was 70.6%%. The total sleep time was 274.5 minutes.  Stage REM latency was 106.5 minutes.  The patient spent 7.5%% of the night in stage N1 sleep, 80.0%% in stage N2 sleep, 0.0%% in stage N3 and 12.57% in REM.  Alpha intrusion was absent.  Supine sleep was 4.19%.  RESPIRATORY PARAMETERS The overall apnea/hypopnea index (AHI) was 5.9 per hour. There were 8 total apneas, including 5 obstructive, 1 central and 2 mixed apneas. There were 19 hypopneas and 15 RERAs.  The AHI during Stage REM sleep was 13.9 per hour.  AHI while supine was 20.9 per hour.  The mean oxygen saturation was 91.2%. The minimum SpO2 during sleep was 77.0%.  moderate snoring was noted during this study.  CARDIAC DATA The 2 lead EKG demonstrated sinus rhythm. The mean heart rate was 60.8 beats per minute. Other EKG  findings include: None.  LEG MOVEMENT DATA The total PLMS were 0 with a resulting PLMS index of 0.0. Associated arousal with leg movement index was 1.3 .  IMPRESSIONS - Mild obstructive sleep apnea occurred during this study (AHI = 5.9/h). - Insufficient events to meet protocol requirement for split CPAP titration. - No significant central sleep apnea occurred during this study (CAI = 0.2/h). - Oxygen desaturation was noted during this study (Min O2 = 77.0%). Mean 91.2%. Total sleep time wiith sat - The patient snored with moderate snoring volume. - No cardiac abnormalities were noted during this study. - Clinically significant periodic limb movements did not occur during sleep. No significant associated arousals.  DIAGNOSIS - Obstructive Sleep Apnea (327.23 [G47.33 ICD-10])  RECOMMENDATIONS - Treatment for very mild OSA is directed at symptoms. Observation, weight loss, positional sleep off back, or a chin strap might be sufficient. Other options, incuding CPAP or a fitted oral appliance, might be appropritate based on clinical judgment. - Positional therapy avoiding supine position during sleep. - Be careful with alcohol, sedatives and other CNS depressants that may worsen sleep apnea and disrupt normal sleep architecture. - Sleep hygiene should be reviewed to assess factors that may improve sleep quality. - Weight management and regular exercise should be initiated or continued if appropriate.  [Electronically signed] 03/04/2018 11:21 AM  Jetty Duhamel MD, ABSM Diplomate, American Board of Sleep Medicine   NPI: [id number]                          Jetty Duhamel Diplomate, American Board of Sleep Medicine  ELECTRONICALLY SIGNED ON:  03/04/2018, 11:15 AM [facility] PH: [phone]   FX: [phone] [place]

## 2018-03-28 ENCOUNTER — Ambulatory Visit: Payer: Self-pay

## 2018-05-29 NOTE — Progress Notes (Signed)
   CC: Hypertension, Hip Pain, Cerebral Palsy, OSA, Prediabetes, Preventative healthcare  HPI:  [name] is a 52 y.o. F with PMHx listed below presenting for Hypertension, Hip Pain, Cerebral Palsy, OSA, Prediabetes, Preventative healthcare. Please see the A&P for the status of the patient's chronic medical problems.  Past Medical History:  Diagnosis Date  . Bipolar disorder (HCC)     Followed by mental health, who prescribe her bipolar meds  . Cerebral palsy (HCC)     with history of neuropathic pain  and spasticity,  requiring long-term pain medications  . Coronary artery disease 02/2008   a. s/p INF STEMI 5/09 tx with BMS to CFX;  b. Lexiscan Myoview 12/10 -  low risk, no ischemia.;   c. s/p NSTEMI 8/12: cath with LM 10%, LAD lum. irregs., oOM1 40%, mOM2 40%, mCFX stent occluded, RCA 50%, EF 45-50%; PCI - s/p DES to mCFX  d. Patent LCx stent, diffuse disease up to 50% in small nondominant RCA, 80% ostial stenosis in small OM3.   Marland Kitchen. GERD (gastroesophageal reflux disease)   . History of pelvic mass    Found on CT abd/ pelvis (04/2008) - 13.3 cm cystic pelvis mass, possibly left ovarian origin. [facility]   Marland Kitchen. HLD (hyperlipidemia)   . Hypertension   . Vitamin D deficiency 11/2009    patient treated with high-dose vitamin D weekly x3 months,  levels were rechecked and it was determined that the patient continued to require high dose repletion of vitamin D.   Review of Systems:  Performed and all others negative.  Physical Exam:  Vitals:   05/30/18 1444  BP: 125/83  Pulse: (!) 56  Temp: 98.2 F (36.8 C)  TempSrc: Oral  SpO2: 100%  Weight: 158 lb 6.4 oz (71.8 kg)  Height: 5\' 4"  (1.626 m)   Physical Exam  Constitutional: She appears well-developed and well-nourished. No distress.  In wheelchair  Cardiovascular: Normal rate, regular rhythm, normal heart sounds and intact distal pulses.  Pulmonary/Chest: Effort normal and breath sounds normal. No respiratory  distress.  Abdominal: Soft. Bowel sounds are normal. She exhibits no distension. There is no tenderness.  Musculoskeletal: She exhibits no edema or deformity.  Skin: Skin is warm and dry.    Assessment & Plan:   See Encounters Tab for problem based charting.  Patient discussed with Dr. Sandre Kittyaines

## 2018-05-30 ENCOUNTER — Ambulatory Visit (INDEPENDENT_AMBULATORY_CARE_PROVIDER_SITE_OTHER): Payer: Medicare Other | Admitting: Internal Medicine

## 2018-05-30 ENCOUNTER — Other Ambulatory Visit: Payer: Self-pay

## 2018-05-30 ENCOUNTER — Encounter: Payer: Self-pay | Admitting: Internal Medicine

## 2018-05-30 VITALS — BP 125/83 | HR 56 | Temp 98.2°F | Ht 64.0 in | Wt 158.4 lb

## 2018-05-30 DIAGNOSIS — G4733 Obstructive sleep apnea (adult) (pediatric): Secondary | ICD-10-CM

## 2018-05-30 DIAGNOSIS — G8929 Other chronic pain: Secondary | ICD-10-CM | POA: Diagnosis not present

## 2018-05-30 DIAGNOSIS — M25559 Pain in unspecified hip: Secondary | ICD-10-CM | POA: Diagnosis not present

## 2018-05-30 DIAGNOSIS — R7303 Prediabetes: Secondary | ICD-10-CM | POA: Diagnosis not present

## 2018-05-30 DIAGNOSIS — I10 Essential (primary) hypertension: Secondary | ICD-10-CM

## 2018-05-30 DIAGNOSIS — G4700 Insomnia, unspecified: Secondary | ICD-10-CM

## 2018-05-30 DIAGNOSIS — Z79899 Other long term (current) drug therapy: Secondary | ICD-10-CM

## 2018-05-30 DIAGNOSIS — M25551 Pain in right hip: Secondary | ICD-10-CM

## 2018-05-30 DIAGNOSIS — Z1211 Encounter for screening for malignant neoplasm of colon: Secondary | ICD-10-CM

## 2018-05-30 DIAGNOSIS — G809 Cerebral palsy, unspecified: Secondary | ICD-10-CM | POA: Diagnosis not present

## 2018-05-30 DIAGNOSIS — Z79891 Long term (current) use of opiate analgesic: Secondary | ICD-10-CM

## 2018-05-30 DIAGNOSIS — M25552 Pain in left hip: Secondary | ICD-10-CM

## 2018-05-30 LAB — POCT GLYCOSYLATED HEMOGLOBIN (HGB A1C): Hemoglobin A1C: 5.6 % (ref 4.0–5.6)

## 2018-05-30 LAB — GLUCOSE, CAPILLARY: Glucose-Capillary: 102 mg/dL — ABNORMAL HIGH (ref 70–99)

## 2018-05-30 MED ORDER — TRAMADOL HCL 50 MG PO TABS: 50.0000 mg | ORAL_TABLET | Freq: Four times a day (QID) | ORAL | 0 refills | Status: DC | PRN

## 2018-05-30 MED ORDER — TRAMADOL HCL 50 MG PO TABS: 50.0000 mg | ORAL_TABLET | Freq: Every day | ORAL | 0 refills | Status: DC | PRN

## 2018-05-30 NOTE — Assessment & Plan Note (Signed)
Patient diagnosed with mild sleep apnea. She had a copy of her results sent to her and we reviewed them briefly today. CPAP not indicated at this time. Recommendations are for weight loss, sleeping off of her back, maybe a chin strap if needed, good sleep hygiene, and caution with sedating medications. - Continue to monitor

## 2018-05-30 NOTE — Patient Instructions (Addendum)
Thank you for allowing us to care for you  High Blood Pressure - BP well controlled today, continue current medications  Chronic Hip Pain due to Cerebral Palsy - Tramadol refilled today  OSA - results reviewed, continue to work on sleep hygiene and weight loss  We have checked an A1c to evaluate for pre-diabetes  You have been provided with cards to send in for colon cancer screening  Please follow up in about 6 months

## 2018-05-30 NOTE — Assessment & Plan Note (Signed)
Patient has chronic hip pian secondary to spastic cerebral palsy.Painremainsadequately controlled on tylenol and tramadol (1-2 tablets daily PRN). She states she has needed to use 2 a day more often than before, which does happen when the seasons change. Database reviewed and appropriate. - Tylenol (<3g daily) - Tramadol 50mg  Daily PRN #35 x2 (Each Rx with instruction to fill 30 days from prior)

## 2018-05-30 NOTE — Assessment & Plan Note (Signed)
BP today 125/83. This is improved from previous BP of 151/88. She has been taking medications and thinks her previous BP was due to being our of metoprolol. > Sleep study showed Mild OSA, CPAP not indicated at this time > Continue current regimen - HCTZ 12.5mg  Daily - Metoprolol 12.5mg  Daily

## 2018-05-30 NOTE — Assessment & Plan Note (Signed)
Patient has a history of insomnia. She continues to have some trouble sleeping. She was last prescribed Ambien on 05/17/2018. She states she does not take Ambien regularly and when she takes tramadol in the evening she does not need the Ambien. - Ambien 5mg  qhs

## 2018-05-30 NOTE — Assessment & Plan Note (Signed)
Patient has a history of cerebral palsy. She continues to have hip pain associated with her cerebral palsy. Her pain medication was refilled for this sequela. Please see separate hip pain assessment for details. - Refill Tramadol for hip pain associated with cerebral palsy

## 2018-05-30 NOTE — Assessment & Plan Note (Signed)
Patient has a history of prediabetes. Last A1c 5/31/20189 was 5.6. Will repeat A1c today. - A1c today stable at 5.6

## 2018-05-31 NOTE — Progress Notes (Signed)
Internal Medicine Clinic Attending  Case discussed with Dr. Melvin at the time of the visit.  We reviewed the resident's history and exam and pertinent patient test results.  I agree with the assessment, diagnosis, and plan of care documented in the resident's note.  Alexander Raines, M.D., Ph.D.  

## 2018-12-12 ENCOUNTER — Other Ambulatory Visit: Payer: Self-pay

## 2018-12-12 ENCOUNTER — Ambulatory Visit (INDEPENDENT_AMBULATORY_CARE_PROVIDER_SITE_OTHER): Payer: Medicare Other | Admitting: Internal Medicine

## 2018-12-12 ENCOUNTER — Encounter: Payer: Self-pay | Admitting: Internal Medicine

## 2018-12-12 VITALS — BP 116/83 | HR 76 | Temp 98.2°F | Ht 64.0 in | Wt 158.0 lb

## 2018-12-12 DIAGNOSIS — G4733 Obstructive sleep apnea (adult) (pediatric): Secondary | ICD-10-CM

## 2018-12-12 DIAGNOSIS — G809 Cerebral palsy, unspecified: Secondary | ICD-10-CM

## 2018-12-12 DIAGNOSIS — R062 Wheezing: Secondary | ICD-10-CM | POA: Diagnosis not present

## 2018-12-12 DIAGNOSIS — R1013 Epigastric pain: Secondary | ICD-10-CM | POA: Diagnosis not present

## 2018-12-12 DIAGNOSIS — R05 Cough: Secondary | ICD-10-CM | POA: Diagnosis not present

## 2018-12-12 DIAGNOSIS — R197 Diarrhea, unspecified: Secondary | ICD-10-CM

## 2018-12-12 DIAGNOSIS — R51 Headache: Secondary | ICD-10-CM

## 2018-12-12 DIAGNOSIS — R42 Dizziness and giddiness: Secondary | ICD-10-CM

## 2018-12-12 DIAGNOSIS — R0602 Shortness of breath: Secondary | ICD-10-CM | POA: Diagnosis not present

## 2018-12-12 DIAGNOSIS — F17210 Nicotine dependence, cigarettes, uncomplicated: Secondary | ICD-10-CM

## 2018-12-12 DIAGNOSIS — I10 Essential (primary) hypertension: Secondary | ICD-10-CM

## 2018-12-12 DIAGNOSIS — J069 Acute upper respiratory infection, unspecified: Secondary | ICD-10-CM

## 2018-12-12 MED ORDER — BENZONATATE 100 MG PO CAPS: 100.0000 mg | ORAL_CAPSULE | Freq: Four times a day (QID) | ORAL | 0 refills | Status: AC | PRN

## 2018-12-12 MED ORDER — ALBUTEROL SULFATE HFA 108 (90 BASE) MCG/ACT IN AERS: 2.0000 | INHALATION_SPRAY | Freq: Four times a day (QID) | RESPIRATORY_TRACT | 0 refills | Status: AC | PRN

## 2018-12-12 MED ORDER — ALBUTEROL SULFATE (2.5 MG/3ML) 0.083% IN NEBU
2.5000 mg | INHALATION_SOLUTION | Freq: Once | RESPIRATORY_TRACT | Status: AC
  Administered 2018-12-12: 2.5 mg via RESPIRATORY_TRACT

## 2018-12-12 NOTE — Patient Instructions (Signed)
It was a pleasure to see you today [name]. Please make the following changes:  -Please drink plenty of fluids and rest  -Please use tessalon pearls for your cough -Please use albuterol inhaler every 6 hours as needed for your shortness of breath  If you have any questions or concerns, please call our clinic at [phone] between 9am-5pm and after hours call [phone] and ask for the internal medicine resident on call. If you feel you are having a medical emergency please call 911.   Thank you, we look forward to help you remain healthy!  Lorenso Courier, MD Internal Medicine PGY2

## 2018-12-12 NOTE — Assessment & Plan Note (Signed)
The patient states that she has been having a dry cough to the point of gagging, chills, wheezing when laying down, dyspnea especially when walking with walker, decreased appetite, and soreness in her stomach. Denies runny nose and muscle aches. Her grandson was diagnosed with flu yesterday 12/11/18. She denies having the flu shot this year.   She is a current smoker 3/4 of a pack for the past 30 years. She does not have a known diagnosis of copd.   Assessment and plan  Patient likely had influenza but is out past the 72-hour window for Tamiflu administration.  Recommended the patient get plenty of rest and hydrate well.  -Prescribed Tessalon Perles for cough -Patient was given albuterol breathing treatment in office and prescribed an inhaler for her bronchospasms

## 2018-12-12 NOTE — Progress Notes (Signed)
   CC: Cough  HPI:  [name] is a 53 y.o. Female with cerebral palsy, essential hypertension, OSA who presents for cough. Please see problem based charting for evaluation, assessment, and plan.  Past Medical History:  Diagnosis Date  . Bipolar disorder (HCC)     Followed by mental health, who prescribe her bipolar meds  . Cerebral palsy (HCC)     with history of neuropathic pain  and spasticity,  requiring long-term pain medications  . Coronary artery disease 02/2008   a. s/p INF STEMI 5/09 tx with BMS to CFX;  b. Lexiscan Myoview 12/10 -  low risk, no ischemia.;   c. s/p NSTEMI 8/12: cath with LM 10%, LAD lum. irregs., oOM1 40%, mOM2 40%, mCFX stent occluded, RCA 50%, EF 45-50%; PCI - s/p DES to mCFX  d. Patent LCx stent, diffuse disease up to 50% in small nondominant RCA, 80% ostial stenosis in small OM3.   Marland Kitchen GERD (gastroesophageal reflux disease)   . History of pelvic mass    Found on CT abd/ pelvis (04/2008) - 13.3 cm cystic pelvis mass, possibly left ovarian origin. To be followed at [facility].   Marland Kitchen HLD (hyperlipidemia)   . Hypertension   . Vitamin D deficiency 11/2009    patient treated with high-dose vitamin D weekly x3 months,  levels were rechecked and it was determined that the patient continued to require high dose repletion of vitamin D.   Review of Systems:    Review of Systems  Constitutional: Positive for chills. Negative for fever.  Respiratory: Positive for shortness of breath.   Cardiovascular: Negative for chest pain.  Gastrointestinal: Positive for abdominal pain (soreness) and diarrhea. Negative for nausea and vomiting.  Musculoskeletal: Negative for myalgias.  Neurological: Positive for dizziness and headaches.   Physical Exam:  Vitals:   12/12/18 0956  BP: 116/83  Pulse: 76  Temp: 98.2 F (36.8 C)  TempSrc: Oral  SpO2: 97%  Weight: 158 lb (71.7 kg)  Height: 5\' 4"  (1.626 m)   Physical Exam  Constitutional: Appears well-developed and  well-nourished. No distress.  HENT:  Head: Normocephalic and atraumatic.  Eyes: Conjunctivae are normal.  Mouth: Mild posterior pharyngeal erythema noted without exudate, no anterior posterior cervical adenopathy appreciated Cardiovascular: Normal rate, regular rhythm and normal heart sounds.  Respiratory: Effort normal and breath sounds normal. No respiratory distress. No wheezes.  GI: Soft. Bowel sounds are normal. No distension.  Epigastric abdominal tenderness to palpation  Musculoskeletal: No edema.  Neurological: Is alert.  Skin: Not diaphoretic. No erythema.  Psychiatric: Normal mood and affect. Behavior is normal. Judgment and thought content normal.    Assessment & Plan:   See Encounters Tab for problem based charting.  Patient discussed with Dr. Heide Spark

## 2018-12-12 NOTE — Assessment & Plan Note (Signed)
She has a Mallampati score of 2 and she states that she has been short of breath when laying down.  Recommend considering CPAP use for the patient.  She will follow-up with PCP regarding this.

## 2018-12-13 NOTE — Progress Notes (Signed)
Internal Medicine Clinic Attending  Case discussed with Dr. Chundi at the time of the visit.  We reviewed the resident's history and exam and pertinent patient test results.  I agree with the assessment, diagnosis, and plan of care documented in the resident's note. 

## 2019-01-30 ENCOUNTER — Ambulatory Visit (INDEPENDENT_AMBULATORY_CARE_PROVIDER_SITE_OTHER): Payer: Medicare Other | Admitting: Internal Medicine

## 2019-01-30 ENCOUNTER — Encounter: Payer: Self-pay | Admitting: Internal Medicine

## 2019-01-30 ENCOUNTER — Other Ambulatory Visit: Payer: Self-pay

## 2019-01-30 DIAGNOSIS — M25551 Pain in right hip: Secondary | ICD-10-CM

## 2019-01-30 DIAGNOSIS — M25552 Pain in left hip: Secondary | ICD-10-CM

## 2019-01-30 DIAGNOSIS — G809 Cerebral palsy, unspecified: Secondary | ICD-10-CM

## 2019-01-30 DIAGNOSIS — G4700 Insomnia, unspecified: Secondary | ICD-10-CM

## 2019-01-30 DIAGNOSIS — I10 Essential (primary) hypertension: Secondary | ICD-10-CM | POA: Diagnosis not present

## 2019-01-30 DIAGNOSIS — K219 Gastro-esophageal reflux disease without esophagitis: Secondary | ICD-10-CM | POA: Diagnosis not present

## 2019-01-30 MED ORDER — TRAMADOL HCL 50 MG PO TABS: 50.0000 mg | ORAL_TABLET | Freq: Four times a day (QID) | ORAL | 0 refills | Status: DC | PRN

## 2019-01-30 MED ORDER — PANTOPRAZOLE SODIUM 40 MG PO TBEC: 40.0000 mg | DELAYED_RELEASE_TABLET | Freq: Every day | ORAL | 3 refills | Status: DC

## 2019-01-30 MED ORDER — ZOLPIDEM TARTRATE 5 MG PO TABS: 5.0000 mg | ORAL_TABLET | Freq: Every evening | ORAL | 2 refills | Status: DC | PRN

## 2019-01-30 MED ORDER — METOPROLOL SUCCINATE ER 25 MG PO TB24: 12.5000 mg | ORAL_TABLET | Freq: Every day | ORAL | 2 refills | Status: DC

## 2019-01-30 NOTE — Assessment & Plan Note (Signed)
BP at most recent visit 116/83  On 12/11/18. She has not check her BP at home. She request refill of her metoprolol, which will be provided. Will continue current regimen. - HCTZ 12.5mg  Daily - Metoprolol 12.5mg  Daily

## 2019-01-30 NOTE — Assessment & Plan Note (Signed)
Patient has a history of insomnia. She continues to have trouble sleeping. She was last prescribed Ambien on 05/30/2018. She has reported in the past that she does not take the medication regularly. Request refill today. - Ambien 5mg  qhs

## 2019-01-30 NOTE — Progress Notes (Signed)
  This is a telephone encounter between [name] and [name] on 01/30/2019. The visit was conducted with the patient located at home and [name] at [facility]. The patient's identity was confirmed using their DOB and current address. The patient has consented to being evaluated through a telephone encounter and understands the associated risks (an examination cannot be done and the patient may need to come in for an appointment) / benefits (allows the patient to remain at home, decreasing exposure to coronavirus). I personally spent 6 minutes on medical discussion.  CC: Hip Pain, Hypertension, GERD, Insomnia  HPI:   [name] is a 53 y.o. F with PMHx listed below presenting for Hip Pain, Hypertension, GERD, Insomnia. Please see the A&P for the status of the patient's chronic medical problems.   Past Medical History:  Diagnosis Date  . Bipolar disorder (HCC)     Followed by mental health, who prescribe her bipolar meds  . Cerebral palsy (HCC)     with history of neuropathic pain  and spasticity,  requiring long-term pain medications  . Coronary artery disease 02/2008   a. s/p INF STEMI 5/09 tx with BMS to CFX;  b. Lexiscan Myoview 12/10 -  low risk, no ischemia.;   c. s/p NSTEMI 8/12: cath with LM 10%, LAD lum. irregs., oOM1 40%, mOM2 40%, mCFX stent occluded, RCA 50%, EF 45-50%; PCI - s/p DES to mCFX  d. Patent LCx stent, diffuse disease up to 50% in small nondominant RCA, 80% ostial stenosis in small OM3.   Marland Kitchen GERD (gastroesophageal reflux disease)   . History of pelvic mass    Found on CT abd/ pelvis (04/2008) - 13.3 cm cystic pelvis mass, possibly left ovarian origin. To be followed at [facility].   Marland Kitchen HLD (hyperlipidemia)   . Hypertension   . Vitamin D deficiency 11/2009    patient treated with high-dose vitamin D weekly x3 months,  levels were rechecked and it was determined that the patient continued to require high dose repletion of vitamin D.   Review of Systems:   Performed and all others negative.  Physical Exam:  There were no vitals filed for this visit. Not performed for tele-health visit  Assessment & Plan:   See Encounters Tab for problem based charting.  Patient discussed with Dr. Heide Spark

## 2019-01-30 NOTE — Patient Instructions (Signed)
Verbal instructions provided for tele-health visit 

## 2019-01-30 NOTE — Assessment & Plan Note (Signed)
Patient has chronic hip pain due to spastic cerebral palsy.Painremainsadequatelycontrolled on tylenol andtramadol(1-2 tablets daily PRN).Recently she has been needing to use 2 tabs per day more frequently, which does happen when the seasons change, but she is requesting to evaluate her for increased # when pain contract is re-done at next visit. Will discuss at follow up. Database reviewed and appropriate. - Tylenol (<3g daily) - Tramadol 50mg  Daily PRN#35  (Each Rx with instruction to fill 30 days from prior)

## 2019-01-30 NOTE — Assessment & Plan Note (Signed)
Patient has a history of GERD, controlled on Protonix. She is requesting a refill today. - Refill Protonix 40mg  Daily

## 2019-01-31 NOTE — Progress Notes (Signed)
Internal Medicine Clinic Attending  Case discussed with Dr. Melvin  at the time of the visit.  We reviewed the resident's history and pertinent patient test results.  I agree with the assessment, diagnosis, and plan of care documented in the resident's note.  

## 2019-04-26 ENCOUNTER — Other Ambulatory Visit: Payer: Self-pay | Admitting: Internal Medicine

## 2019-04-26 DIAGNOSIS — I10 Essential (primary) hypertension: Secondary | ICD-10-CM

## 2019-04-28 NOTE — Telephone Encounter (Signed)
Refill approved.

## 2019-06-26 ENCOUNTER — Encounter: Payer: Medicare Other | Admitting: Internal Medicine

## 2019-07-03 ENCOUNTER — Encounter (INDEPENDENT_AMBULATORY_CARE_PROVIDER_SITE_OTHER): Payer: Self-pay

## 2019-07-03 ENCOUNTER — Encounter: Payer: Self-pay | Admitting: Internal Medicine

## 2019-07-03 ENCOUNTER — Ambulatory Visit (INDEPENDENT_AMBULATORY_CARE_PROVIDER_SITE_OTHER): Payer: Medicare Other | Admitting: Internal Medicine

## 2019-07-03 ENCOUNTER — Other Ambulatory Visit: Payer: Self-pay

## 2019-07-03 DIAGNOSIS — M25552 Pain in left hip: Secondary | ICD-10-CM

## 2019-07-03 DIAGNOSIS — I2510 Atherosclerotic heart disease of native coronary artery without angina pectoris: Secondary | ICD-10-CM

## 2019-07-03 DIAGNOSIS — M25551 Pain in right hip: Secondary | ICD-10-CM

## 2019-07-03 DIAGNOSIS — K219 Gastro-esophageal reflux disease without esophagitis: Secondary | ICD-10-CM

## 2019-07-03 DIAGNOSIS — Z0000 Encounter for general adult medical examination without abnormal findings: Secondary | ICD-10-CM

## 2019-07-03 DIAGNOSIS — G4733 Obstructive sleep apnea (adult) (pediatric): Secondary | ICD-10-CM | POA: Diagnosis not present

## 2019-07-03 DIAGNOSIS — I10 Essential (primary) hypertension: Secondary | ICD-10-CM | POA: Diagnosis not present

## 2019-07-03 DIAGNOSIS — Z79891 Long term (current) use of opiate analgesic: Secondary | ICD-10-CM

## 2019-07-03 DIAGNOSIS — Z79899 Other long term (current) drug therapy: Secondary | ICD-10-CM

## 2019-07-03 DIAGNOSIS — G4700 Insomnia, unspecified: Secondary | ICD-10-CM

## 2019-07-03 DIAGNOSIS — G801 Spastic diplegic cerebral palsy: Secondary | ICD-10-CM

## 2019-07-03 DIAGNOSIS — G8929 Other chronic pain: Secondary | ICD-10-CM

## 2019-07-03 DIAGNOSIS — M25559 Pain in unspecified hip: Secondary | ICD-10-CM

## 2019-07-03 DIAGNOSIS — Z7982 Long term (current) use of aspirin: Secondary | ICD-10-CM

## 2019-07-03 DIAGNOSIS — G809 Cerebral palsy, unspecified: Secondary | ICD-10-CM

## 2019-07-03 MED ORDER — METOPROLOL SUCCINATE ER 25 MG PO TB24: ORAL_TABLET | ORAL | 3 refills | Status: DC

## 2019-07-03 MED ORDER — TRAMADOL HCL 50 MG PO TABS: 50.0000 mg | ORAL_TABLET | Freq: Four times a day (QID) | ORAL | 0 refills | Status: DC | PRN

## 2019-07-03 MED ORDER — HYDROCHLOROTHIAZIDE 12.5 MG PO CAPS: 12.5000 mg | ORAL_CAPSULE | Freq: Every day | ORAL | 11 refills | Status: DC

## 2019-07-03 MED ORDER — ROSUVASTATIN CALCIUM 20 MG PO TABS: 20.0000 mg | ORAL_TABLET | Freq: Every day | ORAL | 11 refills | Status: DC

## 2019-07-03 MED ORDER — NITROGLYCERIN 0.4 MG SL SUBL: 0.4000 mg | SUBLINGUAL_TABLET | SUBLINGUAL | 0 refills | Status: DC | PRN

## 2019-07-03 MED ORDER — ZOLPIDEM TARTRATE 5 MG PO TABS: 5.0000 mg | ORAL_TABLET | Freq: Every evening | ORAL | 2 refills | Status: DC | PRN

## 2019-07-03 NOTE — Patient Instructions (Signed)
Verbal instruction given for tele-health visit. 

## 2019-07-03 NOTE — Assessment & Plan Note (Signed)
Patient has a history of insomnia. She continues to have trouble sleeping. She has needed Ambien about 3 nights per week. Request refill today. -Ambien 5mg  qhs

## 2019-07-03 NOTE — Assessment & Plan Note (Addendum)
Patient due for Colonoscopy and Mammogram, she elects to defer due to COVID-19 Pandemic. She is also due for lab work. - Future orders placed for lab work CBC / BMP. She states she will be able to come in about 1 month. She states she will call to schedule.

## 2019-07-03 NOTE — Addendum Note (Signed)
Addended by: Neva Seat B on: 07/03/2019 10:01 AM   Modules accepted: Orders

## 2019-07-03 NOTE — Assessment & Plan Note (Addendum)
Unable to assess BP for tele-health visit. Patient requesting refills today. - Metoprolol 12.5mg  Daily - HCTZ 12.5mg  Daily  - Future orders placed for lab work CBC / BMP. She states she will be able to come in about 1 month. She states she will call to schedule.

## 2019-07-03 NOTE — Progress Notes (Signed)
  This is a telephone encounter between Lizabeth Leyden and Neva Seat on 07/03/2019. The visit was conducted with the patient located at home and Neva Seat at [facility]. The patient's identity was confirmed using their DOB and current address. The patient has consented to being evaluated through a telephone encounter and understands the associated risks (an examination cannot be done and the patient may need to come in for an appointment) / benefits (allows the patient to remain at home, decreasing exposure to coronavirus). I personally spent 7 minutes on medical discussion.  CC: CAD, OSA, GERD, Hip Pain, Insomnia  HPI:  Ms.Janal G Dechaine is a 53 y.o. F with PMHx listed below presenting for CAD, OSA, GERD, Hip Pain, Insomnia. Please see the A&P for the status of the patient's chronic medical problems.  Past Medical History:  Diagnosis Date  . Bipolar disorder ([place])     Followed by mental health, who prescribe her bipolar meds  . Bradycardia 06/20/2011  . Breast abscess 05/07/2015  . Cerebral palsy ([place])     with history of neuropathic pain  and spasticity,  requiring long-term pain medications  . Chest pain at rest 10/03/2013  . Coronary artery disease 02/2008   a. s/p INF STEMI 5/09 tx with BMS to CFX;  b. Lexiscan Myoview 12/10 -  low risk, no ischemia.;   c. s/p NSTEMI 8/12: cath with LM 10%, LAD lum. irregs., oOM1 40%, mOM2 40%, mCFX stent occluded, RCA 50%, EF 45-50%; PCI - s/p DES to mCFX  d. Patent LCx stent, diffuse disease up to 50% in small nondominant RCA, 80% ostial stenosis in small OM3.   . Frontal headache 11/21/2011  . GERD (gastroesophageal reflux disease)   . History of pelvic mass    Found on CT abd/ pelvis (04/2008) - 13.3 cm cystic pelvis mass, possibly left ovarian origin. To be followed at [facility]   Marland Kitchen HLD (hyperlipidemia)   . Hypertension   . Prediabetes 11/21/2011  . Vitamin D deficiency 11/2009    patient treated with high-dose vitamin D weekly x3 months,   levels were rechecked and it was determined that the patient continued to require high dose repletion of vitamin D.   Review of Systems:  Performed and all others negative.  Physical Exam: There were no vitals filed for this visit. Not performed for tele-health visit  Assessment & Plan:   See Encounters Tab for problem based charting.  Patient discussed with Dr. Evette Doffing

## 2019-07-03 NOTE — Assessment & Plan Note (Signed)
Patient has known CAD. She is tolerating her medication well and denies chest pain or shortness of breath. She is requesting refills, which will be provided. - Metoprolol 12.5mg  Daily - ASA 81mg  Daily - Rosuvastatin 20mg  Daily - Nitrostat 0.4mg  PRN

## 2019-07-03 NOTE — Assessment & Plan Note (Signed)
Patient has chronic hip pain due to spastic cerebral palsy.Painremainsadequatelycontrolled on tylenol andtramadol(1-2 tablets daily PRN).She has needed 2 tabs per day more frequently recently. . Will discuss possible # increase at follow up. Database reviewed and appropriate. - Tylenol (<3g daily) - Tramadol 50mg  Daily PRN#35(Each Rx with instruction to fill 30 days from prior)

## 2019-07-03 NOTE — Assessment & Plan Note (Signed)
She states while she has continued insomnia (documented elsewhere) she has not had shortness of breath since her respiratory illness improved. Recommend good sleep hygiene. Will monitor - Continue to monitor - Consider CPAP if symptoms progress

## 2019-07-04 NOTE — Progress Notes (Signed)
Internal Medicine Clinic Attending  Case discussed with Dr. Melvin  at the time of the visit.  We reviewed the resident's history and exam and pertinent patient test results.  I agree with the assessment, diagnosis, and plan of care documented in the resident's note.  

## 2020-02-06 ENCOUNTER — Encounter: Payer: Self-pay | Admitting: *Deleted

## 2020-02-24 DIAGNOSIS — H524 Presbyopia: Secondary | ICD-10-CM | POA: Diagnosis not present

## 2020-04-20 ENCOUNTER — Other Ambulatory Visit: Payer: Self-pay | Admitting: Internal Medicine

## 2020-04-20 DIAGNOSIS — I10 Essential (primary) hypertension: Secondary | ICD-10-CM

## 2020-05-10 ENCOUNTER — Encounter: Payer: Medicare Other | Admitting: Student

## 2020-05-20 ENCOUNTER — Other Ambulatory Visit: Payer: Self-pay

## 2020-05-20 ENCOUNTER — Encounter: Payer: Self-pay | Admitting: Student

## 2020-05-20 ENCOUNTER — Ambulatory Visit (INDEPENDENT_AMBULATORY_CARE_PROVIDER_SITE_OTHER): Payer: Medicare Other | Admitting: Student

## 2020-05-20 VITALS — BP 127/72 | HR 55 | Ht 65.0 in | Wt 164.9 lb

## 2020-05-20 DIAGNOSIS — Z0001 Encounter for general adult medical examination with abnormal findings: Secondary | ICD-10-CM | POA: Diagnosis not present

## 2020-05-20 DIAGNOSIS — I2510 Atherosclerotic heart disease of native coronary artery without angina pectoris: Secondary | ICD-10-CM

## 2020-05-20 DIAGNOSIS — E785 Hyperlipidemia, unspecified: Secondary | ICD-10-CM

## 2020-05-20 DIAGNOSIS — F329 Major depressive disorder, single episode, unspecified: Secondary | ICD-10-CM | POA: Insufficient documentation

## 2020-05-20 DIAGNOSIS — Z0000 Encounter for general adult medical examination without abnormal findings: Secondary | ICD-10-CM | POA: Diagnosis not present

## 2020-05-20 DIAGNOSIS — G43009 Migraine without aura, not intractable, without status migrainosus: Secondary | ICD-10-CM

## 2020-05-20 DIAGNOSIS — G4700 Insomnia, unspecified: Secondary | ICD-10-CM

## 2020-05-20 DIAGNOSIS — I10 Essential (primary) hypertension: Secondary | ICD-10-CM | POA: Diagnosis not present

## 2020-05-20 DIAGNOSIS — F324 Major depressive disorder, single episode, in partial remission: Secondary | ICD-10-CM

## 2020-05-20 DIAGNOSIS — K219 Gastro-esophageal reflux disease without esophagitis: Secondary | ICD-10-CM

## 2020-05-20 DIAGNOSIS — M7918 Myalgia, other site: Secondary | ICD-10-CM

## 2020-05-20 DIAGNOSIS — G809 Cerebral palsy, unspecified: Secondary | ICD-10-CM

## 2020-05-20 DIAGNOSIS — M25551 Pain in right hip: Secondary | ICD-10-CM

## 2020-05-20 DIAGNOSIS — M25552 Pain in left hip: Secondary | ICD-10-CM

## 2020-05-20 MED ORDER — TRAMADOL HCL 50 MG PO TABS: 50.0000 mg | ORAL_TABLET | Freq: Four times a day (QID) | ORAL | 0 refills | Status: DC | PRN

## 2020-05-20 MED ORDER — ROSUVASTATIN CALCIUM 20 MG PO TABS: 20.0000 mg | ORAL_TABLET | Freq: Every day | ORAL | 3 refills | Status: DC

## 2020-05-20 MED ORDER — AMITRIPTYLINE HCL 10 MG PO TABS: 10.0000 mg | ORAL_TABLET | Freq: Every day | ORAL | 1 refills | Status: DC

## 2020-05-20 MED ORDER — PANTOPRAZOLE SODIUM 40 MG PO TBEC: DELAYED_RELEASE_TABLET | ORAL | 3 refills | Status: DC

## 2020-05-20 MED ORDER — ZOLPIDEM TARTRATE 5 MG PO TABS: 5.0000 mg | ORAL_TABLET | Freq: Every evening | ORAL | 2 refills | Status: DC | PRN

## 2020-05-20 MED ORDER — NITROGLYCERIN 0.4 MG SL SUBL: 0.4000 mg | SUBLINGUAL_TABLET | SUBLINGUAL | 0 refills | Status: AC | PRN

## 2020-05-20 MED ORDER — METOPROLOL SUCCINATE ER 25 MG PO TB24: ORAL_TABLET | ORAL | 3 refills | Status: DC

## 2020-05-20 NOTE — Assessment & Plan Note (Addendum)
Reports continued hip pain due to cerebral palsy. Normally gets around her house with a walker, uses a wheelchair when leaving the house. Has been using Tramadol for pain, last filled in April 2020. States she has been using a lot of Tylenol, 3-5 650mg  tablets per day. Requests increase in the number of the tablets because they are insufficient to last the month, but they were last filled well over 1 year ago.  -refilled Tramadol 50mg , 35 tablets for 1 month -consider updating pain contract and increasing number of tablets at next visit -check CMP for frequent tylenol use  Addendum: no impairment to liver on CMP today

## 2020-05-20 NOTE — Assessment & Plan Note (Signed)
-  CBC, BMP, lipid panel today -fecal immunochemical test -mammogram ordered -counseled on COVID vaccine. She will consider getting it

## 2020-05-20 NOTE — Assessment & Plan Note (Signed)
Last lipid panel was in 2018, benign testing at that time while on 40mg  Lipitor.  -continue Crestor 20mg 

## 2020-05-20 NOTE — Progress Notes (Signed)
   CC: follow up: hip pain, migraines, hypertension, coronary artery disease, GERD, and preventive health  HPI:  [name] is a 54 y.o. female with history as below presenting for follow up for the above chronic problems. Please refer to problem based charting for further details of assessment and plan of current problem and chronic medical conditions.  Past Medical History:  Diagnosis Date  . Bipolar disorder (HCC)     Followed by mental health, who prescribe her bipolar meds  . Bradycardia 06/20/2011  . Breast abscess 05/07/2015  . Cerebral palsy ([facility])     with history of neuropathic pain  and spasticity,  requiring long-term pain medications  . Chest pain at rest 10/03/2013  . Coronary artery disease 02/2008   a. s/p INF STEMI 5/09 tx with BMS to CFX;  b. Lexiscan Myoview 12/10 -  low risk, no ischemia.;   c. s/p NSTEMI 8/12: cath with LM 10%, LAD lum. irregs., oOM1 40%, mOM2 40%, mCFX stent occluded, RCA 50%, EF 45-50%; PCI - s/p DES to mCFX  d. Patent LCx stent, diffuse disease up to 50% in small nondominant RCA, 80% ostial stenosis in small OM3.   . Frontal headache 11/21/2011  . GERD (gastroesophageal reflux disease)   . History of pelvic mass    Found on CT abd/ pelvis (04/2008) - 13.3 cm cystic pelvis mass, possibly left ovarian origin. To be [facility] at [facility].   Marland Kitchen HLD (hyperlipidemia)   . Hypertension   . Prediabetes 11/21/2011  . Vitamin D deficiency 11/2009    patient treated with high-dose vitamin D weekly x3 months,  levels were rechecked and it was determined that the patient continued to require high dose repletion of vitamin D.   Review of Systems:   Review of Systems  Constitutional: Negative for chills and fever.  HENT: Negative for congestion and sore throat.   Eyes: Negative for blurred vision and double vision.  Respiratory: Negative for cough and shortness of breath.   Cardiovascular: Negative for chest pain.  Gastrointestinal: Positive for abdominal  pain and nausea (with migraines). Negative for constipation and vomiting.  Genitourinary: Negative for dysuria and frequency.  Musculoskeletal: Positive for myalgias.  Skin: Negative for rash.  Neurological: Positive for headaches.     Physical Exam: Vitals:   05/20/20 0907  BP: 127/72  Pulse: (!) 55  SpO2: 100%  Weight: 164 lb 14.4 oz (74.8 kg)  Height: 5\' 5"  (1.651 m)   Constitutional: no acute distress Head: atraumatic ENT: external ears normal Cardiovascular: regular rate and rhythm, normal heart sounds Pulmonary: effort normal, normal breath sounds bilaterally Abdominal: flat, nontender, no rebound tenderness, bowel sounds normal Musculoskeletal: sitting in wheelchair, contractures from cerebral palsy, bilateral leg muscles are thin and atrophied Skin: warm and dry Neurological: alert, no focal deficit Psychiatric: normal mood and affect  Assessment & Plan:   See Encounters Tab for problem based charting.  Patient seen with Dr. 

## 2020-05-20 NOTE — Assessment & Plan Note (Signed)
Reports increased acid reflux since running out of her Protonix. States that it was well managed previously.   -Refill Protonix 40mg  daily

## 2020-05-20 NOTE — Assessment & Plan Note (Signed)
BP 127/72 today. Is only taking her Toprol every other day in order to stretch out her medications. She is taking her blood pressure at home and states it is normally around 120s systolic when she takes her medication, up to 150s when she does not. Is not taking hctz because of the frequent urination.  -stop hctz -continue Toprol 12.5mg  daily

## 2020-05-20 NOTE — Assessment & Plan Note (Addendum)
PHQ-9 score of 11 today. Offered her medical treatment or therapy, but she declines both at this time. Denies SI, HI, or hallucinations. Cites her difficult social and medical situations are sources of stress.  -monitor at subsequent visits -we are starting amitriptyline 10mg  today for comorbid conditions

## 2020-05-20 NOTE — Assessment & Plan Note (Addendum)
Reports continued insomnia. Uses Ambien sparingly, and has daytime sedation the next morning.   -start amitriptyline 10mg , which is also being used for her migraines -refilled Ambien to be used sparingly

## 2020-05-20 NOTE — Patient Instructions (Signed)
Thank you for allowing [place] to be a part of your care today, it was pleasure seeing you. We discussed your hip pain, migraines, hypertension, coronary artery disease, GERD, and preventive health.  I am checking these labs: CBC, CMP, lipid panel  I have made these changes to your medications: Start amitriptyline 10mg  at bedtime which should improve your migraines and your sleep  I refilled the following. You should have enough refills to last for about a year for most of these, aside from the Tramadol. Toprol - take this every day Nitroglycerin Ambien Tramadol Crestor Protonix   Please follow up in 4 weeks by telephone visit to see how the amitriptyline is working and for possible dose adjustment. Otherwise, follow up in 6 months to readdress your chronic issues.   Thank you, and please call the Internal Medicine Clinic at [phone] if you have any questions.  Best, Dr. [phone]

## 2020-05-20 NOTE — Assessment & Plan Note (Signed)
Taking ASA81mg , prescribed Rosuvastatin 20mg  but ran out of prescription. BP treatment with Toprol. No recent CP. Has nitroglycerin for prn use but has not needed it in the past year.  -continue ASA 81mg  -restart Rosuvastatin 20mg  -continue Toprol -refill nitroglycerin

## 2020-05-20 NOTE — Assessment & Plan Note (Signed)
Reports continued migraines consistent in quality with prior headaches. Reports that they occur roughly twice a week and last several hours. Has been using Tylenol for pain relief. Not prescribing triptan for hx of CAD.  -start amitriptyline 10mg  for migraine prophylaxis -reassess by video visit in 4 weeks

## 2020-05-21 ENCOUNTER — Other Ambulatory Visit: Payer: Self-pay | Admitting: Student

## 2020-05-21 LAB — CBC
Hematocrit: 40.3 % (ref 34.0–46.6)
Hemoglobin: 13.6 g/dL (ref 11.1–15.9)
MCH: 30.4 pg (ref 26.6–33.0)
MCHC: 33.7 g/dL (ref 31.5–35.7)
MCV: 90 fL (ref 79–97)
Platelets: 245 10*3/uL (ref 150–450)
RBC: 4.47 x10E6/uL (ref 3.77–5.28)
RDW: 13 % (ref 11.7–15.4)
WBC: 7.7 10*3/uL (ref 3.4–10.8)

## 2020-05-21 LAB — LIPID PANEL
Chol/HDL Ratio: 3.8 ratio (ref 0.0–4.4)
Cholesterol, Total: 205 mg/dL — ABNORMAL HIGH (ref 100–199)
HDL: 54 mg/dL (ref >39)
LDL Chol Calc (NIH): 141 mg/dL — ABNORMAL HIGH (ref 0–99)
Triglycerides: 54 mg/dL (ref 0–149)
VLDL Cholesterol Cal: 10 mg/dL (ref 5–40)

## 2020-05-21 LAB — CMP14 + ANION GAP
ALT: 11 IU/L (ref 0–32)
AST: 12 IU/L (ref 0–40)
Albumin/Globulin Ratio: 1.6 (ref 1.2–2.2)
Albumin: 4.4 g/dL (ref 3.8–4.9)
Alkaline Phosphatase: 87 IU/L (ref 48–121)
Anion Gap: 14 mmol/L (ref 10.0–18.0)
BUN/Creatinine Ratio: 22 (ref 9–23)
BUN: 13 mg/dL (ref 6–24)
Bilirubin Total: 0.2 mg/dL (ref 0.0–1.2)
CO2: 23 mmol/L (ref 20–29)
Calcium: 9.5 mg/dL (ref 8.7–10.2)
Chloride: 104 mmol/L (ref 96–106)
Creatinine, Ser: 0.58 mg/dL (ref 0.57–1.00)
GFR calc Af Amer: 120 mL/min/{1.73_m2} (ref >59)
GFR calc non Af Amer: 104 mL/min/{1.73_m2} (ref >59)
Globulin, Total: 2.8 g/dL (ref 1.5–4.5)
Glucose: 83 mg/dL (ref 65–99)
Potassium: 4.1 mmol/L (ref 3.5–5.2)
Sodium: 141 mmol/L (ref 134–144)
Total Protein: 7.2 g/dL (ref 6.0–8.5)

## 2020-05-21 NOTE — Progress Notes (Signed)
Internal Medicine Clinic Attending  I saw and evaluated the patient.  I personally confirmed the key portions of the history and exam documented by Dr. Imogene Burn and I reviewed pertinent patient test results.  The assessment, diagnosis, and plan were formulated together and I agree with the documentation in the resident's note.  Will start amitriptyline for migraine prophylaxis and sleep, starting at 10 mg qHS. Can titrate up to effect. May also help with chronic pain, but refilled tramadol as well. Clarified that would not recommend therapy with ambien at the same time, Dr. Imogene Burn will call her and explain.  Stopping HCTZ as it is difficult to get to the bathroom, BP well controlled at this time, will track carefully.  Jessy Oto, M.D., Ph.D.

## 2020-06-02 ENCOUNTER — Other Ambulatory Visit: Payer: Self-pay | Admitting: Student

## 2020-06-17 ENCOUNTER — Encounter: Payer: Medicare Other | Admitting: Internal Medicine

## 2020-06-21 ENCOUNTER — Other Ambulatory Visit: Payer: Self-pay

## 2020-06-21 ENCOUNTER — Ambulatory Visit (INDEPENDENT_AMBULATORY_CARE_PROVIDER_SITE_OTHER): Payer: Medicare Other | Admitting: Internal Medicine

## 2020-06-21 DIAGNOSIS — G44001 Cluster headache syndrome, unspecified, intractable: Secondary | ICD-10-CM | POA: Diagnosis not present

## 2020-06-21 DIAGNOSIS — G44051 Short lasting unilateral neuralgiform headache with conjunctival injection and tearing (SUNCT), intractable: Secondary | ICD-10-CM | POA: Diagnosis not present

## 2020-06-21 MED ORDER — VERAPAMIL HCL ER 120 MG PO TBCR
120.0000 mg | EXTENDED_RELEASE_TABLET | Freq: Two times a day (BID) | ORAL | 0 refills | Status: DC
Start: 2020-06-21 — End: 2021-06-15

## 2020-06-21 NOTE — Progress Notes (Signed)
[facility] Internal Medicine Residency Telephone Encounter Continuity Care Appointment   CC: headache management   HPI:  This telephone encounter was created for [name] on 06/21/2020 for follow up of headache management.  Initial OV for this was on 8/12 with Dr. Imogene Burn. Chart review indicates that she was started on amitriptyline 10mg  for migraine prophylaxis and was told to follow up in 4w.  Today (9/13): I obtained further history regarding the nature of her headaches. She notes that they have been going on for about 66mo. They have been occurring at least 3x/week and occur primarily at night, awakening her from sleep. She describes the pain as a sharp, stabbing sensation in one eye. They are associated with tearing of the eye, nausea, vomiting, and severe pain. They are not positional in nature. She denies any other recent neurological changes. She notes that the amitriptyline has been making her nauseous and has minimally improved her symptoms. She reports that NSAIDs do not help. She does feel that tylenol helps. If she does not take any medication, symptoms will persistent all night.    Past Medical History:  Past Medical History:  Diagnosis Date  . Bipolar disorder (H[facility])     Followed by mental health, who prescribe her bipolar meds  . Bradycardia 06/20/2011  . Breast abscess 05/07/2015  . Cerebral palsy ([facility])     with history of neuropathic pain  and spasticity,  requiring long-term pain medications  . Chest pain at rest 10/03/2013  . Coronary artery disease 02/2008   a. s/p INF STEMI 5/09 tx with BMS to CFX;  b. Lexiscan Myoview 12/10 -  low risk, no ischemia.;   c. s/p NSTEMI 8/12: cath with LM 10%, LAD lum. irregs., oOM1 40%, mOM2 40%, mCFX stent occluded, RCA 50%, EF 45-50%; PCI - s/p DES to mCFX  d. Patent LCx stent, diffuse disease up to 50% in small nondominant RCA, 80% ostial stenosis in small OM3.   . Frontal headache 11/21/2011  . GERD (gastroesophageal reflux  disease)   . History of pelvic mass    Found on CT abd/ pelvis (04/2008) - 13.3 cm cystic pelvis mass, possibly left ovarian origin. To be followed at [facility].   [facility] HLD (hyperlipidemia)   . Hypertension   . Prediabetes 11/21/2011  . Vitamin D deficiency 11/2009    patient treated with high-dose vitamin D weekly x3 months,  levels were rechecked and it was determined that the patient continued to require high dose repletion of vitamin D.      ROS:  Review of Systems  Eyes: Positive for photophobia, pain and discharge.  Gastrointestinal: Positive for nausea and vomiting.  Neurological: Positive for headaches. Negative for sensory change and focal weakness.      Assessment / Plan / Recommendations:  Problem List Items Addressed This Visit      Nervous and Auditory   Cluster headache, intractable    Today's visit was via telehealth for follow up on migraine management.  I obtained further history regarding the nature of her headaches. She notes that they have been going on for about 69mo. They have been occurring at least 3x/week and occur primarily at night, awakening her from sleep. She describes the pain as a sharp, stabbing sensation in one eye. They are associated with tearing of the eye, nausea, vomiting, and severe pain. They are not positional in nature. She denies any other recent neurological changes. She notes that the amitriptyline has been making her nauseous and  has minimally improved her symptoms. She reports that NSAIDs do not help. She does feel that tylenol helps. If she does not take any medication, symptoms will persistent all night.  Assessment: overall symptoms are most consistent with cluster headaches. Given her age, fairly recent onset, and them waking her up at night, I do feel that head imaging is warranted.   Plan --MRI brain to r/o neoplasm or other space occupying lesion --will also have her trial verapamil for prophylaxis. Will start with 120mg  BID and have her  increase the dose every 1-2 weeks up to 240mg  twice daily until headaches subside or she develops adverse symptoms. Dose can be titrated up to 960mg  daily. Verapamil does increase the risk for cardiac arrhythmias so would recommend obtaining an EKG prior to increasing the dose above 240mg  twice daily. Benefit of verapamil typically takes 2-3w to take effect. --f/u after MRI is complete --if MRI is negative and symptoms are persisting, can trial prednisone taper as follows: 100mg  x5d followed by tapering by 20mg  every 3 days.       Relevant Medications   verapamil (CALAN-SR) 120 MG CR tablet    Other Visit Diagnoses    Short lasting unilateral neuralgiform headache with conjunctival injection and tearing (SUNCT), intractable       Relevant Medications   verapamil (CALAN-SR) 120 MG CR tablet   Other Relevant Orders   MR Brain W Wo Contrast       As always, pt is advised that if symptoms worsen or new symptoms arise, they should go to an urgent care facility or to to ER for further evaluation.   Consent and Medical Decision Making:   Patient discussed with Dr.  This is a telephone encounter between [name] and Ilya Ess on 06/21/2020 for migraine follow up. The visit was conducted with the patient located at home and at [facility]. The patient's identity was confirmed using their DOB and current address. The patient has consented to being evaluated through a telephone encounter and understands the associated risks (an examination cannot be done and the patient may need to come in for an appointment) / benefits (allows the patient to remain at home, decreasing exposure to coronavirus). I personally spent 30 minutes on medical discussion.    , MD Internal Medicine Resident PGY-2 [facility] Res[facility] Pager: [phone] 06/22/2020 9:37 AM

## 2020-06-22 NOTE — Assessment & Plan Note (Addendum)
Today's visit was via telehealth for follow up on migraine management.  I obtained further history regarding the nature of her headaches. She notes that they have been going on for about 85mo. They have been occurring at least 3x/week and occur primarily at night, awakening her from sleep. She describes the pain as a sharp, stabbing sensation in one eye. They are associated with tearing of the eye, nausea, vomiting, and severe pain. They are not positional in nature. She denies any other recent neurological changes. She notes that the amitriptyline has been making her nauseous and has minimally improved her symptoms. She reports that NSAIDs do not help. She does feel that tylenol helps. If she does not take any medication, symptoms will persistent all night.  Assessment: overall symptoms are most consistent with cluster headaches. Given her age, fairly recent onset, and them waking her up at night, I do feel that head imaging is warranted.   Plan --MRI brain to r/o neoplasm or other space occupying lesion --will also have her trial verapamil for prophylaxis. Will start with 120mg  BID and have her increase the dose every 1-2 weeks up to 240mg  twice daily until headaches subside or she develops adverse symptoms. Dose can be titrated up to 960mg  daily. Verapamil does increase the risk for cardiac arrhythmias so would recommend obtaining an EKG prior to increasing the dose above 240mg  twice daily. Benefit of verapamil typically takes 2-3w to take effect. --f/u after MRI is complete --if MRI is negative and symptoms are persisting, can trial prednisone taper as follows: 100mg  x5d followed by tapering by 20mg  every 3 days.

## 2020-06-23 NOTE — Progress Notes (Signed)
Internal Medicine Clinic Attending  Case discussed with Dr. Ephriam Knuckles  At the time of the visit.  We reviewed the resident's history and pertinent patient test results.  I agree with the assessment, diagnosis, and plan of care documented in the resident's note.   54 year old patient with several months of new onset headaches. Being awoken from sleep with severe headache is a red flag for intracranial mass. We will order MRI brain to rule out a mass/neoplasm.

## 2020-06-29 ENCOUNTER — Other Ambulatory Visit: Payer: Self-pay

## 2020-06-29 NOTE — Telephone Encounter (Signed)
Patient saw Dr. Ephriam Knuckles on 06/21/20 and was started on Verapamil for migraine prophylaxis.  Refill request from pharmacy for Amitriptyline.  Please clarify if patient is to stay on amitriptyline and if so, please resend amitriptyline RX (it was in print mode when sent, please change to normal) if appropriate. Thank you, SChaplin, RN,BSN

## 2020-07-19 ENCOUNTER — Ambulatory Visit (HOSPITAL_COMMUNITY): Payer: Medicare Other

## 2021-02-09 ENCOUNTER — Encounter: Payer: Medicare Other | Admitting: Student

## 2021-03-23 ENCOUNTER — Encounter: Payer: Self-pay | Admitting: *Deleted

## 2021-05-10 ENCOUNTER — Telehealth: Payer: Self-pay

## 2021-05-10 NOTE — Telephone Encounter (Signed)
Lvm for pt to call back.   We have a mobile mammo even and wanted to schedule pt.   RE: if pt calls PCP office this note can be forwarded to me for follow up.

## 2021-06-15 ENCOUNTER — Encounter: Payer: Self-pay | Admitting: Internal Medicine

## 2021-06-15 ENCOUNTER — Ambulatory Visit (INDEPENDENT_AMBULATORY_CARE_PROVIDER_SITE_OTHER): Payer: Medicare Other | Admitting: Internal Medicine

## 2021-06-15 VITALS — BP 138/86 | HR 60 | Temp 98.5°F | Wt 178.0 lb

## 2021-06-15 DIAGNOSIS — I2510 Atherosclerotic heart disease of native coronary artery without angina pectoris: Secondary | ICD-10-CM | POA: Diagnosis not present

## 2021-06-15 DIAGNOSIS — Z720 Tobacco use: Secondary | ICD-10-CM

## 2021-06-15 DIAGNOSIS — I10 Essential (primary) hypertension: Secondary | ICD-10-CM

## 2021-06-15 DIAGNOSIS — Z0000 Encounter for general adult medical examination without abnormal findings: Secondary | ICD-10-CM | POA: Diagnosis not present

## 2021-06-15 DIAGNOSIS — G44001 Cluster headache syndrome, unspecified, intractable: Secondary | ICD-10-CM | POA: Diagnosis not present

## 2021-06-15 DIAGNOSIS — Z1231 Encounter for screening mammogram for malignant neoplasm of breast: Secondary | ICD-10-CM | POA: Diagnosis not present

## 2021-06-15 DIAGNOSIS — Z1211 Encounter for screening for malignant neoplasm of colon: Secondary | ICD-10-CM

## 2021-06-15 DIAGNOSIS — G801 Spastic diplegic cerebral palsy: Secondary | ICD-10-CM | POA: Diagnosis not present

## 2021-06-15 DIAGNOSIS — K219 Gastro-esophageal reflux disease without esophagitis: Secondary | ICD-10-CM | POA: Diagnosis not present

## 2021-06-15 MED ORDER — ROSUVASTATIN CALCIUM 20 MG PO TABS: 20.0000 mg | ORAL_TABLET | Freq: Every day | ORAL | 2 refills | Status: DC

## 2021-06-15 MED ORDER — PANTOPRAZOLE SODIUM 40 MG PO TBEC: DELAYED_RELEASE_TABLET | ORAL | 3 refills | Status: DC

## 2021-06-15 MED ORDER — METOPROLOL SUCCINATE ER 25 MG PO TB24: ORAL_TABLET | ORAL | 3 refills | Status: DC

## 2021-06-15 MED ORDER — TRAMADOL HCL 50 MG PO TABS: 50.0000 mg | ORAL_TABLET | Freq: Two times a day (BID) | ORAL | 2 refills | Status: DC

## 2021-06-15 NOTE — Progress Notes (Signed)
   CC: meet new pcp/med refill  HPI:  [name] is a 55 y.o. with HTN, HLD, CAD, bradycardia, GERD, cerebral palsy, and prediabetes who presents to the [facility] to meet her new Pcp and for medication refills. Please see problem-based list for further details, assessments, and plans.   Past Medical History:  Diagnosis Date   Bipolar disorder (HCC)     Followed by mental health, who prescribe her bipolar meds   Bradycardia 06/20/2011   Breast abscess 05/07/2015   Cerebral palsy (HCC)     with history of neuropathic pain  and spasticity,  requiring long-term pain medications   Chest pain at rest 10/03/2013   Coronary artery disease 02/2008   a. s/p INF STEMI 5/09 tx with BMS to CFX;  b. Lexiscan Myoview 12/10 -  low risk, no ischemia.;   c. s/p NSTEMI 8/12: cath with LM 10%, LAD lum. irregs., oOM1 40%, mOM2 40%, mCFX stent occluded, RCA 50%, EF 45-50%; PCI - s/p DES to mCFX  d. Patent LCx stent, diffuse disease up to 50% in small nondominant RCA, 80% ostial stenosis in small OM3.    Frontal headache 11/21/2011   GERD (gastroesophageal reflux disease)    History of pelvic mass    Found on CT abd/ pelvis (04/2008) - 13.3 cm cystic pelvis mass, possibly left ovarian origin. To be followed at [facility].    HLD (hyperlipidemia)    Hypertension    Prediabetes 11/21/2011   Vitamin D deficiency 11/2009    patient treated with high-dose vitamin D weekly x3 months,  levels were rechecked and it was determined that the patient continued to require high dose repletion of vitamin D.   Review of Systems:  Review of Systems  Constitutional:  Negative for chills and fever.  HENT: Negative.    Eyes: Negative.   Respiratory:  Negative for shortness of breath and wheezing.   Cardiovascular:  Negative for chest pain, palpitations and leg swelling.  Gastrointestinal:  Negative for diarrhea, heartburn, nausea and vomiting.  Genitourinary: Negative.   Musculoskeletal:  Positive for joint pain.  Neurological:   Positive for weakness. Negative for dizziness and headaches.  Psychiatric/Behavioral: Negative.      Physical Exam:  Vitals:   06/15/21 0933  BP: 138/86  Pulse: 60  Temp: 98.5 F (36.9 C)  TempSrc: Oral  SpO2: 100%  Weight: 178 lb (80.7 kg)   General: No acute distress. CV: RRR. No murmurs, rubs, or gallops. No LE edema Pulmonary: Lungs CTAB. Normal effort. No wheezing or rales. Abdominal: Soft, nontender, nondistended. Normal bowel sounds. Extremities: Palpable radial and DP pulses. Limited ROM of R hip. Skin: Warm and dry. No obvious rash or lesions. Neuro: A&Ox3. Moves all extremities, although has difficulty ambulating due to cerebral palsy. Normal sensation. Wheelchair bound, uses walker in home. Psych: Normal mood and affect   Assessment & Plan:   See Encounters Tab for problem based charting.  Patient seen with Dr. Criselda Peaches

## 2021-06-15 NOTE — Assessment & Plan Note (Signed)
Smoking 1/2 a pack per day still. Discussed smoking cessation with the patient at this visit, however, she is opposed to this, as whenever she tries to reduce smoking, she gains weight and does not want to gain any weight at this time. Will discuss smoking cessation again at next visit.

## 2021-06-15 NOTE — Assessment & Plan Note (Signed)
Symptoms have been stable, although are worse with spicy foods and foods containing citrus. Patient also believes she is developing lactose intolerance.  Plan: - refill protonix 40mg  daily - advised to take over the counter lactase as needed

## 2021-06-15 NOTE — Assessment & Plan Note (Signed)
Patient declined COVID and flu vaccine at this time. Will consider the flu vaccine, however, she is opposed to the COVID vaccine.  Mammogram and colonoscopy referrals sent.   Will check CBC, BMP, and lipid panel at next visit, as patient has been off of her medications

## 2021-06-15 NOTE — Assessment & Plan Note (Signed)
Patient continues to endorse right sided hip pain secondary to her cerebral palsy. She is able to ambulate around her house with a walker, however, she has pain with doing so. She continues to use a wheelchair whenever she leaves her home. She has had 2 previous falls since her last visit to the [facility], but she did not seek medical care at those times. She states that she fell because the spasticity in her lower extremities is worsening and sometimes she has a hard time getting her walker into her bathroom due to the limited door space. Patient was previously taking Tramadol 50mg  for pain, however, she required this medicine twice a day to get significant relief. Patient also is requesting a letter to her insurance company for necessary home modifications to help her with her ADLs.   Plan: - Updated pain contract - Refilled Tramadol, 50mg  BID x3 months sent - Provided patient with letter for shower bar, shower chair, and other necessary home modifications

## 2021-06-15 NOTE — Patient Instructions (Signed)
Thank you, [name] for allowing [place] to provide your care today. Today we discussed: Cerebral palsy: I have refilled your Tramadol prescription an have sent a letter for the necessary home modifications we talked about High blood pressure: Refilled your metoprolol, will recheck labs and your BP at next visit High cholesterol: Refilled crestor 20mg   Migraines: I am glad your migraines are improved with a decrease in your caffeine intake. We will hold off on refilling the verapamil as you no longer require this medication GERD: Will refill your protonix. You can take over the counter lactase supplements as needed for lactose intolerance Healthcare maintenance: Mammogram and colonoscopy referral sent. If you would like to get the flu shot at a later date, please call to let know or go to your local pharmacy to get this done.  I have ordered the following labs for you:  Lab Orders  No laboratory test(s) ordered today     Tests ordered today:  None  Referrals ordered today:    Referral Orders         Ambulatory referral to Gastroenterology      I have ordered the following medication/changed the following medications:   Stop the following medications: Medications Discontinued During This Encounter  Medication Reason   metoprolol succinate (TOPROL-XL) 25 MG 24 hr tablet Reorder   pantoprazole (PROTONIX) 40 MG tablet Reorder   rosuvastatin (CRESTOR) 20 MG tablet Reorder     Start the following medications: Meds ordered this encounter  Medications   traMADol (ULTRAM) 50 MG tablet    Sig: Take 1 tablet (50 mg total) by mouth 2 (two) times daily.    Dispense:  60 tablet    Refill:  2   rosuvastatin (CRESTOR) 20 MG tablet    Sig: Take 1 tablet (20 mg total) by mouth at bedtime.    Dispense:  90 tablet    Refill:  2   metoprolol succinate (TOPROL-XL) 25 MG 24 hr tablet    Sig: TAKE 1/2 TABLET(12.5 MG) BY MOUTH DAILY    Dispense:  45 tablet    Refill:  3    pantoprazole (PROTONIX) 40 MG tablet    Sig: TAKE 1 TABLET(40 MG) BY MOUTH DAILY    Dispense:  90 tablet    Refill:  3     Follow up: 6 months     Should you have any questions or concerns please call the internal medicine clinic at [phone].     [phone], D.O. [facility]

## 2021-06-15 NOTE — Assessment & Plan Note (Signed)
Patient has been taking aspirin 81mg  over the counter. She has been prescribed Rosuvastatin 20mg  and Toprol 12.5mg  daily, however, she has not taken these medications for ~6 months as she has had no refills. No recent episodes of chest pain, palpitations, or any other sxs. Stressed the importance of taking these medications everyday. Also discussed smoking cessation with the patient, however, she is not interested in this at this time. She currently smokes a 1/2 pack a day.  Plan: - Continue ASA 81mg  - Refill rosuvastatin and toprol

## 2021-06-15 NOTE — Assessment & Plan Note (Signed)
Patient notes that since her last visit to the [facility] in September, she has not had any migraines. She has not been taking amitriptyline or verapamil, but she did significantly reduce her caffeine intake which has helped her symptoms. Will continue to monitor

## 2021-06-15 NOTE — Assessment & Plan Note (Signed)
BP 138/86 today. Patient has been off of her Toprol for ~6 months, as she has not had any refills of this medication. She denies any headaches, dizziness/lightheadedness, chest pain, palpitations, or any other sxs at this time.  Plan: - Refill Toprol 12.5mg  daily

## 2021-06-16 ENCOUNTER — Telehealth: Payer: Self-pay

## 2021-06-16 NOTE — Progress Notes (Signed)
Internal Medicine Clinic Attending ° °I saw and evaluated the patient.  I personally confirmed the key portions of the history and exam documented by Dr. Atway and I reviewed pertinent patient test results.  The assessment, diagnosis, and plan were formulated together and I agree with the documentation in the resident’s note.  °

## 2021-06-16 NOTE — Telephone Encounter (Signed)
PA was sent off through cover my meds  for PT Tramadol  awaiting approval or denial

## 2021-06-17 NOTE — Telephone Encounter (Signed)
PA decision :  Approved today  Request Reference Number: [id number]. TRAMADOL HCL TAB 50MG  is approved through 07/16/2021. Your patient may now fill this prescription and it will be covered.

## 2021-10-11 ENCOUNTER — Encounter: Payer: Self-pay | Admitting: Internal Medicine

## 2022-01-04 ENCOUNTER — Telehealth: Payer: Self-pay

## 2022-01-04 NOTE — Telephone Encounter (Signed)
Patient contacted to schedule mammogram.  ? ?RE: Mobile Mammo event located at: ? ?[email or web address]  ?      ?[address]    ?[place] [address]    ? ?Date: April 7th  ? ?Patient will call back after securing transportation.  ? ?

## 2022-01-11 NOTE — Telephone Encounter (Signed)
Reminder call of upcoming appt    ?      ?RE: Mobile Mammo event located at:   ?      ? [facility]  ?      ?[address]    ?[place] [address]    ?      ?Lvm informing of same    ? ?

## 2022-01-13 ENCOUNTER — Inpatient Hospital Stay: Admission: RE | Admit: 2022-01-13 | Payer: Medicare Other | Source: Ambulatory Visit

## 2022-02-20 ENCOUNTER — Ambulatory Visit (INDEPENDENT_AMBULATORY_CARE_PROVIDER_SITE_OTHER): Payer: Medicare Other | Admitting: Internal Medicine

## 2022-02-20 ENCOUNTER — Encounter: Payer: Self-pay | Admitting: Internal Medicine

## 2022-02-20 ENCOUNTER — Other Ambulatory Visit: Payer: Self-pay

## 2022-02-20 VITALS — BP 131/72 | HR 60 | Temp 98.3°F | Resp 24 | Ht 65.0 in

## 2022-02-20 DIAGNOSIS — K219 Gastro-esophageal reflux disease without esophagitis: Secondary | ICD-10-CM

## 2022-02-20 DIAGNOSIS — E785 Hyperlipidemia, unspecified: Secondary | ICD-10-CM

## 2022-02-20 DIAGNOSIS — Z0000 Encounter for general adult medical examination without abnormal findings: Secondary | ICD-10-CM

## 2022-02-20 DIAGNOSIS — F17210 Nicotine dependence, cigarettes, uncomplicated: Secondary | ICD-10-CM | POA: Diagnosis not present

## 2022-02-20 DIAGNOSIS — I10 Essential (primary) hypertension: Secondary | ICD-10-CM

## 2022-02-20 DIAGNOSIS — Z720 Tobacco use: Secondary | ICD-10-CM

## 2022-02-20 DIAGNOSIS — I2510 Atherosclerotic heart disease of native coronary artery without angina pectoris: Secondary | ICD-10-CM

## 2022-02-20 DIAGNOSIS — Z1211 Encounter for screening for malignant neoplasm of colon: Secondary | ICD-10-CM

## 2022-02-20 MED ORDER — ROSUVASTATIN CALCIUM 20 MG PO TABS: 20.0000 mg | ORAL_TABLET | Freq: Every day | ORAL | 0 refills | Status: DC

## 2022-02-20 MED ORDER — METOPROLOL SUCCINATE ER 25 MG PO TB24: ORAL_TABLET | ORAL | 3 refills | Status: DC

## 2022-02-20 MED ORDER — PANTOPRAZOLE SODIUM 40 MG PO TBEC: DELAYED_RELEASE_TABLET | ORAL | 3 refills | Status: AC

## 2022-02-20 NOTE — Assessment & Plan Note (Addendum)
Status post STEMI in 2009.  Patient was seen by [facility] cardiology.  Patient was started on aspirin 81 mg daily, rosuvastatin 20 mg daily and Toprol 12.5 mg daily.  She has not seen cardiology in many years and has been without medications for over 6 months.  She denies chest pain, palpitations, dyspnea on exertion, chest pain on exertion, or any other symptoms at this time. ? ? ?P: ?-Resume aspirin 81 mg daily, rosuvastatin 20 mg daily, Toprol 12.5 mg daily ?-Follow-up lipid panel ?-Follow-up BMP ?

## 2022-02-20 NOTE — Assessment & Plan Note (Signed)
Patient is due for colonoscopy, however is not agreeable to bowel prep.  As she has cerebral palsy and has difficulty with ambulation and is concerned that she will not get to the bathroom quickly enough.  She does not want to injure herself.  She is agreeable to FOBT and was informed that if results of the FOBT is positive she will still need to pursue colonoscopy.  If negative FOBT will be obtained yearly. ? ? ?P: ?-FOBT ordered  ?

## 2022-02-20 NOTE — Patient Instructions (Signed)
Thank you, Ms.Anahis Furgeson Waymire for allowing [place] to provide your care today. ? ?I have ordered the following labs for you: ? ?Lab Orders    ?     Fecal occult blood, imunochemical    ?     Lipid Profile    ?     BMP8+Anion Gap     ? ?Tests ordered today: ? ?FOBT ? ?Referrals ordered today:  ? ?Referral Orders  ?No referral(s) requested today  ?  ? ?I have ordered the following medication/changed the following medications:  ? ?Start the following medications: ?Meds ordered this encounter  ?Medications  ? rosuvastatin (CRESTOR) 20 MG tablet  ?  Sig: Take 1 tablet (20 mg total) by mouth at bedtime.  ?  Dispense:  90 tablet  ?  Refill:  0  ? pantoprazole (PROTONIX) 40 MG tablet  ?  Sig: TAKE 1 TABLET(40 MG) BY MOUTH DAILY  ?  Dispense:  90 tablet  ?  Refill:  3  ? metoprolol succinate (TOPROL-XL) 25 MG 24 hr tablet  ?  Sig: TAKE 1/2 TABLET(12.5 MG) BY MOUTH DAILY  ?  Dispense:  45 tablet  ?  Refill:  3  ?  ? ?Follow up: 3 months  ? ?Remember: Continue taking your medications as directed ? ?Should you have any questions or concerns please call the internal medicine clinic at [phone].   ? ?Dellis Filbert, MD ?[facility] ?  ?

## 2022-02-20 NOTE — Progress Notes (Signed)
? ?CC: Blood pressure check, cholesterol check, routine follow-up, medication refill ? ?HPI: ? ?[name] is a 56 y.o. female with a past medical history stated below and presents today for CC listed above. Please see problem based assessment and plan for additional details. ? ?Past Medical History:  ?Diagnosis Date  ? Bipolar disorder (HCC)   ?  Followed by mental health, who prescribe her bipolar meds  ? Bradycardia 06/20/2011  ? Breast abscess 05/07/2015  ? Cerebral palsy (HCC)   ?  with history of neuropathic pain  and spasticity,  requiring long-term pain medications  ? Chest pain at rest 10/03/2013  ? Coronary artery disease 02/2008  ? a. s/p INF STEMI 5/09 tx with BMS to CFX;  b. Lexiscan Myoview 12/10 -  low risk, no ischemia.;   c. s/p NSTEMI 8/12: cath with LM 10%, LAD lum. irregs., oOM1 40%, mOM2 40%, mCFX stent occluded, RCA 50%, EF 45-50%; PCI - s/p DES to mCFX  d. Patent LCx stent, diffuse disease up to 50% in small nondominant RCA, 80% ostial stenosis in small OM3.   ? Frontal headache 11/21/2011  ? GERD (gastroesophageal reflux disease)   ? History of pelvic mass   ? Found on CT abd/ pelvis (04/2008) - 13.3 cm cystic pelvis mass, possibly left ovarian origin. To be followed at [facility].   ? HLD (hyperlipidemia)   ? Hypertension   ? Prediabetes 11/21/2011  ? Vitamin D deficiency 11/2009  ?  patient treated with high-dose vitamin D weekly x3 months,  levels were rechecked and it was determined that the patient continued to require high dose repletion of vitamin D.  ? ? ?Current Outpatient Medications on File Prior to Visit  ?Medication Sig Dispense Refill  ? albuterol (PROVENTIL HFA;VENTOLIN HFA) 108 (90 Base) MCG/ACT inhaler Inhale 2 puffs into the lungs every 6 (six) hours as needed for wheezing or shortness of breath. 1 Inhaler 0  ? aspirin 81 MG tablet Take 81 mg by mouth daily.      ? nitroGLYCERIN (NITROSTAT) 0.4 MG SL tablet Place 1 tablet (0.4 mg total) under the tongue every 5 (five)  minutes as needed for chest pain. 10 tablet 0  ? ?No current facility-administered medications on file prior to visit.  ? ? ?Family History  ?Problem Relation Age of Onset  ? Lung disease Mother 8  ?     Died of respiratory failure  ? Heart attack Father 55  ? ? ?Social History  ? ?Socioeconomic History  ? Marital status: Divorced  ?  Spouse name: Not on file  ? Number of children: Not on file  ? Years of education: Not on file  ? Highest education level: Not on file  ?Occupational History  ? Not on file  ?Tobacco Use  ? Smoking status: Every Day  ?  Packs/day: 0.50  ?  Years: 25.00  ?  Pack years: 12.50  ?  Types: Cigarettes  ? Smokeless tobacco: Never  ? Tobacco comments:  ?  Sometimes less. 1/2PPS  ?Substance and Sexual Activity  ? Alcohol use: Yes  ?  Alcohol/week: 0.0 standard drinks  ?  Comment: Rarely.  ? Drug use: No  ? Sexual activity: Not on file  ?Other Topics Concern  ? Not on file  ?Social History Narrative  ? Not on file  ? ?Social Determinants of Health  ? ?Financial Resource Strain: Not on file  ?Food Insecurity: Not on file  ?Transportation Needs: Not on file  ?Physical  Activity: Not on file  ?Stress: Not on file  ?Social Connections: Not on file  ?Intimate Partner Violence: Not on file  ? ? ?Review of Systems: ?ROS negative except for what is noted on the assessment and plan. ? ?Vitals:  ? 02/20/22 0912  ?BP: 131/72  ?Pulse: 60  ?Resp: (!) 24  ?Temp: 98.3 ?F (36.8 ?C)  ?TempSrc: Oral  ?SpO2: 100%  ?Height: 5\' 5"  (1.651 m)  ? ? ? ?Physical Exam: ?Constitutional: Normal-appearing woman in a wheelchair, in no acute distress ?HENT: normocephalic atraumatic, mucous membranes moist ?Eyes: conjunctiva non-erythematous ?Neck: supple ?Cardiovascular: regular rate and rhythm, no m/r/g ?Pulmonary/Chest: normal work of breathing on room air, lungs clear to auscultation bilaterally ?Abdominal: soft, non-tender, non-distended ?MSK: normal bulk and tone of upper extremities; atrophy noted of bilateral lower  extremities with trace edema. ?Neurological: alert & oriented x 3,  ?Skin: warm and dry ?Psych: Normal behavior, response to questions appropriately ? ? ?Assessment & Plan:  ? ?See Encounters Tab for problem based charting. ? ?Patient discussed with Dr. ?Lafonda Mosses, M.D. ?[facility] Internal Medicine, PGY-1 ?Pager: [phone], Phone: [phone] ?Date 02/20/2022 Time 11:44 AM ? ?

## 2022-02-20 NOTE — Assessment & Plan Note (Addendum)
Patient is a current smoker endorsing half a pack of cigarettes per day for the last 40 years.  Following her STEMI in 2009 patient was advised to quit smoking.  She however continues to smoke.  She was counseled extensively on the risk and consequences of smoking.  She acknowledges and agrees.  She is in the precontemplation stage at this time. ? ? ?P: ?-Continue to encourage smoke cessation ?-Discuss quitting again on follow-up visit ?

## 2022-02-20 NOTE — Assessment & Plan Note (Signed)
Denies any active symptoms of GERD currently.  Endorses reflux occurs intermittently and Protonix helps relieve her symptoms. ? ? ?P: ?-Refill pantoprazole 40 mg ?

## 2022-02-20 NOTE — Progress Notes (Signed)
Internal Medicine Clinic Attending ? ?Case discussed with Dr. Ariwodo  At the time of the visit.  We reviewed the resident?s history and exam and pertinent patient test results.  I agree with the assessment, diagnosis, and plan of care documented in the resident?s note.  ?

## 2022-02-20 NOTE — Assessment & Plan Note (Addendum)
Blood pressure well controlled 131/72. She denies any headaches, dizziness/lightheadedness, chest pain, palpitation or any other symptoms at this time.  She has been without medications for quite some time, she states.   ? ?P: ?-Resume Toprol 12.5 mg daily ?

## 2022-02-21 LAB — LIPID PANEL
Chol/HDL Ratio: 4.4 ratio (ref 0.0–4.4)
Cholesterol, Total: 224 mg/dL — ABNORMAL HIGH (ref 100–199)
HDL: 51 mg/dL (ref >39)
LDL Chol Calc (NIH): 161 mg/dL — ABNORMAL HIGH (ref 0–99)
Triglycerides: 70 mg/dL (ref 0–149)
VLDL Cholesterol Cal: 12 mg/dL (ref 5–40)

## 2022-02-21 LAB — BMP8+ANION GAP
Anion Gap: 14 mmol/L (ref 10.0–18.0)
BUN/Creatinine Ratio: 20 (ref 9–23)
BUN: 14 mg/dL (ref 6–24)
CO2: 22 mmol/L (ref 20–29)
Calcium: 10 mg/dL (ref 8.7–10.2)
Chloride: 104 mmol/L (ref 96–106)
Creatinine, Ser: 0.71 mg/dL (ref 0.57–1.00)
Glucose: 87 mg/dL (ref 70–99)
Potassium: 4.6 mmol/L (ref 3.5–5.2)
Sodium: 140 mmol/L (ref 134–144)
eGFR: 99 mL/min/{1.73_m2} (ref >59)

## 2022-04-14 ENCOUNTER — Ambulatory Visit (INDEPENDENT_AMBULATORY_CARE_PROVIDER_SITE_OTHER): Payer: Medicare Other | Admitting: Student

## 2022-04-14 ENCOUNTER — Encounter: Payer: Self-pay | Admitting: Student

## 2022-04-14 DIAGNOSIS — G801 Spastic diplegic cerebral palsy: Secondary | ICD-10-CM

## 2022-04-14 DIAGNOSIS — Z0000 Encounter for general adult medical examination without abnormal findings: Secondary | ICD-10-CM

## 2022-04-14 DIAGNOSIS — G809 Cerebral palsy, unspecified: Secondary | ICD-10-CM | POA: Diagnosis not present

## 2022-04-14 DIAGNOSIS — Z1231 Encounter for screening mammogram for malignant neoplasm of breast: Secondary | ICD-10-CM

## 2022-04-14 DIAGNOSIS — R52 Pain, unspecified: Secondary | ICD-10-CM

## 2022-04-14 DIAGNOSIS — G8929 Other chronic pain: Secondary | ICD-10-CM | POA: Diagnosis not present

## 2022-04-14 MED ORDER — TRAMADOL HCL 50 MG PO TABS: 50.0000 mg | ORAL_TABLET | ORAL | 2 refills | Status: DC

## 2022-04-14 NOTE — Assessment & Plan Note (Signed)
Her last mammogram was 2017.  She states that she has been canceling her recent mammogram appointment because she has to babysit her grandchildren.  She is motivated to get it now.  She can use public transportation to her appointment.  -Mammogram ordered

## 2022-04-14 NOTE — Assessment & Plan Note (Addendum)
Patient presented to the clinic for a new wheelchair DME.  She has cerebral palsy and has been using a wheelchair since 2012.  She had this wheelchair for about 10 years but one of her wheels was damaged recently.  She could not bring the wheelchair to [facility] to get it repaired due to transportation issue.  She would like to have a new manual wheelchair with leg cushion.    She uses her walker inside the house and wheelchair outside the house.  The wheelchair helps her to get to public transportation and to doctors appointments.  Regarding her tramadol, she signed a pain contract with Dr. Virgie Dad in September 2022 for 50 mg 2 tablets daily.  She received her first refill in 06/2021.  Unfortunately her insurance only cover 15 tablets a month after that.  She did not pick up any additional refill.  She has been taking Tylenol 650 mg 4-5 tablets a day.  Patient agrees with trying tramadol 50 mg 15 tablets every 30 days for now.  I advised patient to cut back on Tylenol use.  -Wheelchair DME order -Tramadol 50 mg 15 tablets / 30 days x 2 refills

## 2022-04-14 NOTE — Progress Notes (Signed)
Internal Medicine Clinic Attending  Case discussed with Dr. Nguyen  At the time of the visit.  We reviewed the resident's history and exam and pertinent patient test results.  I agree with the assessment, diagnosis, and plan of care documented in the resident's note. 

## 2022-04-14 NOTE — Progress Notes (Signed)
[facility] Internal Medicine Residency Telephone Encounter Continuity Care Appointment  HPI:  This telephone encounter was created for Ms. Mkenzie Dotts Selover on 04/14/2022 for the following purpose/cc order for new wheelchair.   Past Medical History:  Past Medical History:  Diagnosis Date   Bipolar disorder (HCC)     Followed by mental health, who prescribe her bipolar meds   Bradycardia 06/20/2011   Breast abscess 05/07/2015   Cerebral palsy (HCC)     with history of neuropathic pain  and spasticity,  requiring long-term pain medications   Chest pain at rest 10/03/2013   Coronary artery disease 02/2008   a. s/p INF STEMI 5/09 tx with BMS to CFX;  b. Lexiscan Myoview 12/10 -  low risk, no ischemia.;   c. s/p NSTEMI 8/12: cath with LM 10%, LAD lum. irregs., oOM1 40%, mOM2 40%, mCFX stent occluded, RCA 50%, EF 45-50%; PCI - s/p DES to mCFX  d. Patent LCx stent, diffuse disease up to 50% in small nondominant RCA, 80% ostial stenosis in small OM3.    Frontal headache 11/21/2011   GERD (gastroesophageal reflux disease)    History of pelvic mass    Found on CT abd/ pelvis (04/2008) - 13.3 cm cystic pelvis mass, possibly left ovarian origin. To be followed at [facility].    HLD (hyperlipidemia)    Hypertension    Prediabetes 11/21/2011   Vitamin D deficiency 11/2009    patient treated with high-dose vitamin D weekly x3 months,  levels were rechecked and it was determined that the patient continued to require high dose repletion of vitamin D.     ROS:  Per HPI   Assessment / Plan / Recommendations:  Please see A&P under problem oriented charting for assessment of the patient's acute and chronic medical conditions.  As always, pt is advised that if symptoms worsen or new symptoms arise, they should go to an urgent care facility or to to ER for further evaluation.   Consent and Medical Decision Making:  Patient discussed with Dr.  Lafonda Mosses This is a telephone encounter between [name] and  Doran Stabler on 04/14/2022 for new wheelchair ordered. The visit was conducted with the patient located at home and Doran Stabler at [facility]. The patient's identity was confirmed using their DOB and current address. The patient has consented to being evaluated through a telephone encounter and understands the associated risks (an examination cannot be done and the patient may need to come in for an appointment) / benefits (allows the patient to remain at home, decreasing exposure to coronavirus). I personally spent 16 minutes on medical discussion.   Cerebral palsy [facility]) Patient presented to the clinic for a new wheelchair DME.  She has cerebral palsy and has been using a wheelchair since 2012.  She had this wheelchair for about 10 years but one of her wheels was damaged recently.  She could not bring the wheelchair to [facility] to get it repaired due to transportation issue.  She would like to have a new manual wheelchair with leg cushion.    She uses her walker inside the house and wheelchair outside the house.  The wheelchair helps her to get to public transportation and to doctors appointments.  Regarding her tramadol, she signed a pain contract with Dr. Virgie Dad in September 2022 for 50 mg 2 tablets daily.  She received her first refill in 06/2021.  Unfortunately her insurance only cover 15 tablets a month after that.  She did not  pick up any additional refill.  She has been taking Tylenol 650 mg 4-5 tablets a day.  Patient agrees with trying tramadol 50 mg 15 tablets every 30 days for now.  I advised patient to cut back on Tylenol use.  -Wheelchair DME order -Tramadol 50 mg 15 tablets / 30 days x 2 refills  Healthcare maintenance Her last mammogram was 2017.  She states that she has been canceling her recent mammogram appointment because she has to babysit her grandchildren.  She is motivated to get it now.  She can use public transportation to her appointment.  -Mammogram ordered

## 2022-05-22 ENCOUNTER — Other Ambulatory Visit: Payer: Self-pay | Admitting: Internal Medicine

## 2022-05-22 DIAGNOSIS — I2510 Atherosclerotic heart disease of native coronary artery without angina pectoris: Secondary | ICD-10-CM

## 2022-08-28 ENCOUNTER — Ambulatory Visit: Payer: Medicare Other

## 2022-08-28 ENCOUNTER — Encounter: Payer: Medicare Other | Admitting: Internal Medicine

## 2022-08-28 NOTE — Progress Notes (Deleted)
CC: ***  HPI:  [name] is a 56 y.o. female living with a history stated below and presents today for ***. Please see problem based assessment and plan for additional details.  Past Medical History:  Diagnosis Date   Bipolar disorder (HCC)     Followed by mental health, who prescribe her bipolar meds   Bradycardia 06/20/2011   Breast abscess 05/07/2015   Cerebral palsy (HCC)     with history of neuropathic pain  and spasticity,  requiring long-term pain medications   Chest pain at rest 10/03/2013   Coronary artery disease 02/2008   a. s/p INF STEMI 5/09 tx with BMS to CFX;  b. Lexiscan Myoview 12/10 -  low risk, no ischemia.;   c. s/p NSTEMI 8/12: cath with LM 10%, LAD lum. irregs., oOM1 40%, mOM2 40%, mCFX stent occluded, RCA 50%, EF 45-50%; PCI - s/p DES to mCFX  d. Patent LCx stent, diffuse disease up to 50% in small nondominant RCA, 80% ostial stenosis in small OM3.    Frontal headache 11/21/2011   GERD (gastroesophageal reflux disease)    History of pelvic mass    Found on CT abd/ pelvis (04/2008) - 13.3 cm cystic pelvis mass, possibly left ovarian origin. To be followed at [facility].    HLD (hyperlipidemia)    Hypertension    Prediabetes 11/21/2011   Vitamin D deficiency 11/2009    patient treated with high-dose vitamin D weekly x3 months,  levels were rechecked and it was determined that the patient continued to require high dose repletion of vitamin D.    Current Outpatient Medications on File Prior to Visit  Medication Sig Dispense Refill   albuterol (PROVENTIL HFA;VENTOLIN HFA) 108 (90 Base) MCG/ACT inhaler Inhale 2 puffs into the lungs every 6 (six) hours as needed for wheezing or shortness of breath. 1 Inhaler 0   aspirin 81 MG tablet Take 81 mg by mouth daily.       metoprolol succinate (TOPROL-XL) 25 MG 24 hr tablet TAKE 1/2 TABLET(12.5 MG) BY MOUTH DAILY 45 tablet 3   nitroGLYCERIN (NITROSTAT) 0.4 MG SL tablet Place 1 tablet (0.4 mg total) under the tongue every  5 (five) minutes as needed for chest pain. 10 tablet 0   pantoprazole (PROTONIX) 40 MG tablet TAKE 1 TABLET(40 MG) BY MOUTH DAILY 90 tablet 3   rosuvastatin (CRESTOR) 20 MG tablet TAKE 1 TABLET(20 MG) BY MOUTH AT BEDTIME 90 tablet 3   No current facility-administered medications on file prior to visit.    Family History  Problem Relation Age of Onset   Lung disease Mother 50       Died of respiratory failure   Heart attack Father 36    Social History   Socioeconomic History   Marital status: Divorced    Spouse name: Not on file   Number of children: Not on file   Years of education: Not on file   Highest education level: Not on file  Occupational History   Not on file  Tobacco Use   Smoking status: Every Day    Packs/day: 0.50    Years: 25.00    Total pack years: 12.50    Types: Cigarettes   Smokeless tobacco: Never   Tobacco comments:    Sometimes less. 1/2PPS  Substance and Sexual Activity   Alcohol use: Yes    Alcohol/week: 0.0 standard drinks of alcohol    Comment: Rarely.   Drug use: No   Sexual activity: Not on  file  Other Topics Concern   Not on file  Social History Narrative   Not on file   Social Determinants of Health   Financial Resource Strain: Not on file  Food Insecurity: Not on file  Transportation Needs: Not on file  Physical Activity: Not on file  Stress: Not on file  Social Connections: Not on file  Intimate Partner Violence: Not on file    Review of Systems: ROS negative except for what is noted on the assessment and plan.  There were no vitals filed for this visit.  Physical Exam: Constitutional: well-appearing *** sitting in ***, in no acute distress HENT: normocephalic atraumatic, mucous membranes moist Eyes: conjunctiva non-erythematous Cardiovascular: regular rate and rhythm, no m/r/g Pulmonary/Chest: normal work of breathing on room air, lungs clear to auscultation bilaterally Abdominal: soft, non-tender, non-distended MSK:  normal bulk and tone Neurological: alert & oriented x 3, no focal deficit Skin: warm and dry Psych: normal mood and behavior  Assessment & Plan:     Patient {GC/GE:3044014::"discussed with","seen with"} Dr. {IPJAS:[id number]::"BHALPFXT","K. Hoffman","Mullen","Narendra","Vincent","Guilloud","Lau","Machen"}  No problem-specific Assessment & Plan notes found for this encounter.   [name], D.O. [facility] Internal Medicine, PGY-2 Phone: [phone] Date 08/28/2022 Time 8:26 AM

## 2022-09-18 ENCOUNTER — Telehealth: Payer: Self-pay | Admitting: Internal Medicine

## 2022-09-18 ENCOUNTER — Encounter: Payer: Medicare Other | Admitting: Internal Medicine

## 2022-09-18 NOTE — Telephone Encounter (Addendum)
   traMADol (ULTRAM) 50 MG tablet    Pt cancelled appointment on 08/28/2022 and today's appointment and resch her appointment again for the following:  Name: [name], [name] MRN: [id number]  Date: 09/28/2022 Status: Sch  Time: 2:15 PM Length: 30  Visit Type: OPEN ESTABLISHED [726] Copay: $0.00  Provider: Lyndle Herrlich, MD        [facility] [phone] - [place], [place] - [address] AT [facility] OF [address] (Ph: [phone])

## 2022-09-18 NOTE — Progress Notes (Deleted)
CC: ***  HPI:  [name] is a 56 y.o. female living with a history stated below and presents today for ***. Please see problem based assessment and plan for additional details.  Past Medical History:  Diagnosis Date   Bipolar disorder (HCC)     Followed by mental health, who prescribe her bipolar meds   Bradycardia 06/20/2011   Breast abscess 05/07/2015   Cerebral palsy (HCC)     with history of neuropathic pain  and spasticity,  requiring long-term pain medications   Chest pain at rest 10/03/2013   Coronary artery disease 02/2008   a. s/p INF STEMI 5/09 tx with BMS to CFX;  b. Lexiscan Myoview 12/10 -  low risk, no ischemia.;   c. s/p NSTEMI 8/12: cath with LM 10%, LAD lum. irregs., oOM1 40%, mOM2 40%, mCFX stent occluded, RCA 50%, EF 45-50%; PCI - s/p DES to mCFX  d. Patent LCx stent, diffuse disease up to 50% in small nondominant RCA, 80% ostial stenosis in small OM3.    Frontal headache 11/21/2011   GERD (gastroesophageal reflux disease)    History of pelvic mass    Found on CT abd/ pelvis (04/2008) - 13.3 cm cystic pelvis mass, possibly left ovarian origin. To be followed at [facility].    HLD (hyperlipidemia)    Hypertension    Prediabetes 11/21/2011   Vitamin D deficiency 11/2009    patient treated with high-dose vitamin D weekly x3 months,  levels were rechecked and it was determined that the patient continued to require high dose repletion of vitamin D.    Current Outpatient Medications on File Prior to Visit  Medication Sig Dispense Refill   albuterol (PROVENTIL HFA;VENTOLIN HFA) 108 (90 Base) MCG/ACT inhaler Inhale 2 puffs into the lungs every 6 (six) hours as needed for wheezing or shortness of breath. 1 Inhaler 0   aspirin 81 MG tablet Take 81 mg by mouth daily.       metoprolol succinate (TOPROL-XL) 25 MG 24 hr tablet TAKE 1/2 TABLET(12.5 MG) BY MOUTH DAILY 45 tablet 3   nitroGLYCERIN (NITROSTAT) 0.4 MG SL tablet Place 1 tablet (0.4 mg total) under the tongue every  5 (five) minutes as needed for chest pain. 10 tablet 0   pantoprazole (PROTONIX) 40 MG tablet TAKE 1 TABLET(40 MG) BY MOUTH DAILY 90 tablet 3   rosuvastatin (CRESTOR) 20 MG tablet TAKE 1 TABLET(20 MG) BY MOUTH AT BEDTIME 90 tablet 3   No current facility-administered medications on file prior to visit.    Family History  Problem Relation Age of Onset   Lung disease Mother 50       Died of respiratory failure   Heart attack Father 36    Social History   Socioeconomic History   Marital status: Divorced    Spouse name: Not on file   Number of children: Not on file   Years of education: Not on file   Highest education level: Not on file  Occupational History   Not on file  Tobacco Use   Smoking status: Every Day    Packs/day: 0.50    Years: 25.00    Total pack years: 12.50    Types: Cigarettes   Smokeless tobacco: Never   Tobacco comments:    Sometimes less. 1/2PPS  Substance and Sexual Activity   Alcohol use: Yes    Alcohol/week: 0.0 standard drinks of alcohol    Comment: Rarely.   Drug use: No   Sexual activity: Not on  file  Other Topics Concern   Not on file  Social History Narrative   Not on file   Social Determinants of Health   Financial Resource Strain: Not on file  Food Insecurity: Not on file  Transportation Needs: Not on file  Physical Activity: Not on file  Stress: Not on file  Social Connections: Not on file  Intimate Partner Violence: Not on file    Review of Systems: ROS negative except for what is noted on the assessment and plan.  There were no vitals filed for this visit.  Physical Exam: Constitutional: well-appearing *** sitting in ***, in no acute distress HENT: normocephalic atraumatic, mucous membranes moist Eyes: conjunctiva non-erythematous Cardiovascular: regular rate and rhythm, no m/r/g Pulmonary/Chest: normal work of breathing on room air, lungs clear to auscultation bilaterally Abdominal: soft, non-tender, non-distended MSK:  normal bulk and tone Neurological: alert & oriented x 3, no focal deficit Skin: warm and dry Psych: normal mood and behavior  Assessment & Plan:   CP: tramadol 50 mg 15 tabs per month.   HTN: toprol 12.5 mg daily  HCM: mammo, cscope, flu, shingrix, tdap  Patient {GC/GE:3044014::"discussed with","seen with"} Dr. {YHOOI:[id number]::"ASUORVIF","B. Hoffman","Mullen","Narendra","Vincent","Guilloud","Lau","Machen"}  No problem-specific Assessment & Plan notes found for this encounter.   [name], D.O. [facility] Internal Medicine, PGY-2 Phone: [phone] Date 09/18/2022 Time 7:41 AM

## 2022-09-28 ENCOUNTER — Ambulatory Visit (INDEPENDENT_AMBULATORY_CARE_PROVIDER_SITE_OTHER): Payer: Medicare Other

## 2022-09-28 VITALS — BP 121/89 | HR 66 | Temp 98.0°F | Wt 157.0 lb

## 2022-09-28 DIAGNOSIS — M25552 Pain in left hip: Secondary | ICD-10-CM | POA: Diagnosis not present

## 2022-09-28 DIAGNOSIS — Z131 Encounter for screening for diabetes mellitus: Secondary | ICD-10-CM | POA: Diagnosis not present

## 2022-09-28 DIAGNOSIS — G809 Cerebral palsy, unspecified: Secondary | ICD-10-CM | POA: Diagnosis not present

## 2022-09-28 DIAGNOSIS — E559 Vitamin D deficiency, unspecified: Secondary | ICD-10-CM | POA: Diagnosis not present

## 2022-09-28 DIAGNOSIS — M25551 Pain in right hip: Secondary | ICD-10-CM | POA: Diagnosis not present

## 2022-09-28 DIAGNOSIS — I2510 Atherosclerotic heart disease of native coronary artery without angina pectoris: Secondary | ICD-10-CM | POA: Diagnosis not present

## 2022-09-28 DIAGNOSIS — R6889 Other general symptoms and signs: Secondary | ICD-10-CM

## 2022-09-28 DIAGNOSIS — E785 Hyperlipidemia, unspecified: Secondary | ICD-10-CM | POA: Diagnosis not present

## 2022-09-28 MED ORDER — ULTRAM 50 MG PO TABS: 50.0000 mg | ORAL_TABLET | Freq: Every day | ORAL | 2 refills | Status: DC

## 2022-09-28 MED ORDER — ATORVASTATIN CALCIUM 40 MG PO TABS: 40.0000 mg | ORAL_TABLET | Freq: Every day | ORAL | 0 refills | Status: DC

## 2022-09-28 NOTE — Progress Notes (Addendum)
CC: Routine follow-up  HPI:  [name] is a 56 y.o.-year-old female with past medical history as below presenting for routine follow-up.  Please see encounters tab for problem-based charting.  Past Medical History:  Diagnosis Date   Bipolar disorder (HCC)     Followed by mental health, who prescribe her bipolar meds   Bradycardia 06/20/2011   Breast abscess 05/07/2015   Cerebral palsy (HCC)     with history of neuropathic pain  and spasticity,  requiring long-term pain medications   Chest pain at rest 10/03/2013   Coronary artery disease 02/2008   a. s/p INF STEMI 5/09 tx with BMS to CFX;  b. Lexiscan Myoview 12/10 -  low risk, no ischemia.;   c. s/p NSTEMI 8/12: cath with LM 10%, LAD lum. irregs., oOM1 40%, mOM2 40%, mCFX stent occluded, RCA 50%, EF 45-50%; PCI - s/p DES to mCFX  d. Patent LCx stent, diffuse disease up to 50% in small nondominant RCA, 80% ostial stenosis in small OM3.    Frontal headache 11/21/2011   GERD (gastroesophageal reflux disease)    History of pelvic mass    Found on CT abd/ pelvis (04/2008) - 13.3 cm cystic pelvis mass, possibly left ovarian origin. To be followed at [facility].    HLD (hyperlipidemia)    Hypertension    Prediabetes 11/21/2011   Vitamin D deficiency 11/2009    patient treated with high-dose vitamin D weekly x3 months,  levels were rechecked and it was determined that the patient continued to require high dose repletion of vitamin D.   Review of Systems: As in HPI.  Please see encounters tab for problem based charting.  Physical Exam:  Vitals:   09/28/22 1438  BP: 121/89  Pulse: 66  Temp: 98 F (36.7 C)  TempSrc: Oral  SpO2: 100%  Weight: 157 lb (71.2 kg)   General:Well-appearing, pleasant, In NAD Cardiac: RRR, no murmurs rubs or gallops. Respiratory: Normal work of breathing on room air, CTAB Abdominal: Soft, nontender, nondistended   Assessment & Plan:   Vitamin D deficiency Takes 1000 units of vit D everyday.  Will  check level today.   Addendum: vit D undetectable. Willl supplement 54270 units weekly for 8 weeks and put in future lab order for recheck. Will further assess for causes of malabsorption if it remains critically low  Hip pain Has persistent leg cramping and has tried muscle relaxants in the past without relief and with excessive sedation.  Ideally, she needs to be on tramadol at least once a day for relief.  After current dispense expires, will start new prescription of tramadol 50 mg once a day for relief.    Cerebral palsy (HCC) Working on pain management for spasms.  Does not seem to have clear contractures just occasional muscle spasms that can last some time.  See "hip pain" for full details.  Also filled out transportation assistance form.  She will need assistance with public transportation permanently.  Dyslipidemia Last lipid panel in May of this year showed LDL above goal of 70.  She is on Crestor 20 currently but mentions she is not able to tolerate this.  Has previously tried Lipitor with main concern being headaches.  We discussed possibilities for hyperlipidemia management given her CAD history and weight options of Lipitor, Crestor, PCSK9.  She does not want to start on an injection at this point and said when deciding between Lipitor and Crestor, she would wants to try Lipitor again.  Changed her  statin to Lipitor 40.  Goal is still less than 70.   Patient seen with Dr. Evette Doffing

## 2022-09-28 NOTE — Assessment & Plan Note (Addendum)
Takes 1000 units of vit D everyday.  Will check level today.   Addendum: vit D undetectable. Willl supplement 56389 units weekly for 8 weeks and put in future lab order for recheck. Will further assess for causes of malabsorption if it remains critically low

## 2022-09-28 NOTE — Assessment & Plan Note (Signed)
Last lipid panel in May of this year showed LDL above goal of 70.  She is on Crestor 20 currently but mentions she is not able to tolerate this.  Has previously tried Lipitor with main concern being headaches.  We discussed possibilities for hyperlipidemia management given her CAD history and weight options of Lipitor, Crestor, PCSK9.  She does not want to start on an injection at this point and said when deciding between Lipitor and Crestor, she would wants to try Lipitor again.  Changed her statin to Lipitor 40.  Goal is still less than 70.

## 2022-09-28 NOTE — Patient Instructions (Addendum)
[name], it was a pleasure seeing you today! You endorsed feeling well today. Below are some of the things we talked about this visit. We look forward to seeing you in the follow up appointment!  Today we discussed: Leg pain: After you run out of your current supply of tramadol, call us and we will refill a prescription for one pill a day for 30 days.  I am switching crestor to lipitor. Let us know if you are having side effects   Let us know if you have issues with the wheelchair  Keep taking your vitamin D supplements. I'll call you with lab results  I have ordered the following labs today:  Lab Orders  No laboratory test(s) ordered today      Referrals ordered today:   Referral Orders  No referral(s) requested today     I have ordered the following medication/changed the following medications:   Stop the following medications: Medications Discontinued During This Encounter  Medication Reason   rosuvastatin (CRESTOR) 20 MG tablet Discontinued by provider     Start the following medications: No orders of the defined types were placed in this encounter.    Follow-up: 3 months   Please make sure to arrive 15 minutes prior to your next appointment. If you arrive late, you may be asked to reschedule.   We look forward to seeing you next time. Please call our clinic at [phone] if you have any questions or concerns. The best time to call is Monday-Friday from 9am-4pm, but there is someone available 24/7. If after hours or the weekend, call the main hospital number and ask for the Internal Medicine Resident On-Call. If you need medication refills, please notify your pharmacy one week in advance and they will send [place] a request.  Thank you for letting us take part in your care. Wishing you the best!  Thank you, Lyndle Herrlich MD

## 2022-09-28 NOTE — Assessment & Plan Note (Addendum)
Has persistent leg cramping and has tried muscle relaxants in the past without relief and with excessive sedation.  Ideally, she needs to be on tramadol at least once a day for relief.  After current dispense expires, will start new prescription of tramadol 50 mg once a day for relief.

## 2022-09-28 NOTE — Assessment & Plan Note (Signed)
Working on pain management for spasms.  Does not seem to have clear contractures just occasional muscle spasms that can last some time.  See "hip pain" for full details.  Also filled out transportation assistance form.  She will need assistance with public transportation permanently.

## 2022-09-29 LAB — HEMOGLOBIN A1C
Est. average glucose Bld gHb Est-mCnc: 126 mg/dL
Hgb A1c MFr Bld: 6 % — ABNORMAL HIGH (ref 4.8–5.6)

## 2022-09-29 LAB — CBC
Hematocrit: 42.2 % (ref 34.0–46.6)
Hemoglobin: 14.3 g/dL (ref 11.1–15.9)
MCH: 30.2 pg (ref 26.6–33.0)
MCHC: 33.9 g/dL (ref 31.5–35.7)
MCV: 89 fL (ref 79–97)
Platelets: 294 10*3/uL (ref 150–450)
RBC: 4.73 x10E6/uL (ref 3.77–5.28)
RDW: 12.7 % (ref 11.7–15.4)
WBC: 7.9 10*3/uL (ref 3.4–10.8)

## 2022-09-29 LAB — TSH: TSH: 0.8 u[IU]/mL (ref 0.450–4.500)

## 2022-09-29 LAB — VITAMIN D 25 HYDROXY (VIT D DEFICIENCY, FRACTURES): Vit D, 25-Hydroxy: <4 ng/mL — ABNORMAL LOW (ref 30.0–100.0)

## 2022-09-29 NOTE — Progress Notes (Signed)
Internal Medicine Clinic Attending  I saw and evaluated the patient.  I personally confirmed the key portions of the history and exam documented by Dr. Marjie Skiff and I reviewed pertinent patient test results.  The assessment, diagnosis, and plan were formulated together and I agree with the documentation in the resident's note.   Vitamin D levels undetectable despite 1000 daily supplementation. Will need to try 50,000 unit once weekly treatment for 8 weeks and recheck. I wonder if she has malabsorption causing this severe deficiency.

## 2022-09-30 MED ORDER — VITAMIN D (ERGOCALCIFEROL) 1.25 MG (50000 UNIT) PO CAPS: 50000.0000 [IU] | ORAL_CAPSULE | ORAL | 0 refills | Status: AC

## 2022-09-30 NOTE — Addendum Note (Signed)
Addended byLyndle Herrlich on: 09/30/2022 04:21 PM   Modules accepted: Orders

## 2022-10-12 ENCOUNTER — Telehealth: Payer: Self-pay

## 2022-10-12 NOTE — Telephone Encounter (Signed)
Prior Authorization for patient [name]) came through on cover my meds was submitted with last office notes awaiting approval or denial

## 2022-10-12 NOTE — Telephone Encounter (Signed)
Decision:Denied ULTRAM TAB 50MG  is not covered. The requested medication with the submitted Generic Product Identifier (GPI)/National Drug Code [facility]) is not properly listed with the Food and Drug Administration (FDA) and does not meet regulatory requirements that would constitute a Part D-eligible drug. Therefore, the medication is not covered under your Part D prescription drug plan. Reviewed by: [id number], R.Ph

## 2022-10-20 MED ORDER — TRAMADOL HCL 50 MG PO TABS: 50.0000 mg | ORAL_TABLET | Freq: Four times a day (QID) | ORAL | 2 refills | Status: AC | PRN

## 2022-12-25 ENCOUNTER — Encounter: Payer: Self-pay | Admitting: Internal Medicine

## 2022-12-25 ENCOUNTER — Other Ambulatory Visit: Payer: Self-pay | Admitting: *Deleted

## 2022-12-25 ENCOUNTER — Ambulatory Visit (INDEPENDENT_AMBULATORY_CARE_PROVIDER_SITE_OTHER): Payer: Medicare Other | Admitting: Internal Medicine

## 2022-12-25 VITALS — BP 137/79 | HR 66 | Ht 65.0 in | Wt 143.5 lb

## 2022-12-25 DIAGNOSIS — E559 Vitamin D deficiency, unspecified: Secondary | ICD-10-CM

## 2022-12-25 DIAGNOSIS — Z0000 Encounter for general adult medical examination without abnormal findings: Secondary | ICD-10-CM

## 2022-12-25 DIAGNOSIS — G809 Cerebral palsy, unspecified: Secondary | ICD-10-CM | POA: Diagnosis not present

## 2022-12-25 DIAGNOSIS — I10 Essential (primary) hypertension: Secondary | ICD-10-CM | POA: Diagnosis not present

## 2022-12-25 DIAGNOSIS — Z1211 Encounter for screening for malignant neoplasm of colon: Secondary | ICD-10-CM | POA: Diagnosis not present

## 2022-12-25 DIAGNOSIS — E785 Hyperlipidemia, unspecified: Secondary | ICD-10-CM

## 2022-12-25 MED ORDER — METOPROLOL SUCCINATE ER 25 MG PO TB24: ORAL_TABLET | ORAL | 3 refills | Status: AC

## 2022-12-25 MED ORDER — ATORVASTATIN CALCIUM 40 MG PO TABS: 40.0000 mg | ORAL_TABLET | Freq: Every day | ORAL | 1 refills | Status: AC

## 2022-12-25 NOTE — Progress Notes (Signed)
CC: routine visit  HPI:  [name] is a 57 y.o. female living with a history stated below and presents today for a routine office visit. Please see problem based assessment and plan for additional details.  Past Medical History:  Diagnosis Date   Bipolar disorder ([place])     Followed by mental health, who prescribe her bipolar meds   Bradycardia 06/20/2011   Breast abscess 05/07/2015   Cerebral palsy ([place])     with history of neuropathic pain  and spasticity,  requiring long-term pain medications   Chest pain at rest 10/03/2013   Coronary artery disease 02/2008   a. s/p INF STEMI 5/09 tx with BMS to CFX;  b. Lexiscan Myoview 12/10 -  low risk, no ischemia.;   c. s/p NSTEMI 8/12: cath with LM 10%, LAD lum. irregs., oOM1 40%, mOM2 40%, mCFX stent occluded, RCA 50%, EF 45-50%; PCI - s/p DES to mCFX  d. Patent LCx stent, diffuse disease up to 50% in small nondominant RCA, 80% ostial stenosis in small OM3.    Frontal headache 11/21/2011   GERD (gastroesophageal reflux disease)    History of pelvic mass    Found on CT abd/ pelvis (04/2008) - 13.3 cm cystic pelvis mass, possibly left ovarian origin. To be followed at [facility].    HLD (hyperlipidemia)    Hypertension    Prediabetes 11/21/2011   Vitamin D deficiency 11/2009    patient treated with high-dose vitamin D weekly x3 months,  levels were rechecked and it was determined that the patient continued to require high dose repletion of vitamin D.    Current Outpatient Medications on File Prior to Visit  Medication Sig Dispense Refill   albuterol (PROVENTIL HFA;VENTOLIN HFA) 108 (90 Base) MCG/ACT inhaler Inhale 2 puffs into the lungs every 6 (six) hours as needed for wheezing or shortness of breath. 1 Inhaler 0   aspirin 81 MG tablet Take 81 mg by mouth daily.       nitroGLYCERIN (NITROSTAT) 0.4 MG SL tablet Place 1 tablet (0.4 mg total) under the tongue every 5 (five) minutes as needed for chest pain. 10 tablet 0   pantoprazole  (PROTONIX) 40 MG tablet TAKE 1 TABLET(40 MG) BY MOUTH DAILY 90 tablet 3   traMADol (ULTRAM) 50 MG tablet Take 1 tablet (50 mg total) by mouth every 6 (six) hours as needed. 30 tablet 2   Vitamin D, Ergocalciferol, (DRISDOL) 1.25 MG (50000 UNIT) CAPS capsule Take 1 capsule (50,000 Units total) by mouth every 7 (seven) days. 8 capsule 0   No current facility-administered medications on file prior to visit.    Family History  Problem Relation Age of Onset   Lung disease Mother 63       Died of respiratory failure   Heart attack Father 37    Social History   Socioeconomic History   Marital status: Divorced    Spouse name: Not on file   Number of children: Not on file   Years of education: Not on file   Highest education level: Not on file  Occupational History   Not on file  Tobacco Use   Smoking status: Every Day    Packs/day: 0.50    Years: 42.00    Additional pack years: 0.00    Total pack years: 21.00    Types: Cigarettes    Start date: 12/08/1982   Smokeless tobacco: Never   Tobacco comments:    Sometimes more. 1/2PPS  Substance and Sexual Activity  Alcohol use: Yes    Alcohol/week: 0.0 standard drinks of alcohol    Comment: Rarely.   Drug use: No   Sexual activity: Not on file  Other Topics Concern   Not on file  Social History Narrative   Not on file   Social Determinants of Health   Financial Resource Strain: Not on file  Food Insecurity: Not on file  Transportation Needs: Not on file  Physical Activity: Not on file  Stress: Not on file  Social Connections: Not on file  Intimate Partner Violence: Not on file    Review of Systems: ROS negative except for what is noted on the assessment and plan.  Vitals:   12/25/22 1032  BP: 137/79  Pulse: 66  SpO2: 100%  Weight: 143 lb 8 oz (65.1 kg)  Height: 5\' 5"  (1.651 m)    Physical Exam: Constitutional: well-appearing middle aged sitting in wheelchair, in no acute distress Cardiovascular: regular rate and  rhythm, no m/r/g Pulmonary/Chest: normal work of breathing on room air, lungs clear to auscultation bilaterally Skin: warm and dry Psych: normal mood and behavior  Assessment & Plan:    Patient discussed with Dr. Cain Sieve  Cerebral palsy [facility]) [name] is a 57 year old female with cerebral palsy.  She uses a wheelchair to ambulate, as she is unable to ambulate with the assistance of a cane or walker.  She has muscle spasms because of her CP, and occasionally has to take tramadol or Tylenol arthritis for this.  She has had her current wheelchair for 12 years and it is falling apart, thus she needs a new one.  The patient's mobility limitation impairs her ability to participate in activities, such as toileting, feeding, dressing, and bathing.  Her mobility limitation cannot be resolved by the use of a walker or a cane.  She is able to safely use a manual wheelchair, and has been able to do so for the last 12 years.  She is able to maneuver the chair independently and the patient's functional mobility can be resolved by the use of a manual wheelchair.  Additionally, the patient has the strength to maneuver the wheelchair on her own.  Plan: -DME order for manual wheelchair with leg cushions  Essential hypertension BP Readings from Last 3 Encounters:  12/25/22 137/79  09/28/22 121/89  02/20/22 131/72   Blood pressure is well-controlled on metoprolol 12.5 mg daily.  She denies any headaches, lightheadedness, vision changes, chest pain, palpitations, or shortness of breath.  Will continue current regimen.  Dyslipidemia Last lipid panel in May showed LDL of 161.  She was previously on Crestor 20 mg daily, but was not able to tolerate it due to diarrhea.  She was switched to Lipitor 40 mg daily and has been tolerating this much better.  Vitamin D deficiency Vitamin D level was undetectable at her last office visit in December.  She was prescribed vitamin D 50,000 units weekly for 8  weeks.  Plan: -Recheck vitamin D level today - Will need to assess for causes of malabsorption if it remains critically low  Healthcare maintenance - Mammogram was ordered back in July, but patient has not called to schedule this yet (states she has the information and phone number to do so)  - Declined colonoscopy, but agreed to FIT test (if positive may need to admit patient for colonoscopy, as with her CP, she is not able to get to the bathroom quickly and thus, cannot tolerate the bowel prep)  -Discussed lung cancer  screening however her risk of developing lung cancer in the next 5 years is 1.29% (calculated based on 0.5 pack/year history x 42 years with [email or web address]) -will defer low-dose CT at this time   Cleon Thoma, D.O. [facility], PGY-2 Phone: [phone] Date 12/25/2022 Time 11:22 AM

## 2022-12-25 NOTE — Assessment & Plan Note (Addendum)
-   Mammogram was ordered back in July, but patient has not called to schedule this yet (states she has the information and phone number to do so)  - Declined colonoscopy, but agreed to FIT test (if positive may need to admit patient for colonoscopy, as with her CP, she is not able to get to the bathroom quickly and thus, cannot tolerate the bowel prep)  -Discussed lung cancer screening however her risk of developing lung cancer in the next 5 years is 1.29% (calculated based on 0.5 pack/year history x 42 years with [email or web address]) -will defer low-dose CT at this time

## 2022-12-25 NOTE — Assessment & Plan Note (Addendum)
[name] is a 57 year old female with cerebral palsy.  She uses a wheelchair to ambulate, as she is unable to ambulate with the assistance of a cane or walker.  She has muscle spasms because of her CP, and occasionally has to take tramadol or Tylenol arthritis for this.  She has had her current wheelchair for 12 years and it is falling apart, thus she needs a new one.  The patient's mobility limitation impairs her ability to participate in activities, such as toileting, feeding, dressing, and bathing.  Her mobility limitation cannot be resolved by the use of a walker or a cane.  She is able to safely use a manual wheelchair, and has been able to do so for the last 12 years.  She is able to maneuver the chair independently and the patient's functional mobility can be resolved by the use of a manual wheelchair.  Additionally, the patient has the strength to maneuver the wheelchair on her own.  Plan: -DME order for manual wheelchair with leg cushions

## 2022-12-25 NOTE — Assessment & Plan Note (Signed)
Vitamin D level was undetectable at her last office visit in December.  She was prescribed vitamin D 50,000 units weekly for 8 weeks.  Plan: -Recheck vitamin D level today - Will need to assess for causes of malabsorption if it remains critically low

## 2022-12-25 NOTE — Progress Notes (Signed)
Internal Medicine Clinic Attending ° °Case discussed with Dr. Atway  At the time of the visit.  We reviewed the resident’s history and exam and pertinent patient test results.  I agree with the assessment, diagnosis, and plan of care documented in the resident’s note.  °

## 2022-12-25 NOTE — Patient Instructions (Signed)
Thank you, [name] for allowing [place] to provide your care today. Today we discussed:  Wheelchair: I am submitting new paperwork to get you a new wheelchair  with leg cushions  Keep taking metoprolol for blood pressure  We are rechecking your vitamin D level and I will call you if you need to restart taking vitamin D  You should do the stool test for colon cancer screening, and likely just discussed, if it is positive you will need to do a colonoscopy  Please call to schedule your mammogram  I have ordered the following labs for you:  Lab Orders         Fecal occult blood, imunochemical         Vitamin D (25 hydroxy)      Tests ordered today:  Mammogram  Referrals ordered today:   Referral Orders         Ambulatory Referral for DME       I have ordered the following medication/changed the following medications:   Stop the following medications: Medications Discontinued During This Encounter  Medication Reason   metoprolol succinate (TOPROL-XL) 25 MG 24 hr tablet Reorder   atorvastatin (LIPITOR) 40 MG tablet Reorder     Start the following medications: Meds ordered this encounter  Medications   metoprolol succinate (TOPROL-XL) 25 MG 24 hr tablet    Sig: TAKE 1/2 TABLET(12.5 MG) BY MOUTH DAILY    Dispense:  45 tablet    Refill:  3   atorvastatin (LIPITOR) 40 MG tablet    Sig: Take 1 tablet (40 mg total) by mouth daily.    Dispense:  90 tablet    Refill:  1     Follow up: 6 months    Should you have any questions or concerns please call the internal medicine clinic at [phone].     Buddy Duty, D.O. [place]

## 2022-12-25 NOTE — Assessment & Plan Note (Signed)
BP Readings from Last 3 Encounters:  12/25/22 137/79  09/28/22 121/89  02/20/22 131/72   Blood pressure is well-controlled on metoprolol 12.5 mg daily.  She denies any headaches, lightheadedness, vision changes, chest pain, palpitations, or shortness of breath.  Will continue current regimen.

## 2022-12-25 NOTE — Assessment & Plan Note (Signed)
Last lipid panel in May showed LDL of 161.  She was previously on Crestor 20 mg daily, but was not able to tolerate it due to diarrhea.  She was switched to Lipitor 40 mg daily and has been tolerating this much better.

## 2022-12-26 ENCOUNTER — Other Ambulatory Visit: Payer: Self-pay | Admitting: Internal Medicine

## 2022-12-26 DIAGNOSIS — Z1231 Encounter for screening mammogram for malignant neoplasm of breast: Secondary | ICD-10-CM

## 2022-12-26 LAB — VITAMIN D 25 HYDROXY (VIT D DEFICIENCY, FRACTURES): Vit D, 25-Hydroxy: 38.4 ng/mL (ref 30.0–100.0)

## 2022-12-26 NOTE — Progress Notes (Signed)
Vit D level WNL now and I advised patient she no longer needs to take vit D supplement. We did discuss increasing dietary sources of vitamin D instead.

## 2023-01-18 DIAGNOSIS — G809 Cerebral palsy, unspecified: Secondary | ICD-10-CM | POA: Diagnosis not present

## 2023-02-17 DIAGNOSIS — G809 Cerebral palsy, unspecified: Secondary | ICD-10-CM | POA: Diagnosis not present

## 2023-03-06 ENCOUNTER — Telehealth: Payer: Self-pay | Admitting: *Deleted

## 2023-03-06 NOTE — Telephone Encounter (Signed)
Call to patient to set up appointment for Mammogram.  Patient staets is waiting until grandchild is out of school for the summer. Will schedule herself.

## 2023-03-20 DIAGNOSIS — G809 Cerebral palsy, unspecified: Secondary | ICD-10-CM | POA: Diagnosis not present

## 2023-04-19 DIAGNOSIS — G809 Cerebral palsy, unspecified: Secondary | ICD-10-CM | POA: Diagnosis not present

## 2023-05-17 ENCOUNTER — Telehealth: Payer: Self-pay | Admitting: *Deleted

## 2023-05-17 NOTE — Telephone Encounter (Signed)
Unable to leave message for patient regarding her mammogram / voice mail not set up yet. Appointment August 29.2024 @3 :20 pm. Appointment letter mailed with information regarding the $75 no show fee. Patient  aware.

## 2023-05-20 DIAGNOSIS — G809 Cerebral palsy, unspecified: Secondary | ICD-10-CM | POA: Diagnosis not present

## 2023-06-07 ENCOUNTER — Ambulatory Visit: Payer: Medicare Other

## 2023-07-03 ENCOUNTER — Ambulatory Visit: Payer: Medicare Other

## 2023-07-18 ENCOUNTER — Telehealth: Payer: Self-pay | Admitting: *Deleted

## 2023-07-18 ENCOUNTER — Ambulatory Visit: Payer: Medicare Other

## 2023-07-18 VITALS — Ht 65.0 in | Wt 143.0 lb

## 2023-07-18 DIAGNOSIS — Z0000 Encounter for general adult medical examination without abnormal findings: Secondary | ICD-10-CM | POA: Diagnosis not present

## 2023-07-18 NOTE — Patient Instructions (Addendum)
Ms. Yearwood , Thank you for taking time to come for your Medicare Wellness Visit. I appreciate your ongoing commitment to your health goals. Please review the following plan we discussed and let me know if I can assist you in the future.   Referrals/Orders/Follow-Ups/Clinician Recommendations: You are due for a Mammogram, please call [facility] at [phone], to schedule an appointment.  Remember to please take the test and send it back for the colon screening.  Also you are eligible for a lung cancer screening.  Medicare pays for that (FYI).  It was nice speaking to you today.    This is a list of the screening recommended for you and due dates:  Health Maintenance  Topic Date Due   Stool Blood Test  Never done   Cologuard (Stool DNA test)  Never done   Screening for Lung Cancer  Never done   Zoster (Shingles) Vaccine (1 of 2) Never done   Mammogram  05/31/2018   DTaP/Tdap/Td vaccine (2 - Td or Tdap) 02/14/2022   Flu Shot  05/10/2023   COVID-19 Vaccine (1 - 2023-24 season) Never done   Medicare Annual Wellness Visit  07/17/2024   Hepatitis C Screening  Completed   HIV Screening  Completed   HPV Vaccine  Aged Out    Advanced directives: (Copy Requested) Please bring a copy of your health care power of attorney and living will to the office to be added to your chart at your convenience.  Next Medicare Annual Wellness Visit scheduled for next year: Yes

## 2023-07-18 NOTE — Telephone Encounter (Signed)
Spoke with patient regarding her mammogram / patient states she just spoke with her wellness nurse and that she will call to schedule her appointment around her daughter work schedule she keeps her grand son.

## 2023-07-18 NOTE — Progress Notes (Signed)
Subjective:   [name] is a 57 y.o. female who presents for an Initial Medicare Annual Wellness Visit.  Visit Complete: Virtual I connected with  [name] on 07/18/23 by a audio enabled telemedicine application and verified that I am speaking with the correct person using two identifiers.  Patient Location: Home  Provider Location: Home Office  I discussed the limitations of evaluation and management by telemedicine. The patient expressed understanding and agreed to proceed.  Vital Signs: Because this visit was a virtual/telehealth visit, some criteria may be missing or patient reported. Any vitals not documented were not able to be obtained and vitals that have been documented are patient reported.  Because this visit was a virtual/telehealth visit, some criteria may be missing or patient reported. Any vitals not documented were not able to be obtained and vitals that have been documented are patient reported.    Cardiac Risk Factors include: advanced age (>64men, >64 women);hypertension;dyslipidemia;Other (see comment), Risk factor comments: CAD, OSA, Cerebral palsy     Objective:    Today's Vitals   07/18/23 1304  Weight: 143 lb (64.9 kg)  Height: 5\' 5"  (1.651 m)   Body mass index is 23.8 kg/m.     07/18/2023    1:12 PM 12/25/2022   10:30 AM 02/20/2022    9:20 AM 06/15/2021    9:37 AM 05/20/2020    9:05 AM 12/12/2018   10:01 AM 05/30/2018    2:45 PM  Advanced Directives  Does Patient Have a Medical Advance Directive? No No No No No No No  Would patient like information on creating a medical advance directive?  No - Patient declined Yes (MAU/Ambulatory/Procedural Areas - Information given) No - Patient declined No - Patient declined Yes (MAU/Ambulatory/Procedural Areas - Information given) No - Patient declined    Current Medications (verified) Outpatient Encounter Medications as of 07/18/2023  Medication Sig   albuterol (PROVENTIL HFA;VENTOLIN HFA) 108 (90  Base) MCG/ACT inhaler Inhale 2 puffs into the lungs every 6 (six) hours as needed for wheezing or shortness of breath.   aspirin 81 MG tablet Take 81 mg by mouth daily.     atorvastatin (LIPITOR) 40 MG tablet Take 1 tablet (40 mg total) by mouth daily.   metoprolol succinate (TOPROL-XL) 25 MG 24 hr tablet TAKE 1/2 TABLET(12.5 MG) BY MOUTH DAILY   nitroGLYCERIN (NITROSTAT) 0.4 MG SL tablet Place 1 tablet (0.4 mg total) under the tongue every 5 (five) minutes as needed for chest pain.   pantoprazole (PROTONIX) 40 MG tablet TAKE 1 TABLET(40 MG) BY MOUTH DAILY (Patient taking differently: TAKE 1 TABLET(40 MG) BY MOUTH DAILY, AS NEEDED)   traMADol (ULTRAM) 50 MG tablet Take 1 tablet (50 mg total) by mouth every 6 (six) hours as needed.   Vitamin D, Ergocalciferol, (DRISDOL) 1.25 MG (50000 UNIT) CAPS capsule Take 1 capsule (50,000 Units total) by mouth every 7 (seven) days. (Patient not taking: Reported on 07/18/2023)   No facility-administered encounter medications on file as of 07/18/2023.    Allergies (verified) Penicillins   History: Past Medical History:  Diagnosis Date   Bipolar disorder (HCC)     Followed by mental health, who prescribe her bipolar meds   Bradycardia 06/20/2011   Breast abscess 05/07/2015   Cerebral palsy (HCC)     with history of neuropathic pain  and spasticity,  requiring long-term pain medications   Chest pain at rest 10/03/2013   Coronary artery disease 02/2008   a. s/p INF STEMI 5/09  tx with BMS to CFX;  b. Lexiscan Myoview 12/10 -  low risk, no ischemia.;   c. s/p NSTEMI 8/12: cath with LM 10%, LAD lum. irregs., oOM1 40%, mOM2 40%, mCFX stent occluded, RCA 50%, EF 45-50%; PCI - s/p DES to mCFX  d. Patent LCx stent, diffuse disease up to 50% in small nondominant RCA, 80% ostial stenosis in small OM3.    Frontal headache 11/21/2011   GERD (gastroesophageal reflux disease)    History of pelvic mass    Found on CT abd/ pelvis (04/2008) - 13.3 cm cystic pelvis mass,  possibly left ovarian origin. To be followed at [facility].    HLD (hyperlipidemia)    Hypertension    Prediabetes 11/21/2011   Vitamin D deficiency 11/2009    patient treated with high-dose vitamin D weekly x3 months,  levels were rechecked and it was determined that the patient continued to require high dose repletion of vitamin D.   Past Surgical History:  Procedure Laterality Date    elective abortion     x2   CESAREAN SECTION     x2   CORONARY ANGIOPLASTY WITH STENT PLACEMENT     LEFT HEART CATHETERIZATION WITH CORONARY ANGIOGRAM N/A 10/06/2011   Procedure: LEFT HEART CATHETERIZATION WITH CORONARY ANGIOGRAM;  Surgeon: Laurey Morale, MD;  Location: [facility] CATH LAB;  Service: Cardiovascular;  Laterality: N/A;   OTHER SURGICAL HISTORY      heel surgery, bilateral   TOTAL ABDOMINAL HYSTERECTOMY  03/2003    secondary to symptomatic fibroids.   TUBAL LIGATION     Family History  Problem Relation Age of Onset   Lung disease Mother 69       Died of respiratory failure   Heart attack Father 17   Social History   Socioeconomic History   Marital status: Divorced    Spouse name: Not on file   Number of children: 2   Years of education: Not on file   Highest education level: Not on file  Occupational History   Occupation: Disabled  Tobacco Use   Smoking status: Every Day    Current packs/day: 0.50    Average packs/day: 0.5 packs/day for 42.0 years (21.0 ttl pk-yrs)    Types: Cigarettes    Start date: 12/08/1982   Smokeless tobacco: Never   Tobacco comments:    Sometimes more. 1/2PPS  Vaping Use   Vaping status: Never Used  Substance and Sexual Activity   Alcohol use: Yes    Alcohol/week: 0.0 standard drinks of alcohol    Comment: Rarely.   Drug use: No   Sexual activity: Not on file  Other Topics Concern   Not on file  Social History Narrative    Her daughter and grandson lives with her.   Social Determinants of Health   Financial Resource Strain: Low Risk  (07/18/2023)    Overall Financial Resource Strain (CARDIA)    Difficulty of Paying Living Expenses: Not hard at all  Food Insecurity: No Food Insecurity (07/18/2023)   Hunger Vital Sign    Worried About Running Out of Food in the Last Year: Never true    Ran Out of Food in the Last Year: Never true  Transportation Needs: No Transportation Needs (07/18/2023)   PRAPARE - Administrator, Civil Service (Medical): No    Lack of Transportation (Non-Medical): No  Physical Activity: Inactive (07/18/2023)   Exercise Vital Sign    Days of Exercise per Week: 0 days    Minutes of  Exercise per Session: 0 min  Stress: No Stress Concern Present (07/18/2023)   [employer] of Occupational Health - Occupational Stress Questionnaire    Feeling of Stress : Not at all  Social Connections: Socially Isolated (07/18/2023)   Social Connection and Isolation Panel [NHANES]    Frequency of Communication with Friends and Family: More than three times a week    Frequency of Social Gatherings with Friends and Family: Never    Attends Religious Services: Never    Database administrator or Organizations: No    Attends Engineer, structural: Never    Marital Status: Divorced    Tobacco Counseling Ready to quit: Not Answered Counseling given: Not Answered Tobacco comments: Sometimes more. 1/2PPS   Clinical Intake:  Pre-visit preparation completed: Yes  Pain : No/denies pain     BMI - recorded: 23.8 Nutritional Status: BMI of 19-24  Normal Diabetes: No  How often do you need to have someone help you when you read instructions, pamphlets, or other written materials from your doctor or pharmacy?: 1 - Never  Interpreter Needed?: No  Information entered by :: Flint Hakeem, RMA   Activities of Daily Living    07/18/2023    1:06 PM 12/25/2022   10:30 AM  In your present state of health, do you have any difficulty performing the following activities:  Hearing? 0 0  Vision? 0 0  Difficulty  concentrating or making decisions? 0 0  Walking or climbing stairs? 1 1  Dressing or bathing? 0 0  Doing errands, shopping? 0 0  Comment Para transit (SCAT)   Preparing Food and eating ? N   Using the Toilet? N   In the past six months, have you accidently leaked urine? N   Do you have problems with loss of bowel control? N   Managing your Medications? N   Managing your Finances? N   Housekeeping or managing your Housekeeping? N     Patient Care Team: Chauncey Mann, DO as PCP - General  Indicate any recent Medical Services you may have received from other than [facility] providers in the past year (date may be approximate).     Assessment:   This is a routine wellness examination for [place].  Hearing/Vision screen Hearing Screening - Comments:: Denies hearing difficulties   Vision Screening - Comments:: Wears eyeglasses   Goals Addressed               This Visit's Progress     Patient Stated (pt-stated)        To keep her weight under control to help with her walking.      Depression Screen    07/18/2023    1:17 PM 12/25/2022   10:30 AM 02/20/2022    9:15 AM 06/15/2021    2:03 PM 05/20/2020   10:30 AM 12/12/2018   10:04 AM 05/30/2018    2:46 PM  PHQ 2/9 Scores  PHQ - 2 Score 0 0 0 2 2 0 2  PHQ- 9 Score 4   14 11  13     Fall Risk    07/18/2023    1:12 PM 12/25/2022   10:30 AM 02/20/2022    9:10 AM 06/15/2021    9:32 AM 05/20/2020    9:04 AM  Fall Risk   Falls in the past year? 1 1 1 1  0  Number falls in past yr: 1 1 1 1  0  Injury with Fall? 0 0 0 0 0  Risk  for fall due to :  Impaired balance/gait;Impaired mobility History of fall(s);Impaired balance/gait;Impaired mobility History of fall(s);Impaired balance/gait;Impaired mobility;Other (Comment) Impaired balance/gait  Risk for fall due to: Comment   Spasm and lost balance    Follow up Falls evaluation completed;Falls prevention discussed Falls evaluation completed;Falls prevention discussed Falls evaluation  completed;Falls prevention discussed Falls evaluation completed;Falls prevention discussed     MEDICARE RISK AT HOME: Medicare Risk at Home Any stairs in or around the home?: No Home free of loose throw rugs in walkways, pet beds, electrical cords, etc?: Yes Adequate lighting in your home to reduce risk of falls?: Yes Life alert?: No Use of a cane, walker or w/c?: Yes Grab bars in the bathroom?: No Shower chair or bench in shower?: No Elevated toilet seat or a handicapped toilet?: No  TIMED UP AND GO:  Was the test performed? No    Cognitive Function:        07/18/2023    1:14 PM  6CIT Screen  What Year? 0 points  What month? 0 points  What time? 0 points  Count back from 20 0 points  Months in reverse 4 points  Repeat phrase 0 points  Total Score 4 points    Immunizations Immunization History  Administered Date(s) Administered   Influenza Split 08/15/2012   Influenza Whole 06/29/2011   Influenza,inj,Quad PF,6+ Mos 11/12/2013   Pneumococcal Polysaccharide-23 02/19/2008   Tdap 02/15/2012    TDAP status: Due, Education has been provided regarding the importance of this vaccine. Advised may receive this vaccine at local pharmacy or Health Dept. Aware to provide a copy of the vaccination record if obtained from local pharmacy or Health Dept. Verbalized acceptance and understanding.  Flu Vaccine status: Declined, Education has been provided regarding the importance of this vaccine but patient still declined. Advised may receive this vaccine at local pharmacy or Health Dept. Aware to provide a copy of the vaccination record if obtained from local pharmacy or Health Dept. Verbalized acceptance and understanding.  Pneumococcal vaccine status: Declined,  Education has been provided regarding the importance of this vaccine but patient still declined. Advised may receive this vaccine at local pharmacy or Health Dept. Aware to provide a copy of the vaccination record if obtained  from local pharmacy or Health Dept. Verbalized acceptance and understanding.   Covid-19 vaccine status: Declined, Education has been provided regarding the importance of this vaccine but patient still declined. Advised may receive this vaccine at local pharmacy or Health Dept.or vaccine clinic. Aware to provide a copy of the vaccination record if obtained from local pharmacy or Health Dept. Verbalized acceptance and understanding.  Qualifies for Shingles Vaccine? Yes   Zostavax completed No   Shingrix Completed?: No.    Education has been provided regarding the importance of this vaccine. Patient has been advised to call insurance company to determine out of pocket expense if they have not yet received this vaccine. Advised may also receive vaccine at local pharmacy or Health Dept. Verbalized acceptance and understanding.  Screening Tests Health Maintenance  Topic Date Due   COLON CANCER SCREENING ANNUAL FOBT  Never done   Fecal DNA (Cologuard)  Never done   Lung Cancer Screening  Never done   Zoster Vaccines- Shingrix (1 of 2) Never done   MAMMOGRAM  05/31/2018   DTaP/Tdap/Td (2 - Td or Tdap) 02/14/2022   INFLUENZA VACCINE  05/10/2023   COVID-19 Vaccine (1 - 2023-24 season) Never done   Medicare Annual Wellness (AWV)  07/17/2024  Hepatitis C Screening  Completed   HIV Screening  Completed   HPV VACCINES  Aged Out    Health Maintenance  Health Maintenance Due  Topic Date Due   COLON CANCER SCREENING ANNUAL FOBT  Never done   Fecal DNA (Cologuard)  Never done   Lung Cancer Screening  Never done   Zoster Vaccines- Shingrix (1 of 2) Never done   MAMMOGRAM  05/31/2018   DTaP/Tdap/Td (2 - Td or Tdap) 02/14/2022   INFLUENZA VACCINE  05/10/2023   COVID-19 Vaccine (1 - 2023-24 season) Never done    Colorectal cancer screening: Referral to GI placed for a Cologuard. Pt aware the office will call re: appt. Patient has kit but has not taken test yet.  Mammogram status: Ordered  12/26/2022. Pt provided with contact info and advised to call to schedule appt.   Lung Cancer Screening: (Low Dose CT Chest recommended if Age 27-80 years, 20 pack-year currently smoking OR have quit w/in 15years.) does qualify.   Lung Cancer Screening Referral: Please advise.  Additional Screening:  Hepatitis C Screening: does qualify; Completed 12/14/2015  Vision Screening: Recommended annual ophthalmology exams for early detection of glaucoma and other disorders of the eye. Is the patient up to date with their annual eye exam?  No  Who is the provider or what is the name of the office in which the patient attends annual eye exams? N/A If pt is not established with a provider, would they like to be referred to a provider to establish care? Yes .   Dental Screening: Recommended annual dental exams for proper oral hygiene   Community Resource Referral / Chronic Care Management: CRR required this visit?  No   CCM required this visit?  No     Plan:     I have personally reviewed and noted the following in the patient's chart:   Medical and social history Use of alcohol, tobacco or illicit drugs  Current medications and supplements including opioid prescriptions. Patient is not currently taking opioid prescriptions. Functional ability and status Nutritional status Physical activity Advanced directives List of other physicians Hospitalizations, surgeries, and ER visits in previous 12 months Vitals Screenings to include cognitive, depression, and falls Referrals and appointments  In addition, I have reviewed and discussed with patient certain preventive protocols, quality metrics, and best practice recommendations. A written personalized care plan for preventive services as well as general preventive health recommendations were provided to patient.     Clive Parcel L Standley Bargo, CMA   07/18/2023   After Visit Summary: (MyChart) Due to this being a telephonic visit, the after visit  summary with patients personalized plan was offered to patient via MyChart   Nurse Notes: Patient is due for all vaccines, however, she declines to have any.  She has orders placed for a Mammogram and she is aware to call and schedule.  Patient has the Cologuard kit in her possesion but has not taken the test as of yet.  Patient is also eligible for a Lung cancer screening.  She had no other concerns to address today.

## 2023-08-13 ENCOUNTER — Telehealth: Payer: Self-pay | Admitting: *Deleted

## 2023-08-13 NOTE — Telephone Encounter (Signed)
Spoke with patient states she has cancelled appointment 09-12-2023 / patient states she will call breast center to schedule the latest appointment they have in the evening. Phone number given to breast center [phone].patient aware of the 75.00 no show fee charge.

## 2023-08-30 ENCOUNTER — Encounter: Payer: Medicare Other | Admitting: Internal Medicine

## 2023-09-12 ENCOUNTER — Ambulatory Visit: Payer: Medicare Other

## 2023-10-20 DIAGNOSIS — G809 Cerebral palsy, unspecified: Secondary | ICD-10-CM | POA: Diagnosis not present

## 2023-11-12 ENCOUNTER — Telehealth: Payer: Self-pay | Admitting: *Deleted

## 2023-11-12 NOTE — Telephone Encounter (Signed)
Spoke with patient regarding her mammogram,stated she canceled due to that she needs appt lil past 4 pm. But still would like to get her mammogram done. Will call mammogram office to check on the latest appointment to schedule,

## 2023-11-13 ENCOUNTER — Telehealth: Payer: Self-pay | Admitting: *Deleted

## 2023-11-13 NOTE — Telephone Encounter (Signed)
Spoke with patient regarding her mammogram , the office did get contact with her with information regarding the latest appointment time- 5 pm  patient stated she will be calling them back when she find out her daughters work schedule.

## 2023-11-20 DIAGNOSIS — G809 Cerebral palsy, unspecified: Secondary | ICD-10-CM | POA: Diagnosis not present

## 2023-12-05 ENCOUNTER — Telehealth: Payer: Self-pay | Admitting: *Deleted

## 2023-12-05 NOTE — Telephone Encounter (Signed)
 Spoke with patient mammogram appt; March 19.2025 @ 2:00 pm arrive 1:45 pm. Patient aware she to call breast [phone] to reschedule or cancel appt-failure to do so will be a 75.00 on show charge. Appointment mailed to the patient.

## 2023-12-18 ENCOUNTER — Other Ambulatory Visit: Payer: Self-pay | Admitting: Internal Medicine

## 2023-12-18 DIAGNOSIS — Z1231 Encounter for screening mammogram for malignant neoplasm of breast: Secondary | ICD-10-CM

## 2023-12-26 ENCOUNTER — Ambulatory Visit: Payer: Medicare Other

## 2024-01-08 ENCOUNTER — Ambulatory Visit
Admission: RE | Admit: 2024-01-08 | Discharge: 2024-01-08 | Disposition: A | Discharge status: Home / Self Care | Source: Ambulatory Visit | Attending: Internal Medicine

## 2024-01-08 DIAGNOSIS — Z1231 Encounter for screening mammogram for malignant neoplasm of breast: Secondary | ICD-10-CM

## 2024-01-10 NOTE — Progress Notes (Signed)
 No evidence of malignancy - will repeat mammogram in one year.

## 2024-07-23 ENCOUNTER — Ambulatory Visit: Payer: Medicare Other

## 2024-07-23 VITALS — Ht 65.0 in | Wt 145.0 lb

## 2024-07-23 DIAGNOSIS — Z0000 Encounter for general adult medical examination without abnormal findings: Secondary | ICD-10-CM

## 2024-07-23 NOTE — Progress Notes (Addendum)
 Subjective:   AZARAH DACY is a 58 y.o. who presents for a Medicare Wellness preventive visit.  As a reminder, Annual Wellness Visits don't include a physical exam, and some assessments may be limited, especially if this visit is performed virtually. We may recommend an in-person follow-up visit with your provider if needed.  Visit Complete: Virtual I connected with  Kymari Lollis Egli on 07/23/2024 by a audio enabled telemedicine application and verified that I am speaking with the correct person using two identifiers.  Patient Location: Home  Provider Location: Home Office  I discussed the limitations of evaluation and management by telemedicine. The patient expressed understanding and agreed to proceed.  Vital Signs: Because this visit was a virtual/telehealth visit, some criteria may be missing or patient reported. Any vitals not documented were not able to be obtained and vitals that have been documented are patient reported.  VideoDeclined- This patient declined Librarian, academic. Therefore the visit was completed with audio only.  Persons Participating in Visit: Patient.  AWV Questionnaire: No: Patient Medicare AWV questionnaire was not completed prior to this visit.  Cardiac Risk Factors include: advanced age (>4men, >73 women);family history of premature cardiovascular disease;sedentary lifestyle     Objective:    Today's Vitals   07/23/24 1304  Weight: 145 lb (65.8 kg)  Height: 5' 5 (1.651 m)  PainSc: 6   PainLoc: Generalized   Body mass index is 24.13 kg/m.     07/23/2024    1:15 PM 07/23/2024    1:06 PM 07/18/2023    1:12 PM 12/25/2022   10:30 AM 02/20/2022    9:20 AM 06/15/2021    9:37 AM 05/20/2020    9:05 AM  Advanced Directives  Does Patient Have a Medical Advance Directive? No No No No No No No  Would patient like information on creating a medical advance directive? No - Patient declined No - Patient declined  No -  Patient declined Yes (MAU/Ambulatory/Procedural Areas - Information given) No - Patient declined No - Patient declined    Current Medications (verified) Outpatient Encounter Medications as of 07/23/2024  Medication Sig   metoprolol  succinate (TOPROL -XL) 25 MG 24 hr tablet TAKE 1/2 TABLET(12.5 MG) BY MOUTH DAILY   albuterol  (PROVENTIL  HFA;VENTOLIN  HFA) 108 (90 Base) MCG/ACT inhaler Inhale 2 puffs into the lungs every 6 (six) hours as needed for wheezing or shortness of breath. (Patient not taking: Reported on 07/23/2024)   aspirin  81 MG tablet Take 81 mg by mouth daily.   (Patient not taking: Reported on 07/23/2024)   atorvastatin  (LIPITOR) 40 MG tablet Take 1 tablet (40 mg total) by mouth daily. (Patient not taking: Reported on 07/23/2024)   nitroGLYCERIN  (NITROSTAT ) 0.4 MG SL tablet Place 1 tablet (0.4 mg total) under the tongue every 5 (five) minutes as needed for chest pain. (Patient not taking: Reported on 07/23/2024)   pantoprazole  (PROTONIX ) 40 MG tablet TAKE 1 TABLET(40 MG) BY MOUTH DAILY (Patient not taking: Reported on 07/23/2024)   traMADol  (ULTRAM ) 50 MG tablet Take 1 tablet (50 mg total) by mouth every 6 (six) hours as needed. (Patient not taking: Reported on 07/23/2024)   Vitamin D , Ergocalciferol , (DRISDOL ) 1.25 MG (50000 UNIT) CAPS capsule Take 1 capsule (50,000 Units total) by mouth every 7 (seven) days. (Patient not taking: Reported on 07/23/2024)   No facility-administered encounter medications on file as of 07/23/2024.    Allergies (verified) Penicillins   History: Past Medical History:  Diagnosis Date   Bipolar disorder (  HCC)     Followed by mental health, who prescribe her bipolar meds   Bradycardia 06/20/2011   Breast abscess 05/07/2015   Cerebral palsy (HCC)     with history of neuropathic pain  and spasticity,  requiring long-term pain medications   Chest pain at rest 10/03/2013   Coronary artery disease 02/2008   a. s/p INF STEMI 5/09 tx with BMS to CFX;  b.  Lexiscan  Myoview  12/10 -  low risk, no ischemia.;   c. s/p NSTEMI 8/12: cath with LM 10%, LAD lum. irregs., oOM1 40%, mOM2 40%, mCFX stent occluded, RCA 50%, EF 45-50%; PCI - s/p DES to mCFX  d. Patent LCx stent, diffuse disease up to 50% in small nondominant RCA, 80% ostial stenosis in small OM3.    Frontal headache 11/21/2011   GERD (gastroesophageal reflux disease)    History of pelvic mass    Found on CT abd/ pelvis (04/2008) - 13.3 cm cystic pelvis mass, possibly left ovarian origin. To be followed at [facility].    HLD (hyperlipidemia)    Hypertension    Prediabetes 11/21/2011   Vitamin D  deficiency 11/2009    patient treated with high-dose vitamin D  weekly x3 months,  levels were rechecked and it was determined that the patient continued to require high dose repletion of vitamin D .   Past Surgical History:  Procedure Laterality Date    elective abortion     x2   CESAREAN SECTION     x2   CORONARY ANGIOPLASTY WITH STENT PLACEMENT     LEFT HEART CATHETERIZATION WITH CORONARY ANGIOGRAM N/A 10/06/2011   Procedure: LEFT HEART CATHETERIZATION WITH CORONARY ANGIOGRAM;  Surgeon: Ezra GORMAN Shuck, MD;  Location: [facility] CATH LAB;  Service: Cardiovascular;  Laterality: N/A;   OTHER SURGICAL HISTORY      heel surgery, bilateral   TOTAL ABDOMINAL HYSTERECTOMY  03/2003    secondary to symptomatic fibroids.   TUBAL LIGATION     Family History  Problem Relation Age of Onset   Lung disease Mother 30       Died of respiratory failure   Heart attack Father 19   Social History   Socioeconomic History   Marital status: Divorced    Spouse name: Not on file   Number of children: 2   Years of education: Not on file   Highest education level: Not on file  Occupational History   Occupation: Disabled  Tobacco Use   Smoking status: Every Day    Current packs/day: 0.50    Average packs/day: 0.5 packs/day for 43.2 years (21.6 ttl pk-yrs)    Types: Cigarettes    Start date: 12/08/1982   Smokeless tobacco:  Never   Tobacco comments:    Sometimes more. 1/2PPS  Vaping Use   Vaping status: Never Used  Substance and Sexual Activity   Alcohol use: Yes    Alcohol/week: 0.0 standard drinks of alcohol    Comment: Rarely.   Drug use: No   Sexual activity: Not on file  Other Topics Concern   Not on file  Social History Narrative    Her daughter and grandson lives with her.   Social Drivers of Corporate Investment Banker Strain: Low Risk  (07/23/2024)   Overall Financial Resource Strain (CARDIA)    Difficulty of Paying Living Expenses: Not hard at all  Food Insecurity: No Food Insecurity (07/23/2024)   Hunger Vital Sign    Worried About Running Out of Food in the Last Year: Never true  Ran Out of Food in the Last Year: Never true  Transportation Needs: No Transportation Needs (07/23/2024)   PRAPARE - Administrator, Civil Service (Medical): No    Lack of Transportation (Non-Medical): No  Physical Activity: Inactive (07/23/2024)   Exercise Vital Sign    Days of Exercise per Week: 0 days    Minutes of Exercise per Session: 0 min  Stress: No Stress Concern Present (07/23/2024)   [employer] of Occupational Health - Occupational Stress Questionnaire    Feeling of Stress: Not at all  Social Connections: Socially Isolated (07/23/2024)   Social Connection and Isolation Panel    Frequency of Communication with Friends and Family: More than three times a week    Frequency of Social Gatherings with Friends and Family: Never    Attends Religious Services: Never    Database Administrator or Organizations: No    Attends Engineer, Structural: Never    Marital Status: Divorced    Tobacco Counseling Ready to quit: No Counseling given: Not Answered Tobacco comments: Sometimes more. 1/2PPS    Clinical Intake:  Pre-visit preparation completed: Yes  Pain : 0-10 Pain Score: 6  Pain Type: Chronic pain Pain Location: Generalized Pain Onset: More than a month  ago Pain Frequency: Constant     BMI - recorded: 24.13 Nutritional Status: BMI of 19-24  Normal Nutritional Risks: None Diabetes: No  Lab Results  Component Value Date   HGBA1C 6.0 (H) 09/28/2022   HGBA1C 5.6 05/30/2018   HGBA1C 5.6 03/08/2017     How often do you need to have someone help you when you read instructions, pamphlets, or other written materials from your doctor or pharmacy?: 1 - Never What is the last grade level you completed in school?: HSG  Interpreter Needed?: No  Information entered by :: Roz Fuller, LPN.   Activities of Daily Living     07/23/2024    1:09 PM  In your present state of health, do you have any difficulty performing the following activities:  Hearing? 0  Vision? 0  Difficulty concentrating or making decisions? 0  Comment BSE: GAMES, SPADES, READING  Walking or climbing stairs? 1  Dressing or bathing? 0  Doing errands, shopping? 1  Comment TRANSPORTATION  Preparing Food and eating ? N  Using the Toilet? N  In the past six months, have you accidently leaked urine? Y  Do you have problems with loss of bowel control? N  Managing your Medications? N  Managing your Finances? N  Housekeeping or managing your Housekeeping? N    Patient Care Team: Azadegan, Maryam, MD as PCP - General  I have updated your Care Teams any recent Medical Services you may have received from other providers in the past year.     Assessment:   This is a routine wellness examination for Mckinsley.  Hearing/Vision screen Hearing Screening - Comments:: Denies hearing difficulties.  Vision Screening - Comments:: Wears rx glasses - Not up to date with routine eye exams with [facility]; Defer to PCP    Goals Addressed             This Visit's Progress    07/23/2024       To relocate to a safer area to live.       Depression Screen     07/23/2024    1:12 PM 07/18/2023    1:17 PM 12/25/2022   10:30 AM 02/20/2022    9:15 AM 06/15/2021  2:03  PM 05/20/2020   10:30 AM 12/12/2018   10:04 AM  PHQ 2/9 Scores  PHQ - 2 Score 0 0 0 0 2 2 0  PHQ- 9 Score 1  4    14  11        Data saved with a previous flowsheet row definition    Fall Risk     07/23/2024    1:15 PM 07/23/2024    1:08 PM 07/18/2023    1:12 PM 12/25/2022   10:30 AM 02/20/2022    9:10 AM  Fall Risk   Falls in the past year? 0 1 1 1 1   Number falls in past yr: 1 1 1 1 1   Injury with Fall? 1  0  0  0  0   Risk for fall due to : History of fall(s);Impaired balance/gait;Impaired mobility;Orthopedic patient History of fall(s);Impaired balance/gait;Orthopedic patient  Impaired balance/gait;Impaired mobility History of fall(s);Impaired balance/gait;Impaired mobility  Risk for fall due to: Comment     Spasm and lost balance  Follow up Falls evaluation completed;Education provided Falls evaluation completed;Education provided Falls evaluation completed;Falls prevention discussed Falls evaluation completed;Falls prevention discussed Falls evaluation completed;Falls prevention discussed      Data saved with a previous flowsheet row definition    MEDICARE RISK AT HOME:  Medicare Risk at Home Any stairs in or around the home?: No If so, are there any without handrails?: No Home free of loose throw rugs in walkways, pet beds, electrical cords, etc?: Yes Adequate lighting in your home to reduce risk of falls?: Yes Life alert?: No Use of a cane, walker or w/c?: Yes (WALKER, WHEELCHAIR) Grab bars in the bathroom?: No Shower chair or bench in shower?: No Elevated toilet seat or a handicapped toilet?: No  TIMED UP AND GO:  Was the test performed?  No  Cognitive Function: Declined/Normal: No cognitive concerns noted by patient or family. Patient alert, oriented, able to answer questions appropriately and recall recent events. No signs of memory loss or confusion.    07/23/2024    1:12 PM  MMSE - Mini Mental State Exam  Not completed: Unable to complete        07/23/2024     1:15 PM 07/18/2023    1:14 PM  6CIT Screen  What Year? 0 points 0 points  What month? 0 points 0 points  What time? 0 points 0 points  Count back from 20 0 points 0 points  Months in reverse 0 points 4 points  Repeat phrase 0 points 0 points  Total Score 0 points 4 points    Immunizations Immunization History  Administered Date(s) Administered   Influenza Split 08/15/2012   Influenza Whole 06/29/2011   Influenza,inj,Quad PF,6+ Mos 11/12/2013   Pneumococcal Polysaccharide-23 02/19/2008   Tdap 02/15/2012    Screening Tests Health Maintenance  Topic Date Due   Pneumococcal Vaccine: 50+ Years (2 of 2 - PCV) 02/18/2009   COLON CANCER SCREENING ANNUAL FOBT  Never done   Fecal DNA (Cologuard)  Never done   Lung Cancer Screening  Never done   Zoster Vaccines- Shingrix (1 of 2) Never done   DTaP/Tdap/Td (2 - Td or Tdap) 02/14/2022   Influenza Vaccine  05/09/2024   COVID-19 Vaccine (1 - 2025-26 season) Never done   Medicare Annual Wellness (AWV)  07/23/2025   Mammogram  01/07/2026   Hepatitis C Screening  Completed   HIV Screening  Completed   Hepatitis B Vaccines 19-59 Average Risk  Aged Out  HPV VACCINES  Aged Out   Meningococcal B Vaccine  Aged Out    Health Maintenance Items Addressed: Yes Vaccines Due: Patient declined vaccines, Patient is overdue for FOBT and Lung Cancer Screening  Additional Screening:  Vision Screening: Recommended annual ophthalmology exams for early detection of glaucoma and other disorders of the eye. Is the patient up to date with their annual eye exam?  No  Who is the provider or what is the name of the office in which the patient attends annual eye exams? Defer to PCP  Dental Screening: Recommended annual dental exams for proper oral hygiene  Community Resource Referral / Chronic Care Management: CRR required this visit?  No   CCM required this visit?  No   Plan:    I have personally reviewed and noted the following in the patient's  chart:   Medical and social history Use of alcohol, tobacco or illicit drugs  Current medications and supplements including opioid prescriptions. Patient is not currently taking opioid prescriptions. Functional ability and status Nutritional status Physical activity Advanced directives List of other physicians Hospitalizations, surgeries, and ER visits in previous 12 months Vitals Screenings to include cognitive, depression, and falls Referrals and appointments  In addition, I have reviewed and discussed with patient certain preventive protocols, quality metrics, and best practice recommendations. A written personalized care plan for preventive services as well as general preventive health recommendations were provided to patient.   Roz LOISE Fuller, LPN   [id number]  After Visit Summary: (MyChart) Due to this being a telephonic visit, the after visit summary with patients personalized plan was offered to patient via MyChart   Notes: Patient declined all vaccines.  Patient was advised to call office to schedule an appointment, Patient stated that she is having issues with finances right (she can't afford her medication).  Internal Medicine Attending:  I reviewed the AWV findings of the medical professional who conducted the visit. I was present in the office suite and immediately available to provide assistance and direction throughout the time the service was provided.

## 2024-07-23 NOTE — Patient Instructions (Signed)
 [name],  Thank you for taking the time for your Medicare Wellness Visit. I appreciate your continued commitment to your health goals. Please review the care plan we discussed, and feel free to reach out if I can assist you further.  Medicare recommends these wellness visits once per year to help you and your care team stay ahead of potential health issues. These visits are designed to focus on prevention, allowing your provider to concentrate on managing your acute and chronic conditions during your regular appointments.  Please note that Annual Wellness Visits do not include a physical exam. Some assessments may be limited, especially if the visit was conducted virtually. If needed, we may recommend a separate in-person follow-up with your provider.  Ongoing Care Seeing your primary care provider every 3 to 6 months helps us  monitor your health and provide consistent, personalized care.   Referrals If a referral was made during today's visit and you haven't received any updates within two weeks, please contact the referred provider directly to check on the status.  Recommended Screenings:  Health Maintenance  Topic Date Due   Hepatitis B Vaccine (1 of 3 - 19+ 3-dose series) Never done   Pneumococcal Vaccine for age over 31 (2 of 2 - PCV) 02/18/2009   Stool Blood Test  Never done   Cologuard (Stool DNA test)  Never done   Screening for Lung Cancer  Never done   Zoster (Shingles) Vaccine (1 of 2) Never done   DTaP/Tdap/Td vaccine (2 - Td or Tdap) 02/14/2022   Flu Shot  05/09/2024   COVID-19 Vaccine (1 - 2025-26 season) Never done   Medicare Annual Wellness Visit  07/23/2025   Breast Cancer Screening  01/07/2026   Hepatitis C Screening  Completed   HIV Screening  Completed   HPV Vaccine  Aged Out   Meningitis B Vaccine  Aged Out       07/23/2024    1:06 PM  Advanced Directives  Does Patient Have a Medical Advance Directive? No  Would patient like information on creating a  medical advance directive? No - Patient declined   Advance Care Planning is important because it: Ensures you receive medical care that aligns with your values, goals, and preferences. Provides guidance to your family and loved ones, reducing the emotional burden of decision-making during critical moments.  Vision: Annual vision screenings are recommended for early detection of glaucoma, cataracts, and diabetic retinopathy. These exams can also reveal signs of chronic conditions such as diabetes and high blood pressure.  Dental: Annual dental screenings help detect early signs of oral cancer, gum disease, and other conditions linked to overall health, including heart disease and diabetes.  Please see the attached documents for additional preventive care recommendations.
